# Patient Record
Sex: Female | Born: 1995 | Race: Black or African American | Hispanic: No | Marital: Single | Smoking: Current every day smoker
Health system: Southern US, Community
[De-identification: ages and names within clinical notes are randomized; demographics above are authoritative.]

## PROBLEM LIST (undated history)

## (undated) DIAGNOSIS — F1990 Other psychoactive substance use, unspecified, uncomplicated: Secondary | ICD-10-CM

---

## 2018-02-08 ENCOUNTER — Inpatient Hospital Stay (HOSPITAL_COMMUNITY)
Admission: EM | Admit: 2018-02-08 | Discharge: 2018-03-26 | DRG: 871 | Disposition: A | Discharge status: Home / Self Care | Payer: Self-pay | Attending: Family Medicine | Admitting: Family Medicine

## 2018-02-08 ENCOUNTER — Emergency Department (HOSPITAL_COMMUNITY): Payer: Self-pay

## 2018-02-08 ENCOUNTER — Encounter (HOSPITAL_COMMUNITY): Payer: Self-pay | Admitting: Emergency Medicine

## 2018-02-08 ENCOUNTER — Other Ambulatory Visit: Payer: Self-pay

## 2018-02-08 DIAGNOSIS — I2690 Septic pulmonary embolism without acute cor pulmonale: Secondary | ICD-10-CM | POA: Diagnosis present

## 2018-02-08 DIAGNOSIS — Y92230 Patient room in hospital as the place of occurrence of the external cause: Secondary | ICD-10-CM | POA: Diagnosis not present

## 2018-02-08 DIAGNOSIS — J984 Other disorders of lung: Secondary | ICD-10-CM

## 2018-02-08 DIAGNOSIS — R7689 Other specified abnormal immunological findings in serum: Secondary | ICD-10-CM | POA: Diagnosis present

## 2018-02-08 DIAGNOSIS — Z880 Allergy status to penicillin: Secondary | ICD-10-CM

## 2018-02-08 DIAGNOSIS — D638 Anemia in other chronic diseases classified elsewhere: Secondary | ICD-10-CM | POA: Diagnosis present

## 2018-02-08 DIAGNOSIS — E876 Hypokalemia: Secondary | ICD-10-CM | POA: Diagnosis not present

## 2018-02-08 DIAGNOSIS — I368 Other nonrheumatic tricuspid valve disorders: Secondary | ICD-10-CM

## 2018-02-08 DIAGNOSIS — R042 Hemoptysis: Secondary | ICD-10-CM | POA: Diagnosis not present

## 2018-02-08 DIAGNOSIS — B9561 Methicillin susceptible Staphylococcus aureus infection as the cause of diseases classified elsewhere: Secondary | ICD-10-CM | POA: Diagnosis present

## 2018-02-08 DIAGNOSIS — F1123 Opioid dependence with withdrawal: Secondary | ICD-10-CM | POA: Diagnosis not present

## 2018-02-08 DIAGNOSIS — E875 Hyperkalemia: Secondary | ICD-10-CM | POA: Diagnosis not present

## 2018-02-08 DIAGNOSIS — D696 Thrombocytopenia, unspecified: Secondary | ICD-10-CM | POA: Diagnosis present

## 2018-02-08 DIAGNOSIS — E43 Unspecified severe protein-calorie malnutrition: Secondary | ICD-10-CM

## 2018-02-08 DIAGNOSIS — I079 Rheumatic tricuspid valve disease, unspecified: Secondary | ICD-10-CM | POA: Diagnosis present

## 2018-02-08 DIAGNOSIS — R6520 Severe sepsis without septic shock: Secondary | ICD-10-CM

## 2018-02-08 DIAGNOSIS — Z5320 Procedure and treatment not carried out because of patient's decision for unspecified reasons: Secondary | ICD-10-CM | POA: Diagnosis not present

## 2018-02-08 DIAGNOSIS — T426X5A Adverse effect of other antiepileptic and sedative-hypnotic drugs, initial encounter: Secondary | ICD-10-CM | POA: Diagnosis not present

## 2018-02-08 DIAGNOSIS — Z9114 Patient's other noncompliance with medication regimen: Secondary | ICD-10-CM

## 2018-02-08 DIAGNOSIS — T428X5A Adverse effect of antiparkinsonism drugs and other central muscle-tone depressants, initial encounter: Secondary | ICD-10-CM | POA: Diagnosis not present

## 2018-02-08 DIAGNOSIS — D649 Anemia, unspecified: Secondary | ICD-10-CM | POA: Diagnosis present

## 2018-02-08 DIAGNOSIS — Z6821 Body mass index (BMI) 21.0-21.9, adult: Secondary | ICD-10-CM

## 2018-02-08 DIAGNOSIS — R0602 Shortness of breath: Secondary | ICD-10-CM

## 2018-02-08 DIAGNOSIS — F17210 Nicotine dependence, cigarettes, uncomplicated: Secondary | ICD-10-CM | POA: Diagnosis present

## 2018-02-08 DIAGNOSIS — R7881 Bacteremia: Secondary | ICD-10-CM

## 2018-02-08 DIAGNOSIS — F329 Major depressive disorder, single episode, unspecified: Secondary | ICD-10-CM | POA: Diagnosis not present

## 2018-02-08 DIAGNOSIS — A4101 Sepsis due to Methicillin susceptible Staphylococcus aureus: Principal | ICD-10-CM | POA: Diagnosis present

## 2018-02-08 DIAGNOSIS — R600 Localized edema: Secondary | ICD-10-CM

## 2018-02-08 DIAGNOSIS — R0600 Dyspnea, unspecified: Secondary | ICD-10-CM

## 2018-02-08 DIAGNOSIS — R000 Tachycardia, unspecified: Secondary | ICD-10-CM

## 2018-02-08 DIAGNOSIS — I76 Septic arterial embolism: Secondary | ICD-10-CM

## 2018-02-08 DIAGNOSIS — R768 Other specified abnormal immunological findings in serum: Secondary | ICD-10-CM | POA: Diagnosis present

## 2018-02-08 DIAGNOSIS — N179 Acute kidney failure, unspecified: Secondary | ICD-10-CM

## 2018-02-08 DIAGNOSIS — D72829 Elevated white blood cell count, unspecified: Secondary | ICD-10-CM

## 2018-02-08 DIAGNOSIS — E871 Hypo-osmolality and hyponatremia: Secondary | ICD-10-CM | POA: Diagnosis present

## 2018-02-08 DIAGNOSIS — M609 Myositis, unspecified: Secondary | ICD-10-CM | POA: Diagnosis present

## 2018-02-08 DIAGNOSIS — B1920 Unspecified viral hepatitis C without hepatic coma: Secondary | ICD-10-CM | POA: Diagnosis present

## 2018-02-08 DIAGNOSIS — Z9119 Patient's noncompliance with other medical treatment and regimen: Secondary | ICD-10-CM

## 2018-02-08 DIAGNOSIS — E860 Dehydration: Secondary | ICD-10-CM | POA: Diagnosis present

## 2018-02-08 DIAGNOSIS — J188 Other pneumonia, unspecified organism: Secondary | ICD-10-CM | POA: Diagnosis present

## 2018-02-08 DIAGNOSIS — F1990 Other psychoactive substance use, unspecified, uncomplicated: Secondary | ICD-10-CM | POA: Diagnosis present

## 2018-02-08 DIAGNOSIS — R609 Edema, unspecified: Secondary | ICD-10-CM

## 2018-02-08 DIAGNOSIS — R509 Fever, unspecified: Secondary | ICD-10-CM

## 2018-02-08 DIAGNOSIS — R6521 Severe sepsis with septic shock: Secondary | ICD-10-CM | POA: Diagnosis present

## 2018-02-08 DIAGNOSIS — J189 Pneumonia, unspecified organism: Secondary | ICD-10-CM

## 2018-02-08 DIAGNOSIS — F419 Anxiety disorder, unspecified: Secondary | ICD-10-CM | POA: Diagnosis not present

## 2018-02-08 DIAGNOSIS — G92 Toxic encephalopathy: Secondary | ICD-10-CM | POA: Diagnosis not present

## 2018-02-08 DIAGNOSIS — A419 Sepsis, unspecified organism: Secondary | ICD-10-CM

## 2018-02-08 DIAGNOSIS — I5031 Acute diastolic (congestive) heart failure: Secondary | ICD-10-CM

## 2018-02-08 DIAGNOSIS — Z23 Encounter for immunization: Secondary | ICD-10-CM

## 2018-02-08 DIAGNOSIS — J9601 Acute respiratory failure with hypoxia: Secondary | ICD-10-CM | POA: Diagnosis not present

## 2018-02-08 DIAGNOSIS — N200 Calculus of kidney: Secondary | ICD-10-CM | POA: Diagnosis present

## 2018-02-08 DIAGNOSIS — I959 Hypotension, unspecified: Secondary | ICD-10-CM

## 2018-02-08 HISTORY — DX: Other psychoactive substance use, unspecified, uncomplicated: F19.90

## 2018-02-08 LAB — COMPREHENSIVE METABOLIC PANEL
ALT: 25 U/L (ref 0–44)
AST: 33 U/L (ref 15–41)
Albumin: 2.3 g/dL — ABNORMAL LOW (ref 3.5–5.0)
Alkaline Phosphatase: 137 U/L — ABNORMAL HIGH (ref 38–126)
Anion gap: 15 (ref 5–15)
BUN: 41 mg/dL — ABNORMAL HIGH (ref 6–20)
CO2: 24 mmol/L (ref 22–32)
Calcium: 7.9 mg/dL — ABNORMAL LOW (ref 8.9–10.3)
Chloride: 88 mmol/L — ABNORMAL LOW (ref 98–111)
Creatinine, Ser: 2.41 mg/dL — ABNORMAL HIGH (ref 0.44–1.00)
GFR calc Af Amer: 32 mL/min — ABNORMAL LOW (ref >60)
GFR calc non Af Amer: 27 mL/min — ABNORMAL LOW (ref >60)
Glucose, Bld: 117 mg/dL — ABNORMAL HIGH (ref 70–99)
Potassium: 3.7 mmol/L (ref 3.5–5.1)
Sodium: 127 mmol/L — ABNORMAL LOW (ref 135–145)
Total Bilirubin: 0.9 mg/dL (ref 0.3–1.2)
Total Protein: 7.5 g/dL (ref 6.5–8.1)

## 2018-02-08 LAB — RAPID URINE DRUG SCREEN, HOSP PERFORMED
Amphetamines: NOT DETECTED
Barbiturates: NOT DETECTED
Benzodiazepines: POSITIVE — AB
Cocaine: POSITIVE — AB
Opiates: POSITIVE — AB
Tetrahydrocannabinol: POSITIVE — AB

## 2018-02-08 LAB — URINALYSIS, ROUTINE W REFLEX MICROSCOPIC
Bilirubin Urine: NEGATIVE
Glucose, UA: NEGATIVE mg/dL
Hgb urine dipstick: NEGATIVE
Ketones, ur: NEGATIVE mg/dL
Nitrite: NEGATIVE
Protein, ur: NEGATIVE mg/dL
Specific Gravity, Urine: 1.021 (ref 1.005–1.030)
WBC, UA: >50 WBC/hpf — ABNORMAL HIGH (ref 0–5)
pH: 5 (ref 5.0–8.0)

## 2018-02-08 LAB — LACTIC ACID, PLASMA
Lactic Acid, Venous: 2.1 mmol/L (ref 0.5–1.9)
Lactic Acid, Venous: 2.9 mmol/L (ref 0.5–1.9)
Lactic Acid, Venous: 2.8 mmol/L (ref 0.5–1.9)

## 2018-02-08 LAB — CBC WITH DIFFERENTIAL/PLATELET
Abs Immature Granulocytes: 0.41 10*3/uL — ABNORMAL HIGH (ref 0.00–0.07)
Basophils Absolute: 0.2 10*3/uL — ABNORMAL HIGH (ref 0.0–0.1)
Basophils Relative: 1 %
EOS PCT: 0 %
Eosinophils Absolute: 0.1 10*3/uL (ref 0.0–0.5)
HCT: 32.8 % — ABNORMAL LOW (ref 36.0–46.0)
Hemoglobin: 10.2 g/dL — ABNORMAL LOW (ref 12.0–15.0)
Immature Granulocytes: 2 %
LYMPHS PCT: 6 %
Lymphs Abs: 1.6 10*3/uL (ref 0.7–4.0)
MCH: 24.9 pg — AB (ref 26.0–34.0)
MCHC: 31.1 g/dL (ref 30.0–36.0)
MCV: 80.2 fL (ref 80.0–100.0)
Monocytes Absolute: 1.1 10*3/uL — ABNORMAL HIGH (ref 0.1–1.0)
Monocytes Relative: 4 %
Neutro Abs: 23.2 10*3/uL — ABNORMAL HIGH (ref 1.7–7.7)
Neutrophils Relative %: 87 %
Platelets: 145 10*3/uL — ABNORMAL LOW (ref 150–400)
RBC: 4.09 MIL/uL (ref 3.87–5.11)
RDW: 16.8 % — ABNORMAL HIGH (ref 11.5–15.5)
WBC: 26.6 10*3/uL — ABNORMAL HIGH (ref 4.0–10.5)
nRBC: 0 % (ref 0.0–0.2)

## 2018-02-08 LAB — POC URINE PREG, ED: Preg Test, Ur: NEGATIVE

## 2018-02-08 MED ORDER — SODIUM CHLORIDE 0.9 % IV SOLN
1.0000 g | Freq: Two times a day (BID) | INTRAVENOUS | Status: DC
  Administered 2018-02-09: 1 g via INTRAVENOUS
  Filled 2018-02-08 (×9): qty 1

## 2018-02-08 MED ORDER — CLONIDINE HCL 0.1 MG PO TABS
0.1000 mg | ORAL_TABLET | ORAL | Status: DC | PRN
  Administered 2018-02-09: 0.1 mg via ORAL
  Filled 2018-02-08: qty 1

## 2018-02-08 MED ORDER — FENTANYL CITRATE (PF) 100 MCG/2ML IJ SOLN
100.0000 ug | Freq: Once | INTRAMUSCULAR | Status: AC
  Administered 2018-02-08: 100 ug via INTRAVENOUS
  Filled 2018-02-08: qty 2

## 2018-02-08 MED ORDER — SODIUM CHLORIDE 0.9 % IV BOLUS (SEPSIS)
1000.0000 mL | Freq: Once | INTRAVENOUS | Status: AC
  Administered 2018-02-08: 1000 mL via INTRAVENOUS

## 2018-02-08 MED ORDER — SODIUM CHLORIDE 0.9 % IV BOLUS (SEPSIS)
500.0000 mL | Freq: Once | INTRAVENOUS | Status: AC
  Administered 2018-02-08: 14:00:00 via INTRAVENOUS

## 2018-02-08 MED ORDER — ONDANSETRON HCL 4 MG PO TABS
4.0000 mg | ORAL_TABLET | Freq: Four times a day (QID) | ORAL | Status: DC | PRN
  Administered 2018-03-03 – 2018-03-18 (×7): 4 mg via ORAL
  Filled 2018-02-08 (×8): qty 1

## 2018-02-08 MED ORDER — HEPARIN SODIUM (PORCINE) 5000 UNIT/ML IJ SOLN
5000.0000 [IU] | Freq: Three times a day (TID) | INTRAMUSCULAR | Status: DC
  Administered 2018-02-08 – 2018-02-13 (×12): 5000 [IU] via SUBCUTANEOUS
  Filled 2018-02-08 (×15): qty 1

## 2018-02-08 MED ORDER — ALBUTEROL SULFATE (2.5 MG/3ML) 0.083% IN NEBU: 2.5000 mg | INHALATION_SOLUTION | RESPIRATORY_TRACT | Status: DC | PRN

## 2018-02-08 MED ORDER — SODIUM CHLORIDE 0.9% FLUSH
3.0000 mL | Freq: Two times a day (BID) | INTRAVENOUS | Status: DC
  Administered 2018-02-08 – 2018-03-18 (×47): 3 mL via INTRAVENOUS

## 2018-02-08 MED ORDER — SODIUM CHLORIDE 0.9 % IV BOLUS (SEPSIS)
250.0000 mL | Freq: Once | INTRAVENOUS | Status: AC
  Administered 2018-02-08: 250 mL via INTRAVENOUS

## 2018-02-08 MED ORDER — SODIUM CHLORIDE 0.9 % IV BOLUS
1000.0000 mL | Freq: Once | INTRAVENOUS | Status: AC
  Administered 2018-02-08: 1000 mL via INTRAVENOUS

## 2018-02-08 MED ORDER — IPRATROPIUM-ALBUTEROL 0.5-2.5 (3) MG/3ML IN SOLN
3.0000 mL | Freq: Four times a day (QID) | RESPIRATORY_TRACT | Status: DC
  Administered 2018-02-08: 3 mL via RESPIRATORY_TRACT
  Filled 2018-02-08 (×2): qty 3

## 2018-02-08 MED ORDER — SODIUM CHLORIDE 0.9 % IV SOLN
1.0000 g | Freq: Once | INTRAVENOUS | Status: AC
  Administered 2018-02-08: 1 g via INTRAVENOUS
  Filled 2018-02-08: qty 1

## 2018-02-08 MED ORDER — SENNOSIDES-DOCUSATE SODIUM 8.6-50 MG PO TABS
1.0000 | ORAL_TABLET | Freq: Every evening | ORAL | Status: DC | PRN
  Filled 2018-02-08: qty 1

## 2018-02-08 MED ORDER — GADOBUTROL 1 MMOL/ML IV SOLN
6.0000 mL | Freq: Once | INTRAVENOUS | Status: AC | PRN
  Administered 2018-02-08: 6 mL via INTRAVENOUS

## 2018-02-08 MED ORDER — PNEUMOCOCCAL VAC POLYVALENT 25 MCG/0.5ML IJ INJ
0.5000 mL | INJECTION | INTRAMUSCULAR | Status: AC
  Administered 2018-02-09: 0.5 mL via INTRAMUSCULAR
  Filled 2018-02-08: qty 0.5

## 2018-02-08 MED ORDER — IPRATROPIUM-ALBUTEROL 0.5-2.5 (3) MG/3ML IN SOLN
3.0000 mL | Freq: Two times a day (BID) | RESPIRATORY_TRACT | Status: DC
  Administered 2018-02-09 (×2): 3 mL via RESPIRATORY_TRACT
  Filled 2018-02-08 (×3): qty 3

## 2018-02-08 MED ORDER — HYDROMORPHONE HCL 1 MG/ML IJ SOLN
0.5000 mg | INTRAMUSCULAR | Status: DC | PRN
  Administered 2018-02-08: 0.5 mg via INTRAVENOUS
  Filled 2018-02-08: qty 1

## 2018-02-08 MED ORDER — ONDANSETRON HCL 4 MG/2ML IJ SOLN
4.0000 mg | Freq: Four times a day (QID) | INTRAMUSCULAR | Status: DC | PRN
  Administered 2018-03-05 – 2018-03-21 (×18): 4 mg via INTRAVENOUS
  Filled 2018-02-08 (×22): qty 2

## 2018-02-08 MED ORDER — ACETAMINOPHEN 650 MG RE SUPP: 650.0000 mg | Freq: Four times a day (QID) | RECTAL | Status: DC | PRN

## 2018-02-08 MED ORDER — VANCOMYCIN HCL IN DEXTROSE 750-5 MG/150ML-% IV SOLN: 750.0000 mg | INTRAVENOUS | Status: DC

## 2018-02-08 MED ORDER — INFLUENZA VAC SPLIT QUAD 0.5 ML IM SUSY
0.5000 mL | PREFILLED_SYRINGE | INTRAMUSCULAR | Status: DC
  Filled 2018-02-08: qty 0.5

## 2018-02-08 MED ORDER — ACETAMINOPHEN 500 MG PO TABS
1000.0000 mg | ORAL_TABLET | Freq: Four times a day (QID) | ORAL | Status: DC | PRN
  Administered 2018-02-08 – 2018-03-26 (×42): 1000 mg via ORAL
  Filled 2018-02-08 (×46): qty 2

## 2018-02-08 MED ORDER — VANCOMYCIN HCL IN DEXTROSE 1-5 GM/200ML-% IV SOLN
1000.0000 mg | Freq: Once | INTRAVENOUS | Status: AC
  Administered 2018-02-08: 1000 mg via INTRAVENOUS
  Filled 2018-02-08: qty 200

## 2018-02-08 MED ORDER — HYDROMORPHONE HCL 1 MG/ML IJ SOLN
1.0000 mg | INTRAMUSCULAR | Status: DC | PRN
  Administered 2018-02-08 – 2018-02-09 (×5): 1 mg via INTRAVENOUS
  Filled 2018-02-08 (×5): qty 1

## 2018-02-08 MED ORDER — SODIUM CHLORIDE 0.9 % IV BOLUS
500.0000 mL | Freq: Once | INTRAVENOUS | Status: AC
  Administered 2018-02-08: 500 mL via INTRAVENOUS

## 2018-02-08 MED ORDER — SODIUM CHLORIDE 0.9 % IV SOLN
INTRAVENOUS | Status: AC
  Administered 2018-02-08: 18:00:00 via INTRAVENOUS

## 2018-02-08 NOTE — ED Notes (Signed)
Attempted to call report, nurse in report and was told that they would call back

## 2018-02-08 NOTE — ED Provider Notes (Signed)
[facility] EMERGENCY DEPARTMENT Provider Note   CSN: [id number] Arrival date & time: 02/08/18  1130     History   Chief Complaint No chief complaint on file.   HPI Amy Mcknight is a 23 y.o. female.  HPI  23 year old female with no past medical or surgical history who presents with a complaint of low back pain which started 2 days ago.  She first noticed the pain upon awakening in the morning but states that it has persisted, become worsened and does not depend on her position.  It is definitely worse when she is trying to sit because her tailbone is excruciatingly painful but her lower back and especially the right lower back into the sacrum is extremely tender and hurts no matter what her position.  She denies any numbness or weakness but states that when she tries to move either of her legs she has significant and severe pain as well.  She denies any numbness, there is no weakness, there is no problem using her arms and she denies any abdominal pain.  She does report that she urinates 3 or 4 times per evening and reports that this is fairly common for her.  She does endorse upon questioning that she has a history of IV drug use and last use approximately 1 month ago, she continues to use drugs in the form of snorting opiates.  He denies any complications of her drug use in the past.   History reviewed. No pertinent past medical history.  Patient Active Problem List   Diagnosis Date Noted  . Sepsis (HCC) 02/08/2018  . Cavitary pneumonia 02/08/2018  . AKI (acute kidney injury) (HCC) 02/08/2018  . Substance use disorder 02/08/2018  . Hyponatremia 02/08/2018    History reviewed. No pertinent surgical history.   OB History   No obstetric history on file.      Home Medications    Prior to Admission medications   Not on File    Family History No family history on file.  Social History Social History   Tobacco Use  . Smoking status: Current Every Day Smoker   Packs/day: 0.50    Types: Cigarettes  . Smokeless tobacco: Never Used  Substance Use Topics  . Alcohol use: Not Currently  . Drug use: Not Currently     Allergies   Penicillins   Review of Systems Review of Systems  All other systems reviewed and are negative.    Physical Exam Updated Vital Signs BP 96/64   Pulse (!) 116   Temp (!) 97 F (36.1 C) (Oral)   Resp (!) 33   Ht 1.6 m (5\' 3" )   Wt 54.4 kg   SpO2 100%   BMI 21.26 kg/m   Physical Exam Vitals signs and nursing note reviewed.  Constitutional:      Appearance: She is well-developed.     Comments: Uncomfortable appearing  HENT:     Head: Normocephalic and atraumatic.     Mouth/Throat:     Pharynx: No oropharyngeal exudate.  Eyes:     General: No scleral icterus.       Right eye: No discharge.        Left eye: No discharge.     Conjunctiva/sclera: Conjunctivae normal.     Pupils: Pupils are equal, round, and reactive to light.  Neck:     Musculoskeletal: Normal range of motion and neck supple.     Thyroid: No thyromegaly.     Vascular: No JVD.  Cardiovascular:  Rate and Rhythm: Regular rhythm. Tachycardia present.     Heart sounds: Normal heart sounds. No murmur. No friction rub. No gallop.      Comments: Tachycardic to 110, pulses are palpable at the radial arteries Pulmonary:     Effort: Pulmonary effort is normal. No respiratory distress.     Breath sounds: Normal breath sounds. No wheezing or rales.  Abdominal:     General: Bowel sounds are normal. There is no distension.     Palpations: Abdomen is soft. There is no mass.     Tenderness: There is abdominal tenderness ( Minimal tenderness in the upper abdomen, no guarding, no lower abdominal tenderness).  Musculoskeletal: Normal range of motion.        General: Tenderness present.     Comments: She has normal range of motion to all 4 extremities, she is barely able to straight leg raise secondary to back pain, she has tenderness which is quite  significant over the coccyx and sacrum and lower lumbar spine but also into the right SI joint as well.  Lymphadenopathy:     Cervical: No cervical adenopathy.  Skin:    General: Skin is warm and dry.     Findings: No erythema or rash.     Comments: There is no rash overlying the lower back  Neurological:     Mental Status: She is alert.     Coordination: Coordination normal.     Comments: The patient is able to straight leg raise several inches off the bed bilaterally but limited by pain, she has normal sensation bilaterally diffusely to the lower extremities.  Psychiatric:        Behavior: Behavior normal.      ED Treatments / Results  Labs (all labs ordered are listed, but only abnormal results are displayed) Labs Reviewed  LACTIC ACID, PLASMA - Abnormal; Notable for the following components:      Result Value   Lactic Acid, Venous 2.1 (*)    All other components within normal limits  COMPREHENSIVE METABOLIC PANEL - Abnormal; Notable for the following components:   Sodium 127 (*)    Chloride 88 (*)    Glucose, Bld 117 (*)    BUN 41 (*)    Creatinine, Ser 2.41 (*)    Calcium 7.9 (*)    Albumin 2.3 (*)    Alkaline Phosphatase 137 (*)    GFR calc non Af Amer 27 (*)    GFR calc Af Amer 32 (*)    All other components within normal limits  CBC WITH DIFFERENTIAL/PLATELET - Abnormal; Notable for the following components:   WBC 26.6 (*)    Hemoglobin 10.2 (*)    HCT 32.8 (*)    MCH 24.9 (*)    RDW 16.8 (*)    Platelets 145 (*)    Neutro Abs 23.2 (*)    Monocytes Absolute 1.1 (*)    Basophils Absolute 0.2 (*)    Abs Immature Granulocytes 0.41 (*)    All other components within normal limits  URINALYSIS, ROUTINE W REFLEX MICROSCOPIC - Abnormal; Notable for the following components:   Color, Urine AMBER (*)    APPearance CLOUDY (*)    Leukocytes, UA MODERATE (*)    WBC, UA >50 (*)    Bacteria, UA RARE (*)    All other components within normal limits  RAPID URINE DRUG  SCREEN, HOSP PERFORMED - Abnormal; Notable for the following components:   Opiates POSITIVE (*)    Cocaine POSITIVE (*)  Benzodiazepines POSITIVE (*)    Tetrahydrocannabinol POSITIVE (*)    All other components within normal limits  CULTURE, BLOOD (ROUTINE X 2)  CULTURE, BLOOD (ROUTINE X 2)  URINE CULTURE  LACTIC ACID, PLASMA  POC URINE PREG, ED    EKG EKG Interpretation  Date/Time:  Thursday February 08 2018 12:25:24 EST Ventricular Rate:  111 PR Interval:    QRS Duration: 87 QT Interval:  333 QTC Calculation: 453 R Axis:   66 Text Interpretation:  Sinus tachycardia No old tracing to compare Confirmed by Eber Hong ([id number]) on 02/08/2018 12:39:06 PM   Radiology Ct Abdomen Pelvis Wo Contrast  Result Date: 02/08/2018 CLINICAL DATA:  Abdominal pain and fever EXAM: CT ABDOMEN AND PELVIS WITHOUT CONTRAST TECHNIQUE: Multidetector CT imaging of the abdomen and pelvis was performed following the standard protocol without oral or IV contrast. COMPARISON:  None. FINDINGS: Lower chest: There is airspace consolidation throughout the lung bases with several areas of cavitation, likely developing lung abscesses. Several nodular opacities are noted associated with these areas of cavitary consolidation, likely septic emboli. Hepatobiliary: Liver measures 20.5 cm in length. No focal liver lesions are appreciable on this noncontrast enhanced study. The gallbladder is borderline dilated without wall thickening. There is no biliary duct dilatation. Pancreas: No pancreatic mass or inflammatory focus. Spleen: Spleen measures 14.1 x 10.9 x 5.1 cm with a measured splenic volume of 392 cubic cm. No focal splenic lesions are evident. Adrenals/Urinary Tract: Adrenals appear unremarkable bilaterally. 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 No renal mass evident. No hydronephrosis on either side. There is a suspected developing staghorn calculus on the left with somewhat amorphous calcification involving a  lower pole calyx on the left extending into the left renal pelvis, best appreciated on coronal imaging. This developing staghorn calculus measures 3.7 x 1.0 cm. No ureteral calculi are evident. Urinary bladder is decompressed. No urinary bladder wall thickening is appreciable with essentially empty bladder. Stomach/Bowel: There is fluid throughout most small bowel loops. There is no appreciable bowel wall or mesenteric thickening. No evident bowel obstruction. No free air or portal venous air is evident. Vascular/Lymphatic: There is no abdominal aortic aneurysm. No vascular lesions are evident on this noncontrast enhanced study. Note that the attenuation throughout the vascular structures appear slightly diminished which raises question of anemia. No adenopathy is appreciable in abdomen or pelvis. Reproductive: Uterus is anteverted. No pelvic masses evident. There is a slight degree of free fluid in the cul-de-sac region. Other: Appendix is not well seen. There is no periappendiceal region inflammation on this study. No abscess is seen in the abdomen or pelvis. There is no ascites beyond the slight fluid noted in the cul-de-sac region. Musculoskeletal: There are no blastic or lytic bone lesions. No intramuscular or abdominal wall lesions are appreciable. IMPRESSION: Comment: Note that paucity of fat makes assessment in the abdomen and pelvis somewhat less than optimal. 1. Areas of cavitary pneumonia, likely with developing lung abscesses, in the right lower lobe. Areas of consolidation elsewhere with probable septic emboli associated. 2. Prominent liver and spleen. No focal liver and splenic lesions evident. 3. There is evidence of a degree of nephrocalcinosis. Amorphous calcification in a portion of the left renal pelvis and an inferior left calyx suggest developing staghorn calculus. No hydronephrosis on either side. No well-defined ureteral calculi. 4. Fluid in most loops of small bowel. Suspect a degree of ileus  or enteritis. No bowel obstruction. No abscess evident in the abdomen or pelvis. 5. Small amount of free fluid  in the cul-de-sac may be upper physiologic. Electronically Signed   By: Bretta Bang III M.D.   On: 02/08/2018 14:47   Dg Chest 1 View  Result Date: 02/08/2018 CLINICAL DATA:  Hypotension and tachycardia. Smoking history. Asthma. EXAM: CHEST  1 VIEW COMPARISON:  None. FINDINGS: Heart size is within normal limits. Mild prominence of the LEFT hilum. Patchy opacities bilaterally, most prominent at the RIGHT lung base, majority of which have central lucencies suggest cavitary lesions. IMPRESSION: 1. Patchy opacities bilaterally, majority of which have central lucencies suggest cavitary masses/infections. Corresponding cavitary consolidations noted at the lung bases of a CT abdomen performed at the time of today's chest x-ray. Recommend CT chest with contrast for complete evaluation. 2. Questionable prominence of the LEFT hilum, additional cavitary mass/consolidation versus central bronchiectasis. Electronically Signed   By: Bary Richard M.D.   On: 02/08/2018 14:48   Mr Lumbar Spine W Wo Contrast  Result Date: 02/08/2018 CLINICAL DATA:  Acute onset of back pain beginning 2 days ago. Study was ordered with contrast but the patient refused. EXAM: MRI LUMBAR SPINE WITHOUT CONTRAST TECHNIQUE: Multiplanar, multisequence MR imaging of the lumbar spine was performed. No intravenous contrast was administered. COMPARISON:  None. FINDINGS: Segmentation:  5 lumbar type vertebral bodies. Alignment:  Normal Vertebrae:  Normal Conus medullaris and cauda equina: Conus extends to the L1 level. Conus and cauda equina appear normal. Paraspinal and other soft tissues: There is lower paraspinous muscle edema as might be seen with a muscular strain. This appears symmetric from right to left. Disc levels: No abnormality at L3-4 or above. L4-5: Very minimal disc bulge.  No stenosis or neural compression. L5-S1: Very  minimal disc bulge.  No stenosis or neural compression. IMPRESSION: Minimal disc bulges at L4-5 and L5-S1. No stenosis or neural compression. Paraspinous muscle edema on both sides in the lower lumbar region as might be seen with a muscular strain. This is nonspecific. The differential diagnosis does include infection, but the symmetric nature would be unusual. The patient refused contrast administration. Electronically Signed   By: Paulina Fusi M.D.   On: 02/08/2018 13:56    Procedures .Critical Care Performed by: Eber Hong, MD Authorized by: Eber Hong, MD   Critical care provider statement:    Critical care time (minutes):  35   Critical care time was exclusive of:  Separately billable procedures and treating other patients and teaching time   Critical care was necessary to treat or prevent imminent or life-threatening deterioration of the following conditions:  Shock and sepsis   Critical care was time spent personally by me on the following activities:  Blood draw for specimens, development of treatment plan with patient or surrogate, discussions with consultants, evaluation of patient's response to treatment, examination of patient, obtaining history from patient or surrogate, ordering and performing treatments and interventions, ordering and review of laboratory studies, ordering and review of radiographic studies, pulse oximetry, re-evaluation of patient's condition and review of old charts   (including critical care time)  Medications Ordered in ED Medications  vancomycin (VANCOCIN) IVPB 1000 mg/200 mL premix (1,000 mg Intravenous New Bag/Given 02/08/18 1455)  meropenem (MERREM) 1 g in sodium chloride 0.9 % 100 mL IVPB (has no administration in time range)  sodium chloride 0.9 % bolus 1,000 mL (1,000 mLs Intravenous New Bag/Given 02/08/18 1353)    And  sodium chloride 0.9 % bolus 500 mL ( Intravenous New Bag/Given 02/08/18 1411)    And  sodium chloride 0.9 % bolus 250 mL (  0 mLs  Intravenous Stopped 02/08/18 1506)  fentaNYL (SUBLIMAZE) injection 100 mcg (100 mcg Intravenous Given 02/08/18 1353)  gadobutrol (GADAVIST) 1 MMOL/ML injection 6 mL (6 mLs Intravenous Contrast Given 02/08/18 1336)  meropenem (MERREM) 1 g in sodium chloride 0.9 % 100 mL IVPB (0 g Intravenous Stopped 02/08/18 1455)     Initial Impression / Assessment and Plan / ED Course  I have reviewed the triage vital signs and the nursing notes.  Pertinent labs & imaging results that were available during my care of the patient were reviewed by me and considered in my medical decision making (see chart for details).  Clinical Course as of Feb 08 1510  Thu Feb 08, 2018  1442 I am very concerned about this patient, she has a leftward shift and her leukocytosis she has an acute kidney injury with a creatinine of 2.4 and a BUN of 41 signs of pyelonephritis on her urinalysis and has evidence of multiple infiltrates on her chest x-ray which looks like she likely has septic emboli.  Again this raises suspicion for endocarditis and the fact that she is hypotensive and tachycardic raises the suspicion for sepsis.  She has been given antibiotics including Rocephin and now I have added on vancomycin to cover for MRSA.  I will discuss with the hospitalist.  The patient is critically ill   [BM]    Clinical Course User Index [BM] Eber Hong, MD   Back pain - there is no signs of fever but she is very tender and having low BP and mild tachycardia and in the presence of the low BP and hx of IVDU will need MRI - labs and cultures pending incase there is an element of sepsis - though she is very small and may have low BP at baseline - err on side of cautions at this time - she has some track marks on her arm on the L - but no abscesses, no signs of a pilonidal and no weakness.  She has no murmur or other signs of endocarditis.  Unfortunately the patient has multiple L areas of what appears to be pulmonary abscesses likely septic  emboli, she has evidence of urinary tract infection, cultures have been sent, lactic acid was elevated over 2 and though severe leukocytosis of over 26,000 with tachycardia and hypotension further suggest a critical nature of the patient's illness.  I discussed the case with Dr. Allena Katz with the hospitalist service who will coordinate admission.  The patient is critically ill  Final Clinical Impressions(s) / ED Diagnoses   Final diagnoses:  Septic shock (HCC)  Septic embolism (HCC)  Community acquired pneumonia, unspecified laterality  AKI (acute kidney injury) (HCC)      Eber Hong, MD 02/08/18 [phone]

## 2018-02-08 NOTE — H&P (Signed)
History and Physical    Amy Mcknight DVV:[id number] DOB: 11-Oct-1995 DOA: 02/08/2018  PCP: Patient, No Pcp Per  Patient coming from: Home  I have personally briefly reviewed patient's [facility]  Chief Complaint: Low back pain  HPI: Amy BattlesShan Rohr is a 23 y.o. female with medical history significant for substance use disorder who presents to the ED with 2 days of right lower back pain.  Patient states symptoms began with acute onset while at rest.  Symptoms have been persistent.  She has noted associated diaphoresis, chills, shortness of breath, cough productive of brown sputum, and right chest wall pain.  She denies any rashes or obvious skin changes.  She reports good urine output without dysuria.  She denies any abdominal pain, diarrhea, or constipation.  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  She continues to use recreational drugs by snorting nasally.  She denies any alcohol use.  She reports a history of withdrawal from opiates in the past.  ED Course:  Initial vitals showed BP 84/61, pulse 114, RR 18, temp 97 Fahrenheit, SPO2 100% on room air.  Labs are notable for WBC 26.6, hemoglobin 10.2, platelets 145, sodium 127, potassium 3.7, BUN 41, creatinine 2.41, lactic acid 2.1, negative point-of-care urine pregnancy test.  UDS was positive for opiates, cocaine, benzodiazepines, THC.  Urinalysis was negative for nitrites, positive for moderate leukocytes, with rare bacteria on microscopy.  Portable chest x-ray showed patchy bilateral opacities with central lucencies suggestive of cavitary masses/infection.  CT abdomen/pelvis without contrast showed multiple areas of cavitary pneumonia with possible developing lobe abscess in the right lower lobe and areas of consolidation elsewhere possibly associated with septic emboli.  Calcification of the left renal and inferior left calyx was noted suggestive of developing staghorn calculus.  MRI lumbar spine  without contrast showed minimal disc bulges at L4-5 and L5-S1 without stenosis or neural compression.  Paraspinous muscle edema seen in the lower lumbar region.  Patient was given 1.75 L normal saline and started on IV vancomycin and meropenem.  The hospitalist service was consulted to admit for further evaluation and management.  Review of Systems: As per HPI otherwise 10 point review of systems negative.    Past Medical History:  Diagnosis Date  . Substance use disorder     History reviewed. No pertinent surgical history.   reports that she has been smoking cigarettes. She has been smoking about 0.50 packs per day. She has never used smokeless tobacco. She reports previous alcohol use. She reports current drug use. Drug: Heroin.  Allergies  Allergen Reactions  . Penicillins Hives    Family History  Problem Relation Age of Onset  . Diabetes Mother      Prior to Admission medications   Not on File    Physical Exam: Vitals:   02/08/18 1500 02/08/18 1520 02/08/18 1527 02/08/18 1530  BP: 96/64   98/67  Pulse:  (!) 110 (!) 112   Resp: (!) 33 (!) 26 (!) 26 (!) 27  Temp:      TempSrc:      SpO2:  100% 98%   Weight:      Height:        Constitutional: Thin woman resting in bed in the left lateral decubitus position, calm, appears somewhat comfortable Eyes: PERRL, lids and conjunctivae normal ENMT: Mucous membranes are dry. Posterior pharynx clear of any exudate or lesions. Normal dentition.  Neck: normal, supple, no masses. Respiratory: clear to  auscultation bilaterally, no wheezing, no crackles. Normal respiratory effort. No accessory muscle use.  Cardiovascular: Regular rate and rhythm, no murmurs / rubs / gallops. No extremity edema. Abdomen: no tenderness, no masses palpated. No hepatosplenomegaly. Bowel sounds positive.  Musculoskeletal: no clubbing / cyanosis. No joint deformity upper and lower extremities. No contractures. Normal muscle tone.  Skin: Questionable  splinter hemorrhage fingernails, no rashes, lesions, ulcers. No induration. Neurologic: CN 2-12 grossly intact. Sensation intact,  Strength 5/5 in all 4.  Psychiatric: Alert and oriented x 3. Normal mood.     Labs on Admission: I have personally reviewed following labs and imaging studies  CBC: Recent Labs  Lab 02/08/18 1306  WBC 26.6*  NEUTROABS 23.2*  HGB 10.2*  HCT 32.8*  MCV 80.2  PLT 145*   Basic Metabolic Panel: Recent Labs  Lab 02/08/18 1306  NA 127*  K 3.7  CL 88*  CO2 24  GLUCOSE 117*  BUN 41*  CREATININE 2.41*  CALCIUM 7.9*   GFR: Estimated Creatinine Clearance: 30 mL/min (A) (by C-G formula based on SCr of 2.41 mg/dL (H)). Liver Function Tests: Recent Labs  Lab 02/08/18 1306  AST 33  ALT 25  ALKPHOS 137*  BILITOT 0.9  PROT 7.5  ALBUMIN 2.3*   No results for input(s): LIPASE, AMYLASE in the last 168 hours. No results for input(s): AMMONIA in the last 168 hours. Coagulation Profile: No results for input(s): INR, PROTIME in the last 168 hours. Cardiac Enzymes: No results for input(s): CKTOTAL, CKMB, CKMBINDEX, TROPONINI in the last 168 hours. BNP (last 3 results) No results for input(s): PROBNP in the last 8760 hours. HbA1C: No results for input(s): HGBA1C in the last 72 hours. CBG: No results for input(s): GLUCAP in the last 168 hours. Lipid Profile: No results for input(s): CHOL, HDL, LDLCALC, TRIG, CHOLHDL, LDLDIRECT in the last 72 hours. Thyroid Function Tests: No results for input(s): TSH, T4TOTAL, FREET4, T3FREE, THYROIDAB in the last 72 hours. Anemia Panel: No results for input(s): VITAMINB12, FOLATE, FERRITIN, TIBC, IRON, RETICCTPCT in the last 72 hours. Urine analysis:    Component Value Date/Time   COLORURINE AMBER (A) 02/08/2018 1319   APPEARANCEUR CLOUDY (A) 02/08/2018 1319   LABSPEC 1.021 02/08/2018 1319   PHURINE 5.0 02/08/2018 1319   GLUCOSEU NEGATIVE 02/08/2018 1319   HGBUR NEGATIVE 02/08/2018 1319   BILIRUBINUR NEGATIVE  02/08/2018 1319   KETONESUR NEGATIVE 02/08/2018 1319   PROTEINUR NEGATIVE 02/08/2018 1319   NITRITE NEGATIVE 02/08/2018 1319   LEUKOCYTESUR MODERATE (A) 02/08/2018 1319    Radiological Exams on Admission: Ct Abdomen Pelvis Wo Contrast  Result Date: 02/08/2018 CLINICAL DATA:  Abdominal pain and fever EXAM: CT ABDOMEN AND PELVIS WITHOUT CONTRAST TECHNIQUE: Multidetector CT imaging of the abdomen and pelvis was performed following the standard protocol without oral or IV contrast. COMPARISON:  None. FINDINGS: Lower chest: There is airspace consolidation throughout the lung bases with several areas of cavitation, likely developing lung abscesses. Several nodular opacities are noted associated with these areas of cavitary consolidation, likely septic emboli. Hepatobiliary: Liver measures 20.5 cm in length. No focal liver lesions are appreciable on this noncontrast enhanced study. The gallbladder is borderline dilated without wall thickening. There is no biliary duct dilatation. Pancreas: No pancreatic mass or inflammatory focus. Spleen: Spleen measures 14.1 x 10.9 x 5.1 cm with a measured splenic volume of 392 cubic cm. No focal splenic lesions are evident. Adrenals/Urinary Tract: Adrenals appear unremarkable bilaterally. 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 No renal mass evident. No hydronephrosis on  either side. There is a suspected developing staghorn calculus on the left with somewhat amorphous calcification involving a lower pole calyx on the left extending into the left renal pelvis, best appreciated on coronal imaging. This developing staghorn calculus measures 3.7 x 1.0 cm. No ureteral calculi are evident. Urinary bladder is decompressed. No urinary bladder wall thickening is appreciable with essentially empty bladder. Stomach/Bowel: There is fluid throughout most small bowel loops. There is no appreciable bowel wall or mesenteric thickening. No evident bowel obstruction. No free air or portal  venous air is evident. Vascular/Lymphatic: There is no abdominal aortic aneurysm. No vascular lesions are evident on this noncontrast enhanced study. Note that the attenuation throughout the vascular structures appear slightly diminished which raises question of anemia. No adenopathy is appreciable in abdomen or pelvis. Reproductive: Uterus is anteverted. No pelvic masses evident. There is a slight degree of free fluid in the cul-de-sac region. Other: Appendix is not well seen. There is no periappendiceal region inflammation on this study. No abscess is seen in the abdomen or pelvis. There is no ascites beyond the slight fluid noted in the cul-de-sac region. Musculoskeletal: There are no blastic or lytic bone lesions. No intramuscular or abdominal wall lesions are appreciable. IMPRESSION: Comment: Note that paucity of fat makes assessment in the abdomen and pelvis somewhat less than optimal. 1. Areas of cavitary pneumonia, likely with developing lung abscesses, in the right lower lobe. Areas of consolidation elsewhere with probable septic emboli associated. 2. Prominent liver and spleen. No focal liver and splenic lesions evident. 3. There is evidence of a degree of nephrocalcinosis. Amorphous calcification in a portion of the left renal pelvis and an inferior left calyx suggest developing staghorn calculus. No hydronephrosis on either side. No well-defined ureteral calculi. 4. Fluid in most loops of small bowel. Suspect a degree of ileus or enteritis. No bowel obstruction. No abscess evident in the abdomen or pelvis. 5. Small amount of free fluid in the cul-de-sac may be upper physiologic. Electronically Signed   By: Bretta Bang III M.D.   On: 02/08/2018 14:47   Dg Chest 1 View  Result Date: 02/08/2018 CLINICAL DATA:  Hypotension and tachycardia. Smoking history. Asthma. EXAM: CHEST  1 VIEW COMPARISON:  None. FINDINGS: Heart size is within normal limits. Mild prominence of the LEFT hilum. Patchy opacities  bilaterally, most prominent at the RIGHT lung base, majority of which have central lucencies suggest cavitary lesions. IMPRESSION: 1. Patchy opacities bilaterally, majority of which have central lucencies suggest cavitary masses/infections. Corresponding cavitary consolidations noted at the lung bases of a CT abdomen performed at the time of today's chest x-ray. Recommend CT chest with contrast for complete evaluation. 2. Questionable prominence of the LEFT hilum, additional cavitary mass/consolidation versus central bronchiectasis. Electronically Signed   By: Bary Richard M.D.   On: 02/08/2018 14:48   Mr Lumbar Spine W Wo Contrast  Result Date: 02/08/2018 CLINICAL DATA:  Acute onset of back pain beginning 2 days ago. Study was ordered with contrast but the patient refused. EXAM: MRI LUMBAR SPINE WITHOUT CONTRAST TECHNIQUE: Multiplanar, multisequence MR imaging of the lumbar spine was performed. No intravenous contrast was administered. COMPARISON:  None. FINDINGS: Segmentation:  5 lumbar type vertebral bodies. Alignment:  Normal Vertebrae:  Normal Conus medullaris and cauda equina: Conus extends to the L1 level. Conus and cauda equina appear normal. Paraspinal and other soft tissues: There is lower paraspinous muscle edema as might be seen with a muscular strain. This appears symmetric from right to left.  Disc levels: No abnormality at L3-4 or above. L4-5: Very minimal disc bulge.  No stenosis or neural compression. L5-S1: Very minimal disc bulge.  No stenosis or neural compression. IMPRESSION: Minimal disc bulges at L4-5 and L5-S1. No stenosis or neural compression. Paraspinous muscle edema on both sides in the lower lumbar region as might be seen with a muscular strain. This is nonspecific. The differential diagnosis does include infection, but the symmetric nature would be unusual. The patient refused contrast administration. Electronically Signed   By: Paulina FusiMark  Shogry M.D.   On: 02/08/2018 13:56    EKG:  Independently reviewed.  Sinus tachycardia without acute ischemic changes.  No prior for comparison.  Assessment/Plan Principal Problem:   Sepsis (HCC) Active Problems:   Cavitary pneumonia   AKI (acute kidney injury) (HCC)   Substance use disorder   Hyponatremia  Amy BattlesShan Dedominicis is a 23 y.o. female with medical history significant for substance use disorder who is admitted with sepsis associated with multiple areas of cavitary pneumonia suspected secondary to septic emboli.  Sepsis with areas of cavitary pneumonia from likely septic emboli: High suspicion for infective endocarditis. -Continue IV vancomycin and meropenem (patient reports penicillin allergy) -Follow-up blood cultures -Obtain echocardiogram -if negative [facility] -Continue maintenance IV fluids  Acute kidney injury: No prior renal function for comparison.  Suspect secondary to sepsis.  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 -Continue IV fluids as above -Monitor labs  Hyponatremia: Likely secondary to dehydration from above.  Will continue IV fluids and recheck labs in a.m.  Substance use disorder: Patient admits to continue nasal opiate use, with last injection use about 1 month ago.  UDS is positive for opiates, cocaine, benzodiazepines, THC. -COWS -Social work consult for ongoing substance use   DVT prophylaxis: subq heparin Code Status: Full code Family Communication: No family at bedside on admission Disposition Plan: Pending continued management of sepsis Consults called: None Admission status: Inpatient   Darreld McleanVishal Tenleigh Byer MD [facility] Pager [phone]  If 7PM-7AM, please contact night-coverage [email or web address]  02/08/2018, 3:53 PM

## 2018-02-08 NOTE — ED Notes (Signed)
CRITICAL VALUE ALERT  Critical Value:  Lactic acid 2.8  Date & Time Notied:  02/08/18 2030  Provider Notified: Terrilee Croak   Orders Received/Actions taken: Dr. Allena Katz to place new orders

## 2018-02-08 NOTE — ED Notes (Signed)
CRITICAL VALUE ALERT  Critical Value:  Lactic acic 2.9  Date & Time Notied:  02/08/18 1625  Provider Notified: Terrilee Croak.   Orders Received/Actions taken: new orders received

## 2018-02-08 NOTE — Progress Notes (Signed)
Attempted to place pt on BIPAP. Pt states that she's scared and it's taking her breath away

## 2018-02-08 NOTE — Progress Notes (Addendum)
Pharmacy Antibiotic Note  Amy Mcknight is a 23 y.o. female admitted on 02/08/2018 with UTI.  Pharmacy has been consulted for meropenem dosing.  Plan: Vancomycin 750 mg IV every 24 hours. Meropenem 1000 mg IV every 12 hours.  Monitor labs, c/s, and patient improvement.  Height: 5\' 3"  (160 cm) Weight: 120 lb (54.4 kg) IBW/kg (Calculated) : 52.4  Temp (24hrs), Avg:97 F (36.1 C), Min:97 F (36.1 C), Max:97 F (36.1 C)  Recent Labs  Lab 02/08/18 1306  WBC 26.6*  CREATININE 2.41*  LATICACIDVEN 2.1*    Estimated Creatinine Clearance: 30 mL/min (A) (by C-G formula based on SCr of 2.41 mg/dL (H)).    Allergies  Allergen Reactions  . Penicillins Hives    Antimicrobials this admission: Merrem 2/6 >>  Vanco 2/6 >>  Dose adjustments this admission: Merrem  Microbiology results: 2/6 BCx: pending 2/6 UCx: pending   Thank you for allowing pharmacy to be a part of this patient's care.  Tad Moore 02/08/2018 3:03 PM

## 2018-02-08 NOTE — ED Triage Notes (Signed)
Onset 2 days ago, woke up with back pain, taking motrin without relief, states it hurts to walk.

## 2018-02-08 NOTE — ED Notes (Signed)
Patient transported to CT 

## 2018-02-08 NOTE — ED Notes (Signed)
Attempted x 2 for IV access without success.  

## 2018-02-08 NOTE — ED Notes (Signed)
Pt requesting BiPap due to her sob.

## 2018-02-08 NOTE — ED Notes (Signed)
CRITICAL VALUE ALERT  Critical Value:  Lactic acid 2.1  Date & Time Notied:  02/08/18 1350  Provider Notified: Hyacinth Meeker  Orders Received/Actions taken: na

## 2018-02-09 ENCOUNTER — Inpatient Hospital Stay (HOSPITAL_COMMUNITY): Payer: Self-pay

## 2018-02-09 ENCOUNTER — Other Ambulatory Visit: Payer: Self-pay

## 2018-02-09 ENCOUNTER — Encounter (HOSPITAL_COMMUNITY): Payer: Self-pay | Admitting: Internal Medicine

## 2018-02-09 DIAGNOSIS — J189 Pneumonia, unspecified organism: Secondary | ICD-10-CM

## 2018-02-09 DIAGNOSIS — D649 Anemia, unspecified: Secondary | ICD-10-CM | POA: Diagnosis present

## 2018-02-09 DIAGNOSIS — J984 Other disorders of lung: Secondary | ICD-10-CM

## 2018-02-09 DIAGNOSIS — F17210 Nicotine dependence, cigarettes, uncomplicated: Secondary | ICD-10-CM

## 2018-02-09 DIAGNOSIS — A4101 Sepsis due to Methicillin susceptible Staphylococcus aureus: Principal | ICD-10-CM

## 2018-02-09 DIAGNOSIS — F1990 Other psychoactive substance use, unspecified, uncomplicated: Secondary | ICD-10-CM

## 2018-02-09 DIAGNOSIS — E871 Hypo-osmolality and hyponatremia: Secondary | ICD-10-CM

## 2018-02-09 DIAGNOSIS — R7881 Bacteremia: Secondary | ICD-10-CM

## 2018-02-09 DIAGNOSIS — M609 Myositis, unspecified: Secondary | ICD-10-CM | POA: Diagnosis present

## 2018-02-09 DIAGNOSIS — D696 Thrombocytopenia, unspecified: Secondary | ICD-10-CM | POA: Diagnosis present

## 2018-02-09 LAB — BLOOD CULTURE ID PANEL (REFLEXED)
ACINETOBACTER BAUMANNII: NOT DETECTED
Candida albicans: NOT DETECTED
Candida glabrata: NOT DETECTED
Candida krusei: NOT DETECTED
Candida parapsilosis: NOT DETECTED
Candida tropicalis: NOT DETECTED
ENTEROBACTERIACEAE SPECIES: NOT DETECTED
Enterobacter cloacae complex: NOT DETECTED
Enterococcus species: NOT DETECTED
Escherichia coli: NOT DETECTED
Haemophilus influenzae: NOT DETECTED
Klebsiella oxytoca: NOT DETECTED
Klebsiella pneumoniae: NOT DETECTED
Listeria monocytogenes: NOT DETECTED
Methicillin resistance: NOT DETECTED
NEISSERIA MENINGITIDIS: NOT DETECTED
Proteus species: NOT DETECTED
Pseudomonas aeruginosa: NOT DETECTED
STAPHYLOCOCCUS AUREUS BCID: DETECTED — AB
Serratia marcescens: NOT DETECTED
Staphylococcus species: DETECTED — AB
Streptococcus agalactiae: NOT DETECTED
Streptococcus pneumoniae: NOT DETECTED
Streptococcus pyogenes: NOT DETECTED
Streptococcus species: NOT DETECTED

## 2018-02-09 LAB — BASIC METABOLIC PANEL
Anion gap: 12 (ref 5–15)
BUN: 30 mg/dL — ABNORMAL HIGH (ref 6–20)
CO2: 20 mmol/L — ABNORMAL LOW (ref 22–32)
Calcium: 7 mg/dL — ABNORMAL LOW (ref 8.9–10.3)
Chloride: 99 mmol/L (ref 98–111)
Creatinine, Ser: 1.12 mg/dL — ABNORMAL HIGH (ref 0.44–1.00)
GFR calc Af Amer: >60 mL/min (ref >60)
GFR calc non Af Amer: >60 mL/min (ref >60)
Glucose, Bld: 139 mg/dL — ABNORMAL HIGH (ref 70–99)
Potassium: 3.6 mmol/L (ref 3.5–5.1)
Sodium: 131 mmol/L — ABNORMAL LOW (ref 135–145)

## 2018-02-09 LAB — CBC
HCT: 28.9 % — ABNORMAL LOW (ref 36.0–46.0)
Hemoglobin: 9 g/dL — ABNORMAL LOW (ref 12.0–15.0)
MCH: 25.4 pg — ABNORMAL LOW (ref 26.0–34.0)
MCHC: 31.1 g/dL (ref 30.0–36.0)
MCV: 81.6 fL (ref 80.0–100.0)
Platelets: 136 10*3/uL — ABNORMAL LOW (ref 150–400)
RBC: 3.54 MIL/uL — AB (ref 3.87–5.11)
RDW: 16.6 % — ABNORMAL HIGH (ref 11.5–15.5)
WBC: 26.5 10*3/uL — ABNORMAL HIGH (ref 4.0–10.5)
nRBC: 0.1 % (ref 0.0–0.2)

## 2018-02-09 LAB — MRSA PCR SCREENING: MRSA by PCR: NEGATIVE

## 2018-02-09 LAB — HIV ANTIBODY (ROUTINE TESTING W REFLEX): HIV Screen 4th Generation wRfx: NONREACTIVE

## 2018-02-09 MED ORDER — DEXTROSE 5 % IV SOLN
2.0000 g | Freq: Three times a day (TID) | INTRAVENOUS | Status: DC
  Filled 2018-02-09 (×10): qty 2000

## 2018-02-09 MED ORDER — HYDROMORPHONE HCL 1 MG/ML IJ SOLN
2.0000 mg | INTRAMUSCULAR | Status: DC | PRN
  Administered 2018-02-09 – 2018-02-15 (×44): 2 mg via INTRAVENOUS
  Filled 2018-02-09 (×47): qty 2

## 2018-02-09 MED ORDER — SODIUM CHLORIDE 0.9 % IV BOLUS: 250.0000 mL | Freq: Once | INTRAVENOUS | Status: DC

## 2018-02-09 MED ORDER — SODIUM CHLORIDE 0.9 % IV SOLN
INTRAVENOUS | Status: AC
  Administered 2018-02-10 – 2018-02-11 (×2): via INTRAVENOUS

## 2018-02-09 MED ORDER — CEFAZOLIN SODIUM-DEXTROSE 2-4 GM/100ML-% IV SOLN
2.0000 g | Freq: Three times a day (TID) | INTRAVENOUS | Status: DC
  Administered 2018-02-09 – 2018-03-25 (×131): 2 g via INTRAVENOUS
  Filled 2018-02-09 (×133): qty 100

## 2018-02-09 MED ORDER — INFLUENZA VAC SPLIT QUAD 0.5 ML IM SUSY
0.5000 mL | PREFILLED_SYRINGE | INTRAMUSCULAR | Status: AC
  Administered 2018-02-10: 0.5 mL via INTRAMUSCULAR
  Filled 2018-02-09: qty 0.5

## 2018-02-09 MED ORDER — NICOTINE 14 MG/24HR TD PT24
14.0000 mg | MEDICATED_PATCH | Freq: Every day | TRANSDERMAL | Status: DC
  Filled 2018-02-09: qty 1

## 2018-02-09 MED ORDER — DICLOFENAC EPOLAMINE 1.3 % TD PTCH
1.0000 | MEDICATED_PATCH | Freq: Two times a day (BID) | TRANSDERMAL | Status: DC
  Administered 2018-02-10 – 2018-03-12 (×31): 1 via TRANSDERMAL
  Filled 2018-02-09 (×66): qty 1

## 2018-02-09 MED ORDER — NICOTINE 14 MG/24HR TD PT24
14.0000 mg | MEDICATED_PATCH | Freq: Every day | TRANSDERMAL | Status: DC
  Administered 2018-02-10: 14 mg via TRANSDERMAL
  Filled 2018-02-09 (×8): qty 1

## 2018-02-09 NOTE — Consult Note (Signed)
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  She has a history of penicillin allergy of unknown type.  I will continue vancomycin alone for now and ask pharmacy to review her history to determine if we can safely switch to cefazolin.  I will order repeat blood cultures.  TTE and HIV antibody are pending.  I will order hepatitis C antibody as well.  Plan: 1. Continue vancomycin 2. Ask pharmacy to review her penicillin allergy history 3. Discontinue meropenem 4. Repeat blood cultures 5. Update results of TTE and HIV antibody 6. Hepatitis C antibody  Principal Problem:   MSSA bacteremia Active Problems:   Sepsis (HCC)   Cavitary pneumonia   Myositis   Substance use disorder   IVDU (intravenous drug user)   AKI (acute kidney injury) (HCC)   Hyponatremia   Normocytic anemia   Thrombocytopenia (HCC)   Cigarette smoker   Scheduled Meds: . heparin  5,000 Units Subcutaneous Q8H  . [START ON 02/10/2018] Influenza vac split quadrivalent PF  0.5 mL Intramuscular Tomorrow-1000  . ipratropium-albuterol  3 mL Nebulization BID  . [COMPLETED] pneumococcal 23 valent vaccine  0.5 mL Intramuscular Tomorrow-1000  . sodium chloride flush  3 mL Intravenous Q12H   Continuous Infusions: . meropenem (MERREM) IV Stopped (02/09/18 0134)  . sodium chloride    . vancomycin     PRN Meds:.acetaminophen **OR** acetaminophen, albuterol, HYDROmorphone (DILAUDID) injection, ondansetron **OR** ondansetron (ZOFRAN) IV, senna-docusate  HPI: Amy Mcknight is a 23 y.o. female who was admitted yesterday with acute right lower back pain.  History of polysubstance abuse and addiction with active drug use.  She was  found to have cavitary pneumonia and MRI showed lumbar paraspinous myositis.  Both admission blood cultures are growing MSSA.   Review of Systems: Review of Systems  Unable to perform ROS: Other  Constitutional:       This is a remote consultation so no review of systems was obtained.    Past Medical History:  Diagnosis Date  . Substance use disorder     Social History   Tobacco Use  . Smoking status: Current Every Day Smoker    Packs/day: 0.50    Types: Cigarettes  . Smokeless tobacco: Never Used  Substance Use Topics  . Alcohol use: Not Currently  . Drug use: Yes    Types: Heroin    Family History  Problem Relation Age of Onset  . Diabetes Mother    Allergies  Allergen Reactions  . Penicillins Hives    OBJECTIVE: Blood pressure (!) 91/54, pulse (!) 126, temperature 98.8 F (37.1 C), resp. rate (!) 40, height 5\' 3"  (1.6 m), weight 55.7 kg, SpO2 95 %.  Physical Exam Constitutional:      Comments: This is a remote consultation so no physical exam was performed.     Lab Results Lab Results  Component Value Date   WBC 26.5 (H) 02/09/2018   HGB 9.0 (L) 02/09/2018   HCT 28.9 (L) 02/09/2018   MCV 81.6 02/09/2018   PLT 136 (L) 02/09/2018    Lab Results  Component Value Date  CREATININE 1.12 (H) 02/09/2018   BUN 30 (H) 02/09/2018   NA 131 (L) 02/09/2018   K 3.6 02/09/2018   CL 99 02/09/2018   CO2 20 (L) 02/09/2018    Lab Results  Component Value Date   ALT 25 02/08/2018   AST 33 02/08/2018   ALKPHOS 137 (H) 02/08/2018   BILITOT 0.9 02/08/2018     Microbiology: Recent Results (from the past 240 hour(s))  Blood Culture (routine x 2)     Status: None (Preliminary result)   Collection Time: 02/08/18  1:06 PM  Result Value Ref Range Status   Specimen Description   Final    BLOOD LEFT WRIST Performed at [facility], [address] [place], [place] [address]    Special Requests   Final    BOTTLES DRAWN AEROBIC ONLY Blood Culture results may not be  optimal due to an inadequate volume of blood received in culture bottles Performed at [facility], [address] [place], [place] [address]    Culture  Setup Time   Final    AEROBIC BOTTLE ONLY GRAM POSITIVE COCCI Gram Stain Report Called to,Read Back By and Verified With: J DANIELS,RN @0228  02/09/18 MKELLY CRITICAL RESULT CALLED TO, READ BACK BY AND VERIFIED WITH: PHARMD L POOLE [id number] MLM Performed at [facility], [address] [place], [place] [address]    Culture GRAM POSITIVE COCCI  Final   Report Status PENDING  Incomplete  Blood Culture ID Panel (Reflexed)     Status: Abnormal   Collection Time: 02/08/18  1:06 PM  Result Value Ref Range Status   Enterococcus species NOT DETECTED NOT DETECTED Final   Listeria monocytogenes NOT DETECTED NOT DETECTED Final   Staphylococcus species DETECTED (A) NOT DETECTED Final    Comment: CRITICAL RESULT CALLED TO, READ BACK BY AND VERIFIED WITH: PHARMD L POOLE [id number] MLM    Staphylococcus aureus (BCID) DETECTED (A) NOT DETECTED Final    Comment: Methicillin (oxacillin) susceptible Staphylococcus aureus (MSSA). Preferred therapy is anti staphylococcal beta lactam antibiotic (Cefazolin or Nafcillin), unless clinically contraindicated. CRITICAL RESULT CALLED TO, READ BACK BY AND VERIFIED WITH: PHARMD L POOLE [phone] MLM    Methicillin resistance NOT DETECTED NOT DETECTED Final   Streptococcus species NOT DETECTED NOT DETECTED Final   Streptococcus agalactiae NOT DETECTED NOT DETECTED Final   Streptococcus pneumoniae NOT DETECTED NOT DETECTED Final   Streptococcus pyogenes NOT DETECTED NOT DETECTED Final   Acinetobacter baumannii NOT DETECTED NOT DETECTED Final   Enterobacteriaceae species NOT DETECTED NOT DETECTED Final   Enterobacter cloacae complex NOT DETECTED NOT DETECTED Final   Escherichia coli NOT DETECTED NOT DETECTED Final   Klebsiella oxytoca NOT DETECTED NOT DETECTED Final   Klebsiella pneumoniae NOT DETECTED NOT  DETECTED Final   Proteus species NOT DETECTED NOT DETECTED Final   Serratia marcescens NOT DETECTED NOT DETECTED Final   Haemophilus influenzae NOT DETECTED NOT DETECTED Final   Neisseria meningitidis NOT DETECTED NOT DETECTED Final   Pseudomonas aeruginosa NOT DETECTED NOT DETECTED Final   Candida albicans NOT DETECTED NOT DETECTED Final   Candida glabrata NOT DETECTED NOT DETECTED Final   Candida krusei NOT DETECTED NOT DETECTED Final   Candida parapsilosis NOT DETECTED NOT DETECTED Final   Candida tropicalis NOT DETECTED NOT DETECTED Final    Comment: Performed at [facility], [address] [place], [place] [address]  Blood Culture (routine x 2)     Status: None (Preliminary result)   Collection Time:  02/08/18  1:13 PM  Result Value Ref Range Status   Specimen Description BLOOD LEFT ARM  Final   Special Requests   Final    BOTTLES DRAWN AEROBIC AND ANAEROBIC Blood Culture adequate volume   Culture  Setup Time   Final    IN BOTH AEROBIC AND ANAEROBIC BOTTLES GRAM POSITIVE COCCI Gram Stain Report Called to,Read Back By and Verified With: J DANIELS,RN @0230  02/09/18 MKELLY    Culture   Final    NO GROWTH < 24 HOURS Performed at [facility], [address] [place], [place] [address]    Report Status PENDING  Incomplete  MRSA PCR Screening     Status: None   Collection Time: 02/08/18  8:51 PM  Result Value Ref Range Status   MRSA by PCR NEGATIVE NEGATIVE Final    Comment:        The GeneXpert MRSA Assay (FDA approved for NASAL specimens only), is one component of a comprehensive MRSA colonization surveillance program. It is not intended to diagnose MRSA infection nor to guide or monitor treatment for MRSA infections. Performed at [facility], [address], [place], [place] [address]     Cliffton Asters, MD [facility] [phone] pager   [phone] cell 02/09/2018, 8:47 AM

## 2018-02-09 NOTE — Progress Notes (Signed)
PHARMACY - PHYSICIAN COMMUNICATION CRITICAL VALUE ALERT - BLOOD CULTURE IDENTIFICATION (BCID)  [name] is an 23 y.o. female who presented to [facility] on 02/08/2018 with a chief complaint of low back pain  Assessment:  23 y.o. female with medical history significant for substance use disorder who presents to the ED with 2 days of right lower back pain. She has noted associated diaphoresis, chills, shortness of breath, cough productive of brown sputum, and right chest wall pain. She has an elevated WBC, afebrile.   Name of physician (or Provider) Contacted: Dr. Gwenlyn PerkingMadera and Dr. Orvan Falconerampbell  Current antibiotics: vancomycin   Changes to prescribed antibiotics recommended: Investigated PCN allergy, pt said she didn't remember exactly what happened when she got PCN so contacted Mother and she said [place] had a diaper rash after PCN.  Recommendations accepted by provider will change to cefazolin 2gm IV q8h  Results for orders placed or performed during the hospital encounter of 02/08/18  Blood Culture ID Panel (Reflexed) (Collected: 02/08/2018  1:06 PM)  Result Value Ref Range   Enterococcus species NOT DETECTED NOT DETECTED   Listeria monocytogenes NOT DETECTED NOT DETECTED   Staphylococcus species DETECTED (A) NOT DETECTED   Staphylococcus aureus (BCID) DETECTED (A) NOT DETECTED   Methicillin resistance NOT DETECTED NOT DETECTED   Streptococcus species NOT DETECTED NOT DETECTED   Streptococcus agalactiae NOT DETECTED NOT DETECTED   Streptococcus pneumoniae NOT DETECTED NOT DETECTED   Streptococcus pyogenes NOT DETECTED NOT DETECTED   Acinetobacter baumannii NOT DETECTED NOT DETECTED   Enterobacteriaceae species NOT DETECTED NOT DETECTED   Enterobacter cloacae complex NOT DETECTED NOT DETECTED   Escherichia coli NOT DETECTED NOT DETECTED   Klebsiella oxytoca NOT DETECTED NOT DETECTED   Klebsiella pneumoniae NOT DETECTED NOT DETECTED   Proteus species NOT DETECTED NOT DETECTED   Serratia  marcescens NOT DETECTED NOT DETECTED   Haemophilus influenzae NOT DETECTED NOT DETECTED   Neisseria meningitidis NOT DETECTED NOT DETECTED   Pseudomonas aeruginosa NOT DETECTED NOT DETECTED   Candida albicans NOT DETECTED NOT DETECTED   Candida glabrata NOT DETECTED NOT DETECTED   Candida krusei NOT DETECTED NOT DETECTED   Candida parapsilosis NOT DETECTED NOT DETECTED   Candida tropicalis NOT DETECTED NOT DETECTED   Elder CyphersLorie Irisha Grandmaison, BS Loura Backharm D, BCPS Clinical Pharmacist Pager [phone] 02/09/2018  12:04 PM

## 2018-02-09 NOTE — Progress Notes (Signed)
Patient is currently on room air with sats of 100%. Patient is in no distress and is resting comfortably. BIPAP is not needed at this time. Will continue to monitor.

## 2018-02-09 NOTE — Clinical Social Work Note (Signed)
Clinical Social Work Assessment  Patient Details  Name: Amy Mcknight MRN: [id number] Date of Birth: [birth year]  Date of referral:  02/09/18               Reason for consult:  Substance Use/ETOH Abuse                Permission sought to share information with:    Permission granted to share information::     Name::        Agency::     Relationship::     Contact Information:     Housing/Transportation Living arrangements for the past 2 months:  Single Family Home Source of Information:  Patient Patient Interpreter Needed:  None Criminal Activity/Legal Involvement Pertinent to Current Situation/Hospitalization:  No - Comment as needed Significant Relationships:  Parents, Siblings Lives with:  Parents Do you feel safe going back to the place where you live?  Yes Need for family participation in patient care:  No (Coment)  Care giving concerns:  Pt states she came in from home for lower back pain, which has gotten steadily worse   Office manager / plan:  23 YO AA female diagnosed with MSSA bactermia.  UDS positive for opiates, benzos, cocaine and cannabis.  Pt admits to heroin use upon admission. Interview with patient was brief as patient was c/o pain the entire time we were together.  She states she lives with her mother in [place], and has a couple of siblings who live on their own.  As for the drugs, she states "I am never using drugs again."  When asked if she thinks using drugs are related to her admission here, she responds that she is not sure how they are related, but they are.  Unwilling to answer any further questions, both drug related and non-drug related.  She believes her mother will be here after 3PM.    Employment status:   Unclear Insurance information:  Self Pay (Medicaid Pending) PT Recommendations:  Not assessed at this time Information / Referral to community resources:     Patient/Family's Response to care:  Pain level made it impossible to  gauge.  Patient/Family's Understanding of and Emotional Response to Diagnosis, Current Treatment, and Prognosis:  Pain level made it impossible to gauge.    Emotional Assessment Appearance:  Appears stated age Attitude/Demeanor/Rapport:  Guarded, Unable to Assess Affect (typically observed):  Withdrawn Orientation:  Oriented to Self, Oriented to  Time, Oriented to Place, Oriented to Situation Alcohol / Substance use:  Illicit Drugs Psych involvement (Current and /or in the community):  No (Comment)  Discharge Needs  Concerns to be addressed:  Substance Abuse Concerns Readmission within the last 30 days:  No Current discharge risk:  Substance Abuse Barriers to Discharge:  Active Substance Use   Recommendation:  CSW follow up with patientfor further conversation when pain has subsided .   Ida Rogue, LCSW  02/09/2018, 12:04 PM

## 2018-02-09 NOTE — Progress Notes (Signed)
PROGRESS NOTE    Amy Mcknight  PYY:[id number] DOB: [birth year] DOA: 02/08/2018 PCP: Patient, No Pcp Per     Brief Narrative:   23 y.o. female with medical history significant for substance use disorder who presents to the ED with 2 days of right lower back pain.  Patient states symptoms began with acute onset while at rest.  Symptoms have been persistent.  She has noted associated diaphoresis, chills, shortness of breath, cough productive of brown sputum, and right chest wall pain.  She denies any rashes or obvious skin changes.  She reports good urine output without dysuria.  She denies any abdominal pain, diarrhea, or constipation.  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  She continues to use recreational drugs by snorting nasally.  She denies any alcohol use.  She reports a history of withdrawal from opiates in the past.  Assessment & Plan: 1-sepsis with early septic shock in the setting of MSSA bacteremia and cavitary pneumonia. -Pneumonia lesions could potentially also represent septic emboli -Very high concerns for endocarditis -Case discussed with infectious disease doctors who has recommended continue supportive care, IV fluids and transition antibiotics to the use of Ancef. -At this moment plan is for 6 weeks of supervised IV treatment. -Repeat blood cultures.  2-AKI (acute kidney injury) (HCC) -In the setting of sepsis/prerenal azotemia -Will minimize the use of nephrotoxic agents -Continue IV fluids -Follow creatinine trend.  3-Substance use disorder -Cessation counseling has been provided.  4-tobacco abuse -I have discussed tobacco cessation with the patient.  I have counseled the patient regarding the negative impacts of continued tobacco use including but not limited to lung cancer, COPD, and cardiovascular disease.  I have discussed alternatives to tobacco and modalities that may help facilitate tobacco cessation including but not limited to biofeedback,  hypnosis, and medications.  Total time spent with tobacco counseling was 4 minutes -nicotine patch offered.   5-Hyponatremia -in the setting of dehydration -continue IVF's -Na 131  DVT prophylaxis: Heparin Code Status: Full code Family Communication: No family at bedside. Disposition Plan: Remains inpatient, continue IV fluids, transition antibiotics to IV Ancef; reassess blood culture.  Continue as needed analgesics and supportive care.  Consultants:   Infectious disease over the phone (Dr. Orvan Falconer).  Procedures:   2D echo: Pending  Antimicrobials:  Anti-infectives (From admission, onward)   Start     Dose/Rate Route Frequency Ordered Stop   02/09/18 1500  vancomycin (VANCOCIN) IVPB 750 mg/150 ml premix  Status:  Discontinued     750 mg 150 mL/hr over 60 Minutes Intravenous Every 24 hours 02/08/18 1553 02/09/18 1201   02/09/18 1400  ceFAZolin (ANCEF) IVPB 2g/100 mL premix     2 g 200 mL/hr over 30 Minutes Intravenous Every 8 hours 02/09/18 1220     02/09/18 1300  ceFAZolin (ANCEF) 2 g in dextrose 5 % 100 mL IVPB  Status:  Discontinued     2 g 200 mL/hr over 30 Minutes Intravenous Every 8 hours 02/09/18 1201 02/09/18 1220   02/09/18 0000  meropenem (MERREM) 1 g in sodium chloride 0.9 % 100 mL IVPB  Status:  Discontinued     1 g 200 mL/hr over 30 Minutes Intravenous Every 12 hours 02/08/18 1459 02/09/18 0853   02/08/18 1445  vancomycin (VANCOCIN) IVPB 1000 mg/200 mL premix     1,000 mg 200 mL/hr over 60 Minutes Intravenous  Once 02/08/18 1442 02/08/18 1555   02/08/18 1415  meropenem (MERREM) 1 g in sodium chloride  0.9 % 100 mL IVPB     1 g 200 mL/hr over 30 Minutes Intravenous  Once 02/08/18 1402 02/08/18 1455       Subjective: Warm to touch, currently afebrile; reports no feeling good and having excruciating pain in her lower back.  Objective: Vitals:   02/09/18 1200 02/09/18 1300 02/09/18 1400 02/09/18 1600  BP: (!) 88/53 (!) 87/55 (!) 89/59 101/63  Pulse:        Resp: (!) 60 (!) 46 (!) 49 (!) 35  Temp:      TempSrc:      SpO2:      Weight:      Height:        Intake/Output Summary (Last 24 hours) at 02/09/2018 1625 Last data filed at 02/09/2018 0639 Gross per 24 hour  Intake 2600 ml  Output 500 ml  Net 2100 ml   Filed Weights   02/08/18 1145 02/08/18 2107 02/09/18 0600  Weight: 54.4 kg 55.2 kg 55.7 kg    Examination: General exam: Alert, awake, oriented x 3; reporting excruciating pain in her lower back.  No nausea, no vomiting.  Still warm to touch. Respiratory system: Positive rhonchi right, no wheezing, no using accessory muscles.   Cardiovascular system: Tachycardic, no rubs, no gallops, no JVD.   Gastrointestinal system: Abdomen is nondistended, soft and nontender. No organomegaly or masses felt. Normal bowel sounds heard. Central nervous system: Alert and oriented. No focal neurological deficits. Extremities: No C/C/E, +pedal pulses Skin: No rashes, lesions or ulcers Psychiatry: Judgement and insight appear normal. Mood & affect appropriate.    Data Reviewed: I have personally reviewed following labs and imaging studies  CBC: Recent Labs  Lab 02/08/18 1306 02/09/18 0640  WBC 26.6* 26.5*  NEUTROABS 23.2*  --   HGB 10.2* 9.0*  HCT 32.8* 28.9*  MCV 80.2 81.6  PLT 145* 136*   Basic Metabolic Panel: Recent Labs  Lab 02/08/18 1306 02/09/18 0640  NA 127* 131*  K 3.7 3.6  CL 88* 99  CO2 24 20*  GLUCOSE 117* 139*  BUN 41* 30*  CREATININE 2.41* 1.12*  CALCIUM 7.9* 7.0*   GFR: Estimated Creatinine Clearance: 64.6 mL/min (A) (by C-G formula based on SCr of 1.12 mg/dL (H)).   Liver Function Tests: Recent Labs  Lab 02/08/18 1306  AST 33  ALT 25  ALKPHOS 137*  BILITOT 0.9  PROT 7.5  ALBUMIN 2.3*   Urine analysis:    Component Value Date/Time   COLORURINE AMBER (A) 02/08/2018 1319   APPEARANCEUR CLOUDY (A) 02/08/2018 1319   LABSPEC 1.021 02/08/2018 1319   PHURINE 5.0 02/08/2018 1319   GLUCOSEU NEGATIVE  02/08/2018 1319   HGBUR NEGATIVE 02/08/2018 1319   BILIRUBINUR NEGATIVE 02/08/2018 1319   KETONESUR NEGATIVE 02/08/2018 1319   PROTEINUR NEGATIVE 02/08/2018 1319   NITRITE NEGATIVE 02/08/2018 1319   LEUKOCYTESUR MODERATE (A) 02/08/2018 1319    Recent Results (from the past 240 hour(s))  Blood Culture (routine x 2)     Status: None (Preliminary result)   Collection Time: 02/08/18  1:06 PM  Result Value Ref Range Status   Specimen Description   Final    BLOOD LEFT WRIST Performed at [facility], [address] [place], [place] [address]    Special Requests   Final    BOTTLES DRAWN AEROBIC ONLY Blood Culture results may not be optimal due to an inadequate volume of blood received in culture bottles Performed at [facility], [address] [place], [place] [address]  Culture  Setup Time   Final    AEROBIC BOTTLE ONLY GRAM POSITIVE COCCI Gram Stain Report Called to,Read Back By and Verified With: J DANIELS,RN @0228  02/09/18 MKELLY CRITICAL RESULT CALLED TO, READ BACK BY AND VERIFIED WITH: PHARMD L POOLE [id number] MLM Performed at [facility], [address], [place], [place] [address]    Culture GRAM POSITIVE COCCI  Final   Report Status PENDING  Incomplete  Blood Culture ID Panel (Reflexed)     Status: Abnormal   Collection Time: 02/08/18  1:06 PM  Result Value Ref Range Status   Enterococcus species NOT DETECTED NOT DETECTED Final   Listeria monocytogenes NOT DETECTED NOT DETECTED Final   Staphylococcus species DETECTED (A) NOT DETECTED Final    Comment: CRITICAL RESULT CALLED TO, READ BACK BY AND VERIFIED WITH: PHARMD L POOLE [id number] MLM    Staphylococcus aureus (BCID) DETECTED (A) NOT DETECTED Final    Comment: Methicillin (oxacillin) susceptible Staphylococcus aureus (MSSA). Preferred therapy is anti staphylococcal beta lactam antibiotic (Cefazolin or Nafcillin), unless clinically contraindicated. CRITICAL RESULT CALLED TO, READ BACK BY AND VERIFIED  WITH: PHARMD L POOLE [id number] MLM    Methicillin resistance NOT DETECTED NOT DETECTED Final   Streptococcus species NOT DETECTED NOT DETECTED Final   Streptococcus agalactiae NOT DETECTED NOT DETECTED Final   Streptococcus pneumoniae NOT DETECTED NOT DETECTED Final   Streptococcus pyogenes NOT DETECTED NOT DETECTED Final   Acinetobacter baumannii NOT DETECTED NOT DETECTED Final   Enterobacteriaceae species NOT DETECTED NOT DETECTED Final   Enterobacter cloacae complex NOT DETECTED NOT DETECTED Final   Escherichia coli NOT DETECTED NOT DETECTED Final   Klebsiella oxytoca NOT DETECTED NOT DETECTED Final   Klebsiella pneumoniae NOT DETECTED NOT DETECTED Final   Proteus species NOT DETECTED NOT DETECTED Final   Serratia marcescens NOT DETECTED NOT DETECTED Final   Haemophilus influenzae NOT DETECTED NOT DETECTED Final   Neisseria meningitidis NOT DETECTED NOT DETECTED Final   Pseudomonas aeruginosa NOT DETECTED NOT DETECTED Final   Candida albicans NOT DETECTED NOT DETECTED Final   Candida glabrata NOT DETECTED NOT DETECTED Final   Candida krusei NOT DETECTED NOT DETECTED Final   Candida parapsilosis NOT DETECTED NOT DETECTED Final   Candida tropicalis NOT DETECTED NOT DETECTED Final    Comment: Performed at [facility], [address], [place], [place] [address]  Blood Culture (routine x 2)     Status: None (Preliminary result)   Collection Time: 02/08/18  1:13 PM  Result Value Ref Range Status   Specimen Description BLOOD LEFT ARM  Final   Special Requests   Final    BOTTLES DRAWN AEROBIC AND ANAEROBIC Blood Culture adequate volume   Culture  Setup Time   Final    IN BOTH AEROBIC AND ANAEROBIC BOTTLES GRAM POSITIVE COCCI Gram Stain Report Called to,Read Back By and Verified With: J DANIELS,RN @0230  02/09/18 MKELLY    Culture   Final    NO GROWTH < 24 HOURS Performed at [facility], [address], [place], [place] [address]    Report Status PENDING  Incomplete  Urine  culture     Status: Abnormal (Preliminary result)   Collection Time: 02/08/18  1:19 PM  Result Value Ref Range Status   Specimen Description   Final    URINE, CLEAN CATCH Performed at [facility], [address] [place], [place] [address]    Special Requests   Final    NONE Performed at [facility], [address], [place], [place] [address]    Culture (A)  Final    >=100,000 COLONIES/mL STAPHYLOCOCCUS AUREUS SUSCEPTIBILITIES TO FOLLOW Performed at [facility], [address] [place], [place] [address]    Report Status PENDING  Incomplete  MRSA PCR Screening     Status: None   Collection Time: 02/08/18  8:51 PM  Result Value Ref Range Status   MRSA by PCR NEGATIVE NEGATIVE Final    Comment:        The GeneXpert MRSA Assay (FDA approved for NASAL specimens only), is one component of a comprehensive MRSA colonization surveillance program. It is not intended to diagnose MRSA infection nor to guide or monitor treatment for MRSA infections. Performed at [facility], [address], [place], [place] [address]      Radiology Studies: Ct Abdomen Pelvis Wo Contrast  Result Date: 02/08/2018 CLINICAL DATA:  Abdominal pain and fever EXAM: CT ABDOMEN AND PELVIS WITHOUT CONTRAST TECHNIQUE: Multidetector CT imaging of the abdomen and pelvis was performed following the standard protocol without oral or IV contrast. COMPARISON:  None. FINDINGS: Lower chest: There is airspace consolidation throughout the lung bases with several areas of cavitation, likely developing lung abscesses. Several nodular opacities are noted associated with these areas of cavitary consolidation, likely septic emboli. Hepatobiliary: Liver measures 20.5 cm in length. No focal liver lesions are appreciable on this noncontrast enhanced study. The gallbladder is borderline dilated without wall thickening. There is no biliary duct dilatation. Pancreas: No pancreatic mass or inflammatory focus. Spleen: Spleen measures  14.1 x 10.9 x 5.1 cm with a measured splenic volume of 392 cubic cm. No focal splenic lesions are evident. Adrenals/Urinary Tract: Adrenals appear unremarkable bilaterally. 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 No renal mass evident. No hydronephrosis on either side. There is a suspected developing staghorn calculus on the left with somewhat amorphous calcification involving a lower pole calyx on the left extending into the left renal pelvis, best appreciated on coronal imaging. This developing staghorn calculus measures 3.7 x 1.0 cm. No ureteral calculi are evident. Urinary bladder is decompressed. No urinary bladder wall thickening is appreciable with essentially empty bladder. Stomach/Bowel: There is fluid throughout most small bowel loops. There is no appreciable bowel wall or mesenteric thickening. No evident bowel obstruction. No free air or portal venous air is evident. Vascular/Lymphatic: There is no abdominal aortic aneurysm. No vascular lesions are evident on this noncontrast enhanced study. Note that the attenuation throughout the vascular structures appear slightly diminished which raises question of anemia. No adenopathy is appreciable in abdomen or pelvis. Reproductive: Uterus is anteverted. No pelvic masses evident. There is a slight degree of free fluid in the cul-de-sac region. Other: Appendix is not well seen. There is no periappendiceal region inflammation on this study. No abscess is seen in the abdomen or pelvis. There is no ascites beyond the slight fluid noted in the cul-de-sac region. Musculoskeletal: There are no blastic or lytic bone lesions. No intramuscular or abdominal wall lesions are appreciable. IMPRESSION: Comment: Note that paucity of fat makes assessment in the abdomen and pelvis somewhat less than optimal. 1. Areas of cavitary pneumonia, likely with developing lung abscesses, in the right lower lobe. Areas of consolidation elsewhere with probable septic emboli associated. 2.  Prominent liver and spleen. No focal liver and splenic lesions evident. 3. There is evidence of a degree of nephrocalcinosis. Amorphous calcification in a portion of the left renal pelvis and an inferior left calyx suggest developing staghorn calculus. No  hydronephrosis on either side. No well-defined ureteral calculi. 4. Fluid in most loops of small bowel. Suspect a degree of ileus or enteritis. No bowel obstruction. No abscess evident in the abdomen or pelvis. 5. Small amount of free fluid in the cul-de-sac may be upper physiologic. Electronically Signed   By: Bretta BangWilliam  Woodruff III M.D.   On: 02/08/2018 14:47   Dg Chest 1 View  Result Date: 02/08/2018 CLINICAL DATA:  Hypotension and tachycardia. Smoking history. Asthma. EXAM: CHEST  1 VIEW COMPARISON:  None. FINDINGS: Heart size is within normal limits. Mild prominence of the LEFT hilum. Patchy opacities bilaterally, most prominent at the RIGHT lung base, majority of which have central lucencies suggest cavitary lesions. IMPRESSION: 1. Patchy opacities bilaterally, majority of which have central lucencies suggest cavitary masses/infections. Corresponding cavitary consolidations noted at the lung bases of a CT abdomen performed at the time of today's chest x-ray. Recommend CT chest with contrast for complete evaluation. 2. Questionable prominence of the LEFT hilum, additional cavitary mass/consolidation versus central bronchiectasis. Electronically Signed   By: Bary RichardStan  Maynard M.D.   On: 02/08/2018 14:48   Mr Lumbar Spine W Wo Contrast  Result Date: 02/08/2018 CLINICAL DATA:  Acute onset of back pain beginning 2 days ago. Study was ordered with contrast but the patient refused. EXAM: MRI LUMBAR SPINE WITHOUT CONTRAST TECHNIQUE: Multiplanar, multisequence MR imaging of the lumbar spine was performed. No intravenous contrast was administered. COMPARISON:  None. FINDINGS: Segmentation:  5 lumbar type vertebral bodies. Alignment:  Normal Vertebrae:  Normal Conus  medullaris and cauda equina: Conus extends to the L1 level. Conus and cauda equina appear normal. Paraspinal and other soft tissues: There is lower paraspinous muscle edema as might be seen with a muscular strain. This appears symmetric from right to left. Disc levels: No abnormality at L3-4 or above. L4-5: Very minimal disc bulge.  No stenosis or neural compression. L5-S1: Very minimal disc bulge.  No stenosis or neural compression. IMPRESSION: Minimal disc bulges at L4-5 and L5-S1. No stenosis or neural compression. Paraspinous muscle edema on both sides in the lower lumbar region as might be seen with a muscular strain. This is nonspecific. The differential diagnosis does include infection, but the symmetric nature would be unusual. The patient refused contrast administration. Electronically Signed   By: Paulina FusiMark  Shogry M.D.   On: 02/08/2018 13:56    Scheduled Meds: . diclofenac  1 patch Transdermal BID  . heparin  5,000 Units Subcutaneous Q8H  . [START ON 02/10/2018] Influenza vac split quadrivalent PF  0.5 mL Intramuscular Tomorrow-1000  . ipratropium-albuterol  3 mL Nebulization BID  . sodium chloride flush  3 mL Intravenous Q12H   Continuous Infusions: . sodium chloride    .  ceFAZolin (ANCEF) IV    . sodium chloride       LOS: 1 day    Time spent: 35 minutes. Greater than 50% of this time was spent in direct contact with the patient, coordinating care and discussing relevant ongoing clinical issues, including discussion about ongoing bacteremia, anticipated treatment and length of therapy; most likely causes that has triggered this infection and cessation counseling for polysubstance abuse.  Case has been discussed over the phone with Dr. Orvan Falconerampbell (infectious disease); continue the use of IV antibiotics with anticipated management for 6 weeks of supervised treatment.  Will repeat blood cultures, check 2D echo and follow results for HIV and Hep C.   Vassie Lollarlos Mollie Rossano, MD [facility] Pager [phone]  02/09/2018, 4:25 PM

## 2018-02-10 ENCOUNTER — Inpatient Hospital Stay (HOSPITAL_COMMUNITY): Payer: Self-pay

## 2018-02-10 DIAGNOSIS — I361 Nonrheumatic tricuspid (valve) insufficiency: Secondary | ICD-10-CM

## 2018-02-10 DIAGNOSIS — R768 Other specified abnormal immunological findings in serum: Secondary | ICD-10-CM | POA: Diagnosis present

## 2018-02-10 LAB — HEPATITIS C ANTIBODY (REFLEX): HCV Ab: >11 s/co ratio — ABNORMAL HIGH (ref 0.0–0.9)

## 2018-02-10 LAB — URINE CULTURE: Culture: >=100000 — AB

## 2018-02-10 LAB — COMMENT2 - HEP PANEL

## 2018-02-10 MED ORDER — METOPROLOL TARTRATE 5 MG/5ML IV SOLN
2.5000 mg | Freq: Three times a day (TID) | INTRAVENOUS | Status: DC
  Administered 2018-02-10 – 2018-02-16 (×18): 2.5 mg via INTRAVENOUS
  Filled 2018-02-10 (×18): qty 5

## 2018-02-10 MED ORDER — ALBUTEROL SULFATE (2.5 MG/3ML) 0.083% IN NEBU
2.5000 mg | INHALATION_SOLUTION | Freq: Four times a day (QID) | RESPIRATORY_TRACT | Status: DC | PRN
  Administered 2018-02-11 – 2018-02-23 (×16): 2.5 mg via RESPIRATORY_TRACT
  Filled 2018-02-10 (×17): qty 3

## 2018-02-10 MED ORDER — OXYCODONE HCL 5 MG PO TABS
5.0000 mg | ORAL_TABLET | Freq: Four times a day (QID) | ORAL | Status: DC | PRN
  Administered 2018-02-10 – 2018-02-11 (×4): 5 mg via ORAL
  Filled 2018-02-10 (×4): qty 1

## 2018-02-10 NOTE — Progress Notes (Signed)
Patient refused blood work this AM. Dr. Gwenlyn Perking made aware.

## 2018-02-10 NOTE — Progress Notes (Signed)
PROGRESS NOTE    Amy Mcknight  ZOX:[id number] DOB: 1995/08/13 DOA: 02/08/2018 PCP: Patient, No Pcp Per     Brief Narrative:   23 y.o. female with medical history significant for substance use disorder who presents to the ED with 2 days of right lower back pain.  Patient states symptoms began with acute onset while at rest.  Symptoms have been persistent.  She has noted associated diaphoresis, chills, shortness of breath, cough productive of brown sputum, and right chest wall pain.  She denies any rashes or obvious skin changes.  She reports good urine output without dysuria.  She denies any abdominal pain, diarrhea, or constipation.  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  She continues to use recreational drugs by snorting nasally.  She denies any alcohol use.  She reports a history of withdrawal from opiates in the past.  Assessment & Plan: 1-sepsis with early septic shock in the setting of MSSA bacteremia, tricuspid vegetation and cavitary pneumonia. -Pneumonia lesions could potentially also represent septic emboli -Case discussed with infectious disease doctors who has recommended continue supportive care, IV fluids and transition antibiotics to the use of Ancef. -At this moment plan is for 6 weeks of supervised IV treatment. -follow Repeat blood cultures. -2D echo positive for tricuspid vegetation and moderate regurgitation.  2-AKI (acute kidney injury) (HCC) -In the setting of sepsis/prerenal azotemia -Will minimize the use of nephrotoxic agents -Continue IV fluids -Follow creatinine trend. -Patient refused blood work this morning. -Education provided about importance to let staff have blood work and cooperate with care.  3-Substance use disorder -Extensive cessation counseling has been provided.    4-tobacco abuse -Extensive cessation counseling provided. -nicotine patch offered.   5-Hyponatremia -in the setting of dehydration -continue IVF's, but adjust  rate as patient is eating and drinking more.  6-positive hepatitis C antibody -Quantitative reflex test pending at this time to determine need of treatment -We will follow recommendations by ID service.  DVT prophylaxis: Heparin Code Status: Full code Family Communication: No family at bedside. Disposition Plan: Remains inpatient, continue IV fluids, continue IV Ancef.  Follow repeated blood cultures.  Continue supportive care and as needed analgesics.    Consultants:   Infectious disease over the phone (Dr. Orvan Falconer).  Procedures:   2D echo:  Fraction (55-60%), and no microscopic valve with moderate regurgitation and large vegetation.  No wall motion normalities appreciated.  Antimicrobials:  Anti-infectives (From admission, onward)   Start     Dose/Rate Route Frequency Ordered Stop   02/09/18 1500  vancomycin (VANCOCIN) IVPB 750 mg/150 ml premix  Status:  Discontinued     750 mg 150 mL/hr over 60 Minutes Intravenous Every 24 hours 02/08/18 1553 02/09/18 1201   02/09/18 1400  ceFAZolin (ANCEF) IVPB 2g/100 mL premix     2 g 200 mL/hr over 30 Minutes Intravenous Every 8 hours 02/09/18 1220     02/09/18 1300  ceFAZolin (ANCEF) 2 g in dextrose 5 % 100 mL IVPB  Status:  Discontinued     2 g 200 mL/hr over 30 Minutes Intravenous Every 8 hours 02/09/18 1201 02/09/18 1220   02/09/18 0000  meropenem (MERREM) 1 g in sodium chloride 0.9 % 100 mL IVPB  Status:  Discontinued     1 g 200 mL/hr over 30 Minutes Intravenous Every 12 hours 02/08/18 1459 02/09/18 0853   02/08/18 1445  vancomycin (VANCOCIN) IVPB 1000 mg/200 mL premix     1,000 mg 200 mL/hr over 60 Minutes  Intravenous  Once 02/08/18 1442 02/08/18 1555   02/08/18 1415  meropenem (MERREM) 1 g in sodium chloride 0.9 % 100 mL IVPB     1 g 200 mL/hr over 30 Minutes Intravenous  Once 02/08/18 1402 02/08/18 1455       Subjective: Tachypneic, complaining of diffuse/generalized pain and currently is refusing blood work or other  clinical assessments by nursing staff.  Objective: Vitals:   02/10/18 1500 02/10/18 1600 02/10/18 1628 02/10/18 1800  BP: (!) 104/59 101/60  102/67  Pulse: (!) 145 (!) 143  (!) 140  Resp: (!) 47 (!) 46  (!) 49  Temp:   100 F (37.8 C)   TempSrc:   Oral   SpO2: 97% 94%  97%  Weight:      Height:        Intake/Output Summary (Last 24 hours) at 02/10/2018 1855 Last data filed at 02/10/2018 1506 Gross per 24 hour  Intake 2370.41 ml  Output 1500 ml  Net 870.41 ml   Filed Weights   02/08/18 2107 02/09/18 0600 02/10/18 0500  Weight: 55.2 kg 55.7 kg 59.6 kg    Examination: General exam: Alert, awake, oriented x 3, continue to reports generalized pain and not feeling good.  Patient denies nausea vomiting. Respiratory system: Positive rhonchi, no wheezing, no crackles.  Positive tachypnea.   Cardiovascular system: Tachycardic, no rubs, no gallops, no JVD on exam.  Gastrointestinal system: Abdomen is nondistended, soft and nontender. No organomegaly or masses felt. Normal bowel sounds heard. Central nervous system: Alert and oriented. No focal neurological deficits. Extremities: No C/C/E, +pedal pulses Skin: No rashes, lesions or ulcers Psychiatry: Judgement and insight appear normal. Mood & affect appropriate.    Data Reviewed: I have personally reviewed following labs and imaging studies  CBC: Recent Labs  Lab 02/08/18 1306 02/09/18 0640  WBC 26.6* 26.5*  NEUTROABS 23.2*  --   HGB 10.2* 9.0*  HCT 32.8* 28.9*  MCV 80.2 81.6  PLT 145* 136*   Basic Metabolic Panel: Recent Labs  Lab 02/08/18 1306 02/09/18 0640  NA 127* 131*  K 3.7 3.6  CL 88* 99  CO2 24 20*  GLUCOSE 117* 139*  BUN 41* 30*  CREATININE 2.41* 1.12*  CALCIUM 7.9* 7.0*   GFR: Estimated Creatinine Clearance: 64.6 mL/min (A) (by C-G formula based on SCr of 1.12 mg/dL (H)).   Liver Function Tests: Recent Labs  Lab 02/08/18 1306  AST 33  ALT 25  ALKPHOS 137*  BILITOT 0.9  PROT 7.5  ALBUMIN 2.3*    Urine analysis:    Component Value Date/Time   COLORURINE AMBER (A) 02/08/2018 1319   APPEARANCEUR CLOUDY (A) 02/08/2018 1319   LABSPEC 1.021 02/08/2018 1319   PHURINE 5.0 02/08/2018 1319   GLUCOSEU NEGATIVE 02/08/2018 1319   HGBUR NEGATIVE 02/08/2018 1319   BILIRUBINUR NEGATIVE 02/08/2018 1319   KETONESUR NEGATIVE 02/08/2018 1319   PROTEINUR NEGATIVE 02/08/2018 1319   NITRITE NEGATIVE 02/08/2018 1319   LEUKOCYTESUR MODERATE (A) 02/08/2018 1319    Recent Results (from the past 240 hour(s))  Blood Culture (routine x 2)     Status: Abnormal (Preliminary result)   Collection Time: 02/08/18  1:06 PM  Result Value Ref Range Status   Specimen Description   Final    BLOOD LEFT WRIST Performed at [facility], [address] [place], [place] [address]    Special Requests   Final    BOTTLES DRAWN AEROBIC ONLY Blood Culture results may not be optimal due to an  inadequate volume of blood received in culture bottles Performed at [facility], [address] [place], [place] [address]    Culture  Setup Time   Final    AEROBIC BOTTLE ONLY GRAM POSITIVE COCCI Gram Stain Report Called to,Read Back By and Verified With: J DANIELS,RN @0228  02/09/18 MKELLY CRITICAL RESULT CALLED TO, READ BACK BY AND VERIFIED WITH: PHARMD L POOLE [phone] MLM Performed at [facility], [address] [place], [place] [address]    Culture STAPHYLOCOCCUS AUREUS (A)  Final   Report Status PENDING  Incomplete  Blood Culture ID Panel (Reflexed)     Status: Abnormal   Collection Time: 02/08/18  1:06 PM  Result Value Ref Range Status   Enterococcus species NOT DETECTED NOT DETECTED Final   Listeria monocytogenes NOT DETECTED NOT DETECTED Final   Staphylococcus species DETECTED (A) NOT DETECTED Final    Comment: CRITICAL RESULT CALLED TO, READ BACK BY AND VERIFIED WITH: PHARMD L POOLE [phone] MLM    Staphylococcus aureus (BCID) DETECTED (A) NOT DETECTED Final    Comment: Methicillin (oxacillin)  susceptible Staphylococcus aureus (MSSA). Preferred therapy is anti staphylococcal beta lactam antibiotic (Cefazolin or Nafcillin), unless clinically contraindicated. CRITICAL RESULT CALLED TO, READ BACK BY AND VERIFIED WITH: PHARMD L POOLE [phone] MLM    Methicillin resistance NOT DETECTED NOT DETECTED Final   Streptococcus species NOT DETECTED NOT DETECTED Final   Streptococcus agalactiae NOT DETECTED NOT DETECTED Final   Streptococcus pneumoniae NOT DETECTED NOT DETECTED Final   Streptococcus pyogenes NOT DETECTED NOT DETECTED Final   Acinetobacter baumannii NOT DETECTED NOT DETECTED Final   Enterobacteriaceae species NOT DETECTED NOT DETECTED Final   Enterobacter cloacae complex NOT DETECTED NOT DETECTED Final   Escherichia coli NOT DETECTED NOT DETECTED Final   Klebsiella oxytoca NOT DETECTED NOT DETECTED Final   Klebsiella pneumoniae NOT DETECTED NOT DETECTED Final   Proteus species NOT DETECTED NOT DETECTED Final   Serratia marcescens NOT DETECTED NOT DETECTED Final   Haemophilus influenzae NOT DETECTED NOT DETECTED Final   Neisseria meningitidis NOT DETECTED NOT DETECTED Final   Pseudomonas aeruginosa NOT DETECTED NOT DETECTED Final   Candida albicans NOT DETECTED NOT DETECTED Final   Candida glabrata NOT DETECTED NOT DETECTED Final   Candida krusei NOT DETECTED NOT DETECTED Final   Candida parapsilosis NOT DETECTED NOT DETECTED Final   Candida tropicalis NOT DETECTED NOT DETECTED Final    Comment: Performed at [facility], [address] [place], [place] [address]  Blood Culture (routine x 2)     Status: Abnormal (Preliminary result)   Collection Time: 02/08/18  1:13 PM  Result Value Ref Range Status   Specimen Description   Final    BLOOD LEFT ARM Performed at [facility], [address] [place], [place] [address]    Special Requests   Final    BOTTLES DRAWN AEROBIC AND ANAEROBIC Blood Culture adequate volume Performed at [facility], [address]  [place], [place] [address]    Culture  Setup Time   Final    IN BOTH AEROBIC AND ANAEROBIC BOTTLES GRAM POSITIVE COCCI Gram Stain Report Called to,Read Back By and Verified With: J DANIELS,RN @0230  02/09/18 [facility] Performed at [facility], [address] [place], [place] [address]    Culture STAPHYLOCOCCUS AUREUS (A)  Final   Report Status PENDING  Incomplete  Urine culture     Status: Abnormal   Collection Time: 02/08/18  1:19 PM  Result Value Ref Range  Status   Specimen Description URINE, CLEAN CATCH  Final   Special Requests   Final    NONE Performed at [facility], [address], [place], [place] [address]    Culture >=100,000 COLONIES/mL STAPHYLOCOCCUS AUREUS (A)  Final   Report Status 02/10/2018 FINAL  Final   Organism ID, Bacteria STAPHYLOCOCCUS AUREUS (A)  Final      Susceptibility   Staphylococcus aureus - MIC*    CIPROFLOXACIN <=0.5 SENSITIVE Sensitive     GENTAMICIN <=0.5 SENSITIVE Sensitive     NITROFURANTOIN <=16 SENSITIVE Sensitive     OXACILLIN <=0.25 SENSITIVE Sensitive     TETRACYCLINE <=1 SENSITIVE Sensitive     VANCOMYCIN 1 SENSITIVE Sensitive     TRIMETH/SULFA <=10 SENSITIVE Sensitive     CLINDAMYCIN RESISTANT Resistant     RIFAMPIN <=0.5 SENSITIVE Sensitive     Inducible Clindamycin POSITIVE Resistant     * >=100,000 COLONIES/mL STAPHYLOCOCCUS AUREUS  MRSA PCR Screening     Status: None   Collection Time: 02/08/18  8:51 PM  Result Value Ref Range Status   MRSA by PCR NEGATIVE NEGATIVE Final    Comment:        The GeneXpert MRSA Assay (FDA approved for NASAL specimens only), is one component of a comprehensive MRSA colonization surveillance program. It is not intended to diagnose MRSA infection nor to guide or monitor treatment for MRSA infections. Performed at [facility], [address] [place], [place] [address]      Radiology Studies: No results found.  Scheduled Meds: . diclofenac  1 patch Transdermal BID  . heparin  5,000 Units  Subcutaneous Q8H  . metoprolol tartrate  2.5 mg Intravenous Q8H  . nicotine  14 mg Transdermal Daily  . sodium chloride flush  3 mL Intravenous Q12H   Continuous Infusions: . sodium chloride 75 mL/hr at 02/10/18 1409  .  ceFAZolin (ANCEF) IV 2 g (02/10/18 1300)  . sodium chloride       LOS: 2 days    Time spent: 35 minutes.  Greater than 50% of this time was spent in direct contact with the patient, coordinating care and discussing relevant ongoing clinical issues; including discussion about: Bacteremia, positive antibodies for hepatitis C and pending test to determine if she will need treatment for that or not.  Extensive discussion regarding polysubstance abuse and providing cessation counseling.  We discussed plan of care and recommendations by infectious disease doctor.  Adjustment to her medications to help with opiate withdrawal discussed in details to have better expectation and acceptance.  All questions were answered and patient updated on echo results.     Vassie Loll, MD [facility] Pager [phone]  02/10/2018, 6:55 PM

## 2018-02-10 NOTE — Progress Notes (Signed)
  Echocardiogram 2D Echocardiogram has been performed.  Amy Mcknight 02/10/2018, 11:37 AM

## 2018-02-10 NOTE — Progress Notes (Signed)
Patient ID: Amy Mcknight, female   DOB: 1995-10-16, 23 y.o.   MRN: [id number]          [facility]    Date of Admission:  02/08/2018   Day 2 cefazolin         Ms. Dace has MSSA bacteremia complicated by cavitary pneumonia and lumbar paravertebral myositis. Results of repeat blood cultures and TTE are pending.  I recommend 6 weeks of IV cefazolin in a supervised setting, because of her active injecting drug use.  I appreciate the help of pharmacy in clarifying that she does not have a beta-lactam allergy.  She can have a PICC placed once we know blood cultures are negative at 48 hours.  Her hepatitis C antibody was positive.  I have ordered a hepatitis C quantitative viral load.  I will follow-up on Monday, 02/12/2026.  Please call me for any infectious disease questions this weekend.         Cliffton Asters, MD [facility] [phone] pager   [phone] cell 02/10/2018, 10:07 AM

## 2018-02-11 DIAGNOSIS — I079 Rheumatic tricuspid valve disease, unspecified: Secondary | ICD-10-CM | POA: Diagnosis present

## 2018-02-11 DIAGNOSIS — I368 Other nonrheumatic tricuspid valve disorders: Secondary | ICD-10-CM

## 2018-02-11 LAB — CULTURE, BLOOD (ROUTINE X 2): Special Requests: ADEQUATE

## 2018-02-11 LAB — CBC
HCT: 27.4 % — ABNORMAL LOW (ref 36.0–46.0)
HEMOGLOBIN: 8.1 g/dL — AB (ref 12.0–15.0)
MCH: 24.7 pg — ABNORMAL LOW (ref 26.0–34.0)
MCHC: 29.6 g/dL — ABNORMAL LOW (ref 30.0–36.0)
MCV: 83.5 fL (ref 80.0–100.0)
Platelets: 115 10*3/uL — ABNORMAL LOW (ref 150–400)
RBC: 3.28 MIL/uL — ABNORMAL LOW (ref 3.87–5.11)
RDW: 17.4 % — ABNORMAL HIGH (ref 11.5–15.5)
WBC: 23.8 10*3/uL — ABNORMAL HIGH (ref 4.0–10.5)
nRBC: 0.2 % (ref 0.0–0.2)

## 2018-02-11 LAB — BASIC METABOLIC PANEL
Anion gap: 9 (ref 5–15)
BUN: 11 mg/dL (ref 6–20)
CO2: 20 mmol/L — ABNORMAL LOW (ref 22–32)
Calcium: 7.3 mg/dL — ABNORMAL LOW (ref 8.9–10.3)
Chloride: 102 mmol/L (ref 98–111)
Creatinine, Ser: 0.62 mg/dL (ref 0.44–1.00)
GFR calc Af Amer: >60 mL/min (ref >60)
Glucose, Bld: 100 mg/dL — ABNORMAL HIGH (ref 70–99)
Potassium: 4.8 mmol/L (ref 3.5–5.1)
SODIUM: 131 mmol/L — AB (ref 135–145)

## 2018-02-11 MED ORDER — OXYCODONE HCL 5 MG PO TABS
10.0000 mg | ORAL_TABLET | Freq: Four times a day (QID) | ORAL | Status: DC | PRN
  Administered 2018-02-11 – 2018-02-13 (×6): 10 mg via ORAL
  Filled 2018-02-11 (×6): qty 2

## 2018-02-11 NOTE — Progress Notes (Signed)
Patient refusing to have bloodwork drawn this AM.

## 2018-02-11 NOTE — Progress Notes (Signed)
PROGRESS NOTE    Amy Mcknight  HBZ:[id number] DOB: 1995-07-20 DOA: 02/08/2018 PCP: Patient, No Pcp Per     Brief Narrative:   23 y.o. female with medical history significant for substance use disorder who presents to the ED with 2 days of right lower back pain.  Patient states symptoms began with acute onset while at rest.  Symptoms have been persistent.  She has noted associated diaphoresis, chills, shortness of breath, cough productive of brown sputum, and right chest wall pain.  She denies any rashes or obvious skin changes.  She reports good urine output without dysuria.  She denies any abdominal pain, diarrhea, or constipation.  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  She continues to use recreational drugs by snorting nasally.  She denies any alcohol use.  She reports a history of withdrawal from opiates in the past.  Assessment & Plan: 1-sepsis with early septic shock in the setting of MSSA bacteremia, tricuspid vegetation and cavitary pneumonia. -Pneumonia lesions could potentially also represent septic emboli -Case discussed with infectious disease doctor who has recommended continue supportive care, IV Ancef -Once repeat blood cultures demonstrated no growth for 48 hours PICC line can be placed. -At this moment plan is for 6 weeks of supervised IV treatment. -2D echo positive for tricuspid vegetation and moderate regurgitation.  2-AKI (acute kidney injury) (HCC) -In the setting of sepsis/prerenal azotemia -Will minimize the use of nephrotoxic agents -Continue IV fluids -Follow creatinine trend. -Creatinine down to normal range at this moment.  3-Substance use disorder -Extensive cessation counseling has been provided.    4-tobacco abuse -Extensive cessation counseling provided. -nicotine patch offered.   5-Hyponatremia -in the setting of dehydration -continue IVF's, but adjust rate as patient is eating and drinking more.  6-positive hepatitis C  antibody -Quantitative reflex test pending at this time to determine need of treatment -Will follow recommendations by ID service.  DVT prophylaxis: Heparin Code Status: Full code Family Communication: No family at bedside. Disposition Plan: Remains inpatient, continue IV fluids, continue IV Ancef.  Follow repeated blood cultures.  Continue supportive care and as needed analgesics.    Consultants:   Infectious disease over the phone (Dr. Orvan Falconer).  Procedures:   2D echo:  Fraction (55-60%), and no microscopic valve with moderate regurgitation and large vegetation.  No wall motion normalities appreciated.  Antimicrobials:  Anti-infectives (From admission, onward)   Start     Dose/Rate Route Frequency Ordered Stop   02/09/18 1500  vancomycin (VANCOCIN) IVPB 750 mg/150 ml premix  Status:  Discontinued     750 mg 150 mL/hr over 60 Minutes Intravenous Every 24 hours 02/08/18 1553 02/09/18 1201   02/09/18 1400  ceFAZolin (ANCEF) IVPB 2g/100 mL premix     2 g 200 mL/hr over 30 Minutes Intravenous Every 8 hours 02/09/18 1220     02/09/18 1300  ceFAZolin (ANCEF) 2 g in dextrose 5 % 100 mL IVPB  Status:  Discontinued     2 g 200 mL/hr over 30 Minutes Intravenous Every 8 hours 02/09/18 1201 02/09/18 1220   02/09/18 0000  meropenem (MERREM) 1 g in sodium chloride 0.9 % 100 mL IVPB  Status:  Discontinued     1 g 200 mL/hr over 30 Minutes Intravenous Every 12 hours 02/08/18 1459 02/09/18 0853   02/08/18 1445  vancomycin (VANCOCIN) IVPB 1000 mg/200 mL premix     1,000 mg 200 mL/hr over 60 Minutes Intravenous  Once 02/08/18 1442 02/08/18 1555   02/08/18  1415  meropenem (MERREM) 1 g in sodium chloride 0.9 % 100 mL IVPB     1 g 200 mL/hr over 30 Minutes Intravenous  Once 02/08/18 1402 02/08/18 1455       Subjective: Continue to demonstrate symptoms of opiate withdrawal, she is afebrile and denies chest pain, nausea and vomiting.  Still tachypneic and complaining of generalized pain.   Elevated heart rate on exam.  Objective: Vitals:   02/11/18 1500 02/11/18 1600 02/11/18 1636 02/11/18 1700  BP: 123/78 122/80  132/79  Pulse:    (!) 146  Resp: (!) 44 (!) 49  (!) 43  Temp:   99 F (37.2 C)   TempSrc:   Oral   SpO2:    90%  Weight:      Height:        Intake/Output Summary (Last 24 hours) at 02/11/2018 1705 Last data filed at 02/11/2018 0225 Gross per 24 hour  Intake 960 ml  Output -  Net 960 ml   Filed Weights   02/10/18 0500 02/11/18 0500 02/11/18 0638  Weight: 59.6 kg 63.8 kg 63.4 kg    Examination: General exam: Alert, awake, oriented x 3; continue reporting generalized pain and not feeling good.  Intermittently refusing blood work and the staff care.  Patient denies nausea, vomiting or chest pain currently.  She is afebrile. Respiratory system: Positive rhonchi, no wheezing, no crackles.  Patient with positive tachypnea in between proper doses of pain medications. Cardiovascular system: Tachycardic, no rubs, no gallops, no JVD.  Soft murmur appreciated on exam.  Gastrointestinal system: Abdomen is nondistended, soft and nontender. No organomegaly or masses felt. Normal bowel sounds heard. Central nervous system: Alert and oriented. No focal neurological deficits. Extremities: No C/C/E, +pedal pulses Skin: No rashes, lesions or ulcers Psychiatry: Judgement and insight appear normal. Mood & affect appropriate.    Data Reviewed: I have personally reviewed following labs and imaging studies  CBC: Recent Labs  Lab 02/08/18 1306 02/09/18 0640 02/11/18 1248  WBC 26.6* 26.5* 23.8*  NEUTROABS 23.2*  --   --   HGB 10.2* 9.0* 8.1*  HCT 32.8* 28.9* 27.4*  MCV 80.2 81.6 83.5  PLT 145* 136* 115*   Basic Metabolic Panel: Recent Labs  Lab 02/08/18 1306 02/09/18 0640 02/11/18 1248  NA 127* 131* 131*  K 3.7 3.6 4.8  CL 88* 99 102  CO2 24 20* 20*  GLUCOSE 117* 139* 100*  BUN 41* 30* 11  CREATININE 2.41* 1.12* 0.62  CALCIUM 7.9* 7.0* 7.3*    GFR: Estimated Creatinine Clearance: 98.1 mL/min (by C-G formula based on SCr of 0.62 mg/dL).   Liver Function Tests: Recent Labs  Lab 02/08/18 1306  AST 33  ALT 25  ALKPHOS 137*  BILITOT 0.9  PROT 7.5  ALBUMIN 2.3*   Urine analysis:    Component Value Date/Time   COLORURINE AMBER (A) 02/08/2018 1319   APPEARANCEUR CLOUDY (A) 02/08/2018 1319   LABSPEC 1.021 02/08/2018 1319   PHURINE 5.0 02/08/2018 1319   GLUCOSEU NEGATIVE 02/08/2018 1319   HGBUR NEGATIVE 02/08/2018 1319   BILIRUBINUR NEGATIVE 02/08/2018 1319   KETONESUR NEGATIVE 02/08/2018 1319   PROTEINUR NEGATIVE 02/08/2018 1319   NITRITE NEGATIVE 02/08/2018 1319   LEUKOCYTESUR MODERATE (A) 02/08/2018 1319    Recent Results (from the past 240 hour(s))  Blood Culture (routine x 2)     Status: Abnormal   Collection Time: 02/08/18  1:06 PM  Result Value Ref Range Status   Specimen Description   Final  BLOOD LEFT WRIST Performed at [facility], [address], [place], [place] [address]    Special Requests   Final    BOTTLES DRAWN AEROBIC ONLY Blood Culture results may not be optimal due to an inadequate volume of blood received in culture bottles Performed at [facility], [address] [place], [place] [address]    Culture  Setup Time   Final    AEROBIC BOTTLE ONLY GRAM POSITIVE COCCI Gram Stain Report Called to,Read Back By and Verified With: J DANIELS,RN @0228  02/09/18 MKELLY CRITICAL RESULT CALLED TO, READ BACK BY AND VERIFIED WITH: PHARMD L POOLE [id number] MLM Performed at [facility], [address], [place], [place] [address]    Culture STAPHYLOCOCCUS AUREUS (A)  Final   Report Status 02/11/2018 FINAL  Final   Organism ID, Bacteria STAPHYLOCOCCUS AUREUS  Final      Susceptibility   Staphylococcus aureus - MIC*    CIPROFLOXACIN <=0.5 SENSITIVE Sensitive     ERYTHROMYCIN >=8 RESISTANT Resistant     GENTAMICIN <=0.5 SENSITIVE Sensitive     OXACILLIN <=0.25 SENSITIVE Sensitive      TETRACYCLINE <=1 SENSITIVE Sensitive     VANCOMYCIN <=0.5 SENSITIVE Sensitive     TRIMETH/SULFA <=10 SENSITIVE Sensitive     CLINDAMYCIN RESISTANT Resistant     RIFAMPIN <=0.5 SENSITIVE Sensitive     Inducible Clindamycin POSITIVE Resistant     * STAPHYLOCOCCUS AUREUS  Blood Culture ID Panel (Reflexed)     Status: Abnormal   Collection Time: 02/08/18  1:06 PM  Result Value Ref Range Status   Enterococcus species NOT DETECTED NOT DETECTED Final   Listeria monocytogenes NOT DETECTED NOT DETECTED Final   Staphylococcus species DETECTED (A) NOT DETECTED Final    Comment: CRITICAL RESULT CALLED TO, READ BACK BY AND VERIFIED WITH: PHARMD L POOLE [id number] MLM    Staphylococcus aureus (BCID) DETECTED (A) NOT DETECTED Final    Comment: Methicillin (oxacillin) susceptible Staphylococcus aureus (MSSA). Preferred therapy is anti staphylococcal beta lactam antibiotic (Cefazolin or Nafcillin), unless clinically contraindicated. CRITICAL RESULT CALLED TO, READ BACK BY AND VERIFIED WITH: PHARMD L POOLE [id number] MLM    Methicillin resistance NOT DETECTED NOT DETECTED Final   Streptococcus species NOT DETECTED NOT DETECTED Final   Streptococcus agalactiae NOT DETECTED NOT DETECTED Final   Streptococcus pneumoniae NOT DETECTED NOT DETECTED Final   Streptococcus pyogenes NOT DETECTED NOT DETECTED Final   Acinetobacter baumannii NOT DETECTED NOT DETECTED Final   Enterobacteriaceae species NOT DETECTED NOT DETECTED Final   Enterobacter cloacae complex NOT DETECTED NOT DETECTED Final   Escherichia coli NOT DETECTED NOT DETECTED Final   Klebsiella oxytoca NOT DETECTED NOT DETECTED Final   Klebsiella pneumoniae NOT DETECTED NOT DETECTED Final   Proteus species NOT DETECTED NOT DETECTED Final   Serratia marcescens NOT DETECTED NOT DETECTED Final   Haemophilus influenzae NOT DETECTED NOT DETECTED Final   Neisseria meningitidis NOT DETECTED NOT DETECTED Final   Pseudomonas aeruginosa NOT DETECTED NOT  DETECTED Final   Candida albicans NOT DETECTED NOT DETECTED Final   Candida glabrata NOT DETECTED NOT DETECTED Final   Candida krusei NOT DETECTED NOT DETECTED Final   Candida parapsilosis NOT DETECTED NOT DETECTED Final   Candida tropicalis NOT DETECTED NOT DETECTED Final    Comment: Performed at [facility], [address], [place], [place] [address]  Blood Culture (routine x 2)     Status: Abnormal   Collection Time: 02/08/18  1:13 PM  Result Value  Ref Range Status   Specimen Description   Final    BLOOD LEFT ARM Performed at [facility], [address] [place], [place] [address]    Special Requests   Final    BOTTLES DRAWN AEROBIC AND ANAEROBIC Blood Culture adequate volume Performed at [facility], [address] [place], [place] [address]    Culture  Setup Time   Final    IN BOTH AEROBIC AND ANAEROBIC BOTTLES GRAM POSITIVE COCCI Gram Stain Report Called to,Read Back By and Verified With: J DANIELS,RN @0230  02/09/18 [facility] Performed at [facility], [address] [place], [place] [address]    Culture (A)  Final    STAPHYLOCOCCUS AUREUS SUSCEPTIBILITIES PERFORMED ON PREVIOUS CULTURE WITHIN THE LAST 5 DAYS. Performed at [facility], [address] [place], [place] [address]    Report Status 02/11/2018 FINAL  Final  Urine culture     Status: Abnormal   Collection Time: 02/08/18  1:19 PM  Result Value Ref Range Status   Specimen Description URINE, CLEAN CATCH  Final   Special Requests   Final    NONE Performed at [facility], [address] [place], [place] [address]    Culture >=100,000 COLONIES/mL STAPHYLOCOCCUS AUREUS (A)  Final   Report Status 02/10/2018 FINAL  Final   Organism ID, Bacteria STAPHYLOCOCCUS AUREUS (A)  Final      Susceptibility   Staphylococcus aureus - MIC*    CIPROFLOXACIN <=0.5 SENSITIVE Sensitive     GENTAMICIN <=0.5 SENSITIVE Sensitive     NITROFURANTOIN <=16 SENSITIVE Sensitive     OXACILLIN <=0.25 SENSITIVE Sensitive      TETRACYCLINE <=1 SENSITIVE Sensitive     VANCOMYCIN 1 SENSITIVE Sensitive     TRIMETH/SULFA <=10 SENSITIVE Sensitive     CLINDAMYCIN RESISTANT Resistant     RIFAMPIN <=0.5 SENSITIVE Sensitive     Inducible Clindamycin POSITIVE Resistant     * >=100,000 COLONIES/mL STAPHYLOCOCCUS AUREUS  MRSA PCR Screening     Status: None   Collection Time: 02/08/18  8:51 PM  Result Value Ref Range Status   MRSA by PCR NEGATIVE NEGATIVE Final    Comment:        The GeneXpert MRSA Assay (FDA approved for NASAL specimens only), is one component of a comprehensive MRSA colonization surveillance program. It is not intended to diagnose MRSA infection nor to guide or monitor treatment for MRSA infections. Performed at [facility], [address] [place], [place] [address]   Culture, blood (routine x 2)     Status: None (Preliminary result)   Collection Time: 02/11/18 12:59 PM  Result Value Ref Range Status   Specimen Description   Final    BLOOD LEFT HAND BOTTLES DRAWN AEROBIC AND ANAEROBIC   Special Requests   Final    Blood Culture adequate volume Performed at [facility], [address] [place], [place] [address]    Culture PENDING  Incomplete   Report Status PENDING  Incomplete     Radiology Studies: No results found.  Scheduled Meds: . diclofenac  1 patch Transdermal BID  . heparin  5,000 Units Subcutaneous Q8H  . metoprolol tartrate  2.5 mg Intravenous Q8H  . nicotine  14 mg Transdermal Daily  . sodium chloride flush  3 mL Intravenous Q12H   Continuous Infusions: .  ceFAZolin (ANCEF) IV 2 g (02/11/18 1431)  . sodium chloride       LOS: 3 days    Time spent: 35 minutes.  Greater than 50%  of this time was spent in direct contact with the patient, coordinating care and discussing relevant ongoing clinical issues; including further discussion about MSSA bacteremia, abnormal findings for hepatitis C antibody and importance of blood work to further facilitate her care and adjustment  of medications.  Once again discussion regarding polysubstance abuse and cessation counseling provided.   Vassie Loll, MD [facility] Pager [phone]  02/11/2018, 5:05 PM

## 2018-02-11 NOTE — Progress Notes (Signed)
Patient refuses to keep O2 sat monitor on at this time. Pt adamant that her pain medication is scheduled and should be brought in as her other medications are. RN attempted to explain that the pain medication is ordered prn and is as needed for pain. RN explained that PRN medications are given if there is a need for them and not just on a schedule between doses. Patient continued to argue that her medication is supposed to be scheduled. Rn informed patient that MD orders will be followed.

## 2018-02-12 LAB — HCV RNA QUANT: HCV Quantitative: NOT DETECTED IU/mL (ref >50)

## 2018-02-12 MED ORDER — GUAIFENESIN-DM 100-10 MG/5ML PO SYRP
5.0000 mL | ORAL_SOLUTION | ORAL | Status: DC | PRN
  Administered 2018-02-13 – 2018-03-24 (×33): 5 mL via ORAL
  Filled 2018-02-12 (×37): qty 5

## 2018-02-12 NOTE — Progress Notes (Signed)
Pharmacy Antibiotic Note  Amy Mcknight is a 23 y.o. female admitted on 02/08/2018 with bacteremia.  Pharmacy has been consulted for Cefazolin dosing. MSSA bacteremia complicated by tricuspid vegetation and cavitary pneumonia.and lumbar paravertebral myositis. She continues with low grade temps. Repeat Blood cultures done 2/9. ID recommends 6 weeks IV antibiotics in a supervised setting, because of her active injecting drug use.  Plan: Continue Cefazolin 2gm IV q8h F/U cxs and clinical progress Monitor V/S and labs  Height: 5\' 3"  (160 cm) Weight: 140 lb 10.5 oz (63.8 kg) IBW/kg (Calculated) : 52.4  Temp (24hrs), Avg:99.9 F (37.7 C), Min:99 F (37.2 C), Max:100.3 F (37.9 C)  Recent Labs  Lab 02/08/18 1306 02/08/18 1541 02/08/18 1953 02/09/18 0640 02/11/18 1248  WBC 26.6*  --   --  26.5* 23.8*  CREATININE 2.41*  --   --  1.12* 0.62  LATICACIDVEN 2.1* 2.9* 2.8*  --   --     Estimated Creatinine Clearance: 98.4 mL/min (by C-G formula based on SCr of 0.62 mg/dL).    Allergies  Allergen Reactions  . Penicillins Other (See Comments)    Diaper rash as an infant per mother    Antimicrobials this admission: Cefazolin 2/7>> Merrem 2/6 >> 2/7 Vanco 2/6 >>2/7  Dose adjustments this admission: N/A  Microbiology results: 2/6 BCx: MSSA s- oxacillin, TCN, vanco  R-clindamycin 2/9 BCx. NGTD 2/6 UCx: >100K CFU/ml MSSA  s- oxacillin  r-clindamycin 2/6 MRSA PCR is negative hepatitis C antibody was positive   Thank you for allowing pharmacy to be a part of this patient's care.  Elder Cyphers, BS Pharm D, [place] Clinical Pharmacist Pager [phone] 02/12/2018 8:35 AM

## 2018-02-12 NOTE — Progress Notes (Signed)
PROGRESS NOTE    Amy Mcknight  ZOX:[id number] DOB: 1995-05-09 DOA: 02/08/2018 PCP: Patient, No Pcp Per     Brief Narrative:   23 y.o. female with medical history significant for substance use disorder who presents to the ED with 2 days of right lower back pain.  Patient states symptoms began with acute onset while at rest.  Symptoms have been persistent.  She has noted associated diaphoresis, chills, shortness of breath, cough productive of brown sputum, and right chest wall pain.  She denies any rashes or obvious skin changes.  She reports good urine output without dysuria.  She denies any abdominal pain, diarrhea, or constipation.  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  She continues to use recreational drugs by snorting nasally.  She denies any alcohol use.  She reports a history of withdrawal from opiates in the past.  Assessment & Plan: 1-sepsis with early septic shock in the setting of MSSA bacteremia, tricuspid vegetation, paravertebral myositis and cavitary pneumonia. -Pneumonia lesions could also represent septic emboli -Case discussed with infectious disease doctor who has recommended continue supportive care and continue IV Ancef -Once repeat blood cultures demonstrated no growth for 48 hours PICC line can be place. So far cx's continue to be positive  -At this moment plan is for 6 weeks of supervised IV treatment. -2D echo positive for tricuspid vegetation and moderate regurgitation.  2-AKI (acute kidney injury) (HCC) -In the setting of sepsis/prerenal azotemia -Will minimize the use of nephrotoxic agents -Continue IV fluids -Follow creatinine trend intermittently. -Creatinine down to normal range at this moment.  3-Substance use disorder -Extensive cessation counseling has been provided.   -Patient was receptive, but has not really expressed future decisions about quitting recreational drugs.  4-tobacco abuse -Extensive cessation counseling  provided. -nicotine patch offered.   5-Hyponatremia -in the setting of dehydration -will continue IVF's at adjusted rate -Sodium 131 currently.  6-positive hepatitis C antibody -Quantitative viral load test undetectable; suggesting that the patient was able to clear infection on her own -No treatment needed  -Patient educated about how these diseases transmitted and counseling provided regarding safe sex practices and to stop the use of IV drugs.  DVT prophylaxis: Heparin Code Status: Full code Family Communication: No family at bedside. Disposition Plan: Remains inpatient, continue IV fluids, continue IV Ancef.  Follow repeated blood cultures.  Continue supportive care and as needed analgesics.    Consultants:   Infectious disease over the phone (Dr. Orvan Falconer).  Procedures:   2D echo:  Fraction (55-60%), and no microscopic valve with moderate regurgitation and large vegetation.  No wall motion normalities appreciated.  Antimicrobials:  Anti-infectives (From admission, onward)   Start     Dose/Rate Route Frequency Ordered Stop   02/09/18 1500  vancomycin (VANCOCIN) IVPB 750 mg/150 ml premix  Status:  Discontinued     750 mg 150 mL/hr over 60 Minutes Intravenous Every 24 hours 02/08/18 1553 02/09/18 1201   02/09/18 1400  ceFAZolin (ANCEF) IVPB 2g/100 mL premix     2 g 200 mL/hr over 30 Minutes Intravenous Every 8 hours 02/09/18 1220     02/09/18 1300  ceFAZolin (ANCEF) 2 g in dextrose 5 % 100 mL IVPB  Status:  Discontinued     2 g 200 mL/hr over 30 Minutes Intravenous Every 8 hours 02/09/18 1201 02/09/18 1220   02/09/18 0000  meropenem (MERREM) 1 g in sodium chloride 0.9 % 100 mL IVPB  Status:  Discontinued  1 g 200 mL/hr over 30 Minutes Intravenous Every 12 hours 02/08/18 1459 02/09/18 0853   02/08/18 1445  vancomycin (VANCOCIN) IVPB 1000 mg/200 mL premix     1,000 mg 200 mL/hr over 60 Minutes Intravenous  Once 02/08/18 1442 02/08/18 1555   02/08/18 1415  meropenem  (MERREM) 1 g in sodium chloride 0.9 % 100 mL IVPB     1 g 200 mL/hr over 30 Minutes Intravenous  Once 02/08/18 1402 02/08/18 1455       Subjective: Continued to be tachycardic and tachypneic in between pain medications.  Patient denies nausea, vomiting, abdominal pain or fever.  Objective: Vitals:   02/12/18 1000 02/12/18 1100 02/12/18 1118 02/12/18 1200  BP: 126/83 128/87  126/81  Pulse: (!) 133 (!) 133 (!) 134 (!) 137  Resp: (!) 36 (!) 35 (!) 25 (!) 39  Temp:   99 F (37.2 C)   TempSrc:   Oral   SpO2: 92% 100% 100% 100%  Weight:      Height:        Intake/Output Summary (Last 24 hours) at 02/12/2018 1515 Last data filed at 02/12/2018 0935 Gross per 24 hour  Intake 3 ml  Output 2250 ml  Net -2247 ml   Filed Weights   02/11/18 0500 02/11/18 0638 02/12/18 0500  Weight: 63.8 kg 63.4 kg 63.8 kg    Examination: General exam: Alert, awake, oriented x 3; afebrile, continue complaining of diffuse/generalized pain.  Positive tachypnea, positive tachycardia. Respiratory system: Positive rhonchi, no wheezing, no crackles.  Cardiovascular system: Sinus tachycardia, positive soft murmur, no rubs, no gallops.   Gastrointestinal system: Abdomen is nondistended, soft and nontender. No organomegaly or masses felt. Normal bowel sounds heard. Central nervous system: Alert and oriented. No focal neurological deficits. Extremities: No C/C/E, +pedal pulses Skin: No rashes, lesions or ulcers Psychiatry: Judgement and insight appear normal. Mood & affect appropriate.    Data Reviewed: I have personally reviewed following labs and imaging studies  CBC: Recent Labs  Lab 02/08/18 1306 02/09/18 0640 02/11/18 1248  WBC 26.6* 26.5* 23.8*  NEUTROABS 23.2*  --   --   HGB 10.2* 9.0* 8.1*  HCT 32.8* 28.9* 27.4*  MCV 80.2 81.6 83.5  PLT 145* 136* 115*   Basic Metabolic Panel: Recent Labs  Lab 02/08/18 1306 02/09/18 0640 02/11/18 1248  NA 127* 131* 131*  K 3.7 3.6 4.8  CL 88* 99 102   CO2 24 20* 20*  GLUCOSE 117* 139* 100*  BUN 41* 30* 11  CREATININE 2.41* 1.12* 0.62  CALCIUM 7.9* 7.0* 7.3*   GFR: Estimated Creatinine Clearance: 98.4 mL/min (by C-G formula based on SCr of 0.62 mg/dL).   Liver Function Tests: Recent Labs  Lab 02/08/18 1306  AST 33  ALT 25  ALKPHOS 137*  BILITOT 0.9  PROT 7.5  ALBUMIN 2.3*   Urine analysis:    Component Value Date/Time   COLORURINE AMBER (A) 02/08/2018 1319   APPEARANCEUR CLOUDY (A) 02/08/2018 1319   LABSPEC 1.021 02/08/2018 1319   PHURINE 5.0 02/08/2018 1319   GLUCOSEU NEGATIVE 02/08/2018 1319   HGBUR NEGATIVE 02/08/2018 1319   BILIRUBINUR NEGATIVE 02/08/2018 1319   KETONESUR NEGATIVE 02/08/2018 1319   PROTEINUR NEGATIVE 02/08/2018 1319   NITRITE NEGATIVE 02/08/2018 1319   LEUKOCYTESUR MODERATE (A) 02/08/2018 1319    Recent Results (from the past 240 hour(s))  Blood Culture (routine x 2)     Status: Abnormal   Collection Time: 02/08/18  1:06 PM  Result Value Ref Range  Status   Specimen Description   Final    BLOOD LEFT WRIST Performed at [facility], [address] [place], [place] [address]    Special Requests   Final    BOTTLES DRAWN AEROBIC ONLY Blood Culture results may not be optimal due to an inadequate volume of blood received in culture bottles Performed at [facility], [address], [place], [place] [address]    Culture  Setup Time   Final    AEROBIC BOTTLE ONLY GRAM POSITIVE COCCI Gram Stain Report Called to,Read Back By and Verified With: J DANIELS,RN @0228  02/09/18 MKELLY CRITICAL RESULT CALLED TO, READ BACK BY AND VERIFIED WITH: PHARMD L POOLE [id number] MLM Performed at [facility], [address] [place], [place] [address]    Culture STAPHYLOCOCCUS AUREUS (A)  Final   Report Status 02/11/2018 FINAL  Final   Organism ID, Bacteria STAPHYLOCOCCUS AUREUS  Final      Susceptibility   Staphylococcus aureus - MIC*    CIPROFLOXACIN <=0.5 SENSITIVE Sensitive     ERYTHROMYCIN >=8  RESISTANT Resistant     GENTAMICIN <=0.5 SENSITIVE Sensitive     OXACILLIN <=0.25 SENSITIVE Sensitive     TETRACYCLINE <=1 SENSITIVE Sensitive     VANCOMYCIN <=0.5 SENSITIVE Sensitive     TRIMETH/SULFA <=10 SENSITIVE Sensitive     CLINDAMYCIN RESISTANT Resistant     RIFAMPIN <=0.5 SENSITIVE Sensitive     Inducible Clindamycin POSITIVE Resistant     * STAPHYLOCOCCUS AUREUS  Blood Culture ID Panel (Reflexed)     Status: Abnormal   Collection Time: 02/08/18  1:06 PM  Result Value Ref Range Status   Enterococcus species NOT DETECTED NOT DETECTED Final   Listeria monocytogenes NOT DETECTED NOT DETECTED Final   Staphylococcus species DETECTED (A) NOT DETECTED Final    Comment: CRITICAL RESULT CALLED TO, READ BACK BY AND VERIFIED WITH: PHARMD L POOLE [id number] MLM    Staphylococcus aureus (BCID) DETECTED (A) NOT DETECTED Final    Comment: Methicillin (oxacillin) susceptible Staphylococcus aureus (MSSA). Preferred therapy is anti staphylococcal beta lactam antibiotic (Cefazolin or Nafcillin), unless clinically contraindicated. CRITICAL RESULT CALLED TO, READ BACK BY AND VERIFIED WITH: PHARMD L POOLE [id number] MLM    Methicillin resistance NOT DETECTED NOT DETECTED Final   Streptococcus species NOT DETECTED NOT DETECTED Final   Streptococcus agalactiae NOT DETECTED NOT DETECTED Final   Streptococcus pneumoniae NOT DETECTED NOT DETECTED Final   Streptococcus pyogenes NOT DETECTED NOT DETECTED Final   Acinetobacter baumannii NOT DETECTED NOT DETECTED Final   Enterobacteriaceae species NOT DETECTED NOT DETECTED Final   Enterobacter cloacae complex NOT DETECTED NOT DETECTED Final   Escherichia coli NOT DETECTED NOT DETECTED Final   Klebsiella oxytoca NOT DETECTED NOT DETECTED Final   Klebsiella pneumoniae NOT DETECTED NOT DETECTED Final   Proteus species NOT DETECTED NOT DETECTED Final   Serratia marcescens NOT DETECTED NOT DETECTED Final   Haemophilus influenzae NOT DETECTED NOT DETECTED  Final   Neisseria meningitidis NOT DETECTED NOT DETECTED Final   Pseudomonas aeruginosa NOT DETECTED NOT DETECTED Final   Candida albicans NOT DETECTED NOT DETECTED Final   Candida glabrata NOT DETECTED NOT DETECTED Final   Candida krusei NOT DETECTED NOT DETECTED Final   Candida parapsilosis NOT DETECTED NOT DETECTED Final   Candida tropicalis NOT DETECTED NOT DETECTED Final    Comment: Performed at [facility], [address], [place], [place] [address]  Blood Culture (routine x 2)     Status: Abnormal  Collection Time: 02/08/18  1:13 PM  Result Value Ref Range Status   Specimen Description   Final    BLOOD LEFT ARM Performed at [facility], [address] [place], [place] [address]    Special Requests   Final    BOTTLES DRAWN AEROBIC AND ANAEROBIC Blood Culture adequate volume Performed at [facility], [address] [place], [place] [address]    Culture  Setup Time   Final    IN BOTH AEROBIC AND ANAEROBIC BOTTLES GRAM POSITIVE COCCI Gram Stain Report Called to,Read Back By and Verified With: J DANIELS,RN @0230  02/09/18 [facility] Performed at [facility], [address] [place], [place] [address]    Culture (A)  Final    STAPHYLOCOCCUS AUREUS SUSCEPTIBILITIES PERFORMED ON PREVIOUS CULTURE WITHIN THE LAST 5 DAYS. Performed at [facility], [address] [place], [place] [address]    Report Status 02/11/2018 FINAL  Final  Urine culture     Status: Abnormal   Collection Time: 02/08/18  1:19 PM  Result Value Ref Range Status   Specimen Description URINE, CLEAN CATCH  Final   Special Requests   Final    NONE Performed at [facility], [address] [place], [place] [address]    Culture >=100,000 COLONIES/mL STAPHYLOCOCCUS AUREUS (A)  Final   Report Status 02/10/2018 FINAL  Final   Organism ID, Bacteria STAPHYLOCOCCUS AUREUS (A)  Final      Susceptibility   Staphylococcus aureus - MIC*    CIPROFLOXACIN <=0.5 SENSITIVE Sensitive     GENTAMICIN <=0.5  SENSITIVE Sensitive     NITROFURANTOIN <=16 SENSITIVE Sensitive     OXACILLIN <=0.25 SENSITIVE Sensitive     TETRACYCLINE <=1 SENSITIVE Sensitive     VANCOMYCIN 1 SENSITIVE Sensitive     TRIMETH/SULFA <=10 SENSITIVE Sensitive     CLINDAMYCIN RESISTANT Resistant     RIFAMPIN <=0.5 SENSITIVE Sensitive     Inducible Clindamycin POSITIVE Resistant     * >=100,000 COLONIES/mL STAPHYLOCOCCUS AUREUS  MRSA PCR Screening     Status: None   Collection Time: 02/08/18  8:51 PM  Result Value Ref Range Status   MRSA by PCR NEGATIVE NEGATIVE Final    Comment:        The GeneXpert MRSA Assay (FDA approved for NASAL specimens only), is one component of a comprehensive MRSA colonization surveillance program. It is not intended to diagnose MRSA infection nor to guide or monitor treatment for MRSA infections. Performed at [facility], [address], [place], [place] [address]   Culture, blood (routine x 2)     Status: None (Preliminary result)   Collection Time: 02/11/18 12:59 PM  Result Value Ref Range Status   Specimen Description   Final    BLOOD LEFT HAND BOTTLES DRAWN AEROBIC AND ANAEROBIC Performed at [facility], [address], [place], [place] [address]    Special Requests   Final    Blood Culture adequate volume Performed at [facility] [address] [place], [place] [address]    Culture  Setup Time   Final    GRAM POSITIVE COCCI IN CLUSTERS RECOVERED FROM THE ANAEROBIC BOTTLE Gram Stain Report Called to,Read Back By and Verified With: MOTLEY,J. AT 1048 ON 02/12/2018 BY BAUGHAM,M. Performed at [facility] CRITICAL RESULT CALLED TO, READ BACK BY AND VERIFIED WITH: HURTH PHARMD AT 1402 ON [id number] BY SJW Performed at [facility], [address], [place], [place] [address]    Culture GRAM POSITIVE COCCI  Final   Report Status PENDING  Incomplete     Radiology Studies: No results found.  Scheduled Meds: . diclofenac  1 patch Transdermal BID  . heparin  5,000  Units Subcutaneous Q8H  . metoprolol tartrate  2.5 mg Intravenous Q8H  . nicotine  14 mg Transdermal Daily  . sodium chloride flush  3 mL Intravenous Q12H   Continuous Infusions: .  ceFAZolin (ANCEF) IV 2 g (02/12/18 1425)  . sodium chloride       LOS: 4 days    Time spent: 35 minutes.  Greater than 50% of this time was spent in direct contact with the patient, coordinating care and discussing relevant ongoing clinical issues; we had discussion about ongoing MSSA bacteremia, need for 6 weeks of IV antibiotics and the fact that her blood culture has remained positive; which means that we cannot put PICC line yet.  We have a long conversation regarding the need for [employer] from care what is time for blood work or any other staff care.  Hepatitis C viral load was undetectable indicated that she was able to spontaneously cleared the infection, no treatment is needed for that at this time.  TTE demonstrated tricuspid valve endocarditis, CXR/CT with cavitary pneumonia and lumbar paravertebral myositis.  Continue IV Ancef with anticipated 6 weeks of supervised treatment.   Vassie Loll, MD [facility] Pager [phone]  02/12/2018, 3:15 PM

## 2018-02-12 NOTE — Progress Notes (Signed)
Pt states she has been using heroin for about 6 years and currently uses around 100mg  each day.

## 2018-02-12 NOTE — Progress Notes (Addendum)
Patient ID: Amy Mcknight, female   DOB: 23-May-1995, 23 y.o.   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  Repeat blood cultures yesterday are still positive.  I have ordered repeat blood cultures again for today.  I will hold off on PICC placement for now.  She will need at least 6 weeks of IV cefazolin therapy in a supervised setting.  On a good note her hepatitis C viral load is undetectable indicating that she was able to spontaneously clear her infection.  She does not need any treatment.  She does need to be informed that she can be reinfected.        Cliffton Asters, MD [facility] [phone] pager   [phone] cell 02/12/2018, 2:04 PM

## 2018-02-13 MED ORDER — SODIUM CHLORIDE 0.9 % IV SOLN
INTRAVENOUS | Status: DC
  Administered 2018-02-13: 11:00:00 via INTRAVENOUS

## 2018-02-13 MED ORDER — OXYCODONE HCL 5 MG PO TABS
15.0000 mg | ORAL_TABLET | Freq: Four times a day (QID) | ORAL | Status: DC | PRN
  Administered 2018-02-13 – 2018-02-19 (×18): 15 mg via ORAL
  Filled 2018-02-13 (×19): qty 3

## 2018-02-13 NOTE — Care Management Note (Signed)
Case Management Note  Patient Details  Name: Amy Mcknight MRN: [id number] Date of Birth: August 04, 1995   If discussed at Long Length of Stay Meetings, dates discussed:  02/13/18  Additional Comments:  Izzie Geers, Chrystine Oiler, RN 02/13/2018, 1:00 PM

## 2018-02-13 NOTE — Progress Notes (Signed)
Text paged Dr. Gwenlyn Perking making him aware that patient heart rate has been in the 130-140's even after scheduled lopressor IV push. Also made aware that respirations have been running in the 30's-upper 40's at times. O2 sat remains stable and in the high 90's on room air.

## 2018-02-13 NOTE — Progress Notes (Signed)
PROGRESS NOTE    Amy Mcknight  UJW:[id number] DOB: Oct 21, 1995 DOA: 02/08/2018 PCP: Patient, No Pcp Per     Brief Narrative:   23 y.o. female with medical history significant for substance use disorder who presents to the ED with 2 days of right lower back pain.  Patient states symptoms began with acute onset while at rest.  Symptoms have been persistent.  She has noted associated diaphoresis, chills, shortness of breath, cough productive of brown sputum, and right chest wall pain.  She denies any rashes or obvious skin changes.  She reports good urine output without dysuria.  She denies any abdominal pain, diarrhea, or constipation.  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  She continues to use recreational drugs by snorting nasally.  She denies any alcohol use.  She reports a history of withdrawal from opiates in the past.  Assessment & Plan: 1-sepsis with early septic shock in the setting of MSSA bacteremia, tricuspid vegetation, paravertebral myositis and cavitary pneumonia. -Pneumonia lesions could also represent septic emboli -Case discussed with infectious disease doctor who has recommended continue supportive care and continue IV Ancef -Once repeat blood cultures demonstrated no growth for 48 hours PICC line can be place. So far cx's continue to be positive  -At this moment plan is for 6 weeks of supervised IV treatment. -2D echo positive for tricuspid vegetation and moderate regurgitation.  2-AKI (acute kidney injury) (HCC) -In the setting of sepsis/prerenal azotemia -Will minimize the use of nephrotoxic agents -Continue IV fluids -Follow creatinine trend intermittently. -Creatinine down to normal range at this moment.  3-Substance use disorder/opiate withdrawal syndrome -Extensive cessation counseling has been provided.   -Patient was receptive, but has not really expressed future decisions about quitting recreational drugs.. -Continue adjusted dose of  narcotics. -Patient expressing pain all over especially in her back.  4-tobacco abuse -Extensive cessation counseling provided. -nicotine patch offered.   5-Hyponatremia -in the setting of dehydration -will continue IVF's at adjusted rate -Sodium 131 currently.  6-positive hepatitis C antibody -Quantitative viral load test undetectable; suggesting that the patient was able to clear infection on her own -No treatment needed  -Patient educated about how these diseases transmitted and counseling provided regarding safe sex practices and to stop the use of IV drugs.  7-bilateral pedal edema -Apply TED hoses -Adjust IV fluid rates.  DVT prophylaxis: Heparin Code Status: Full code Family Communication: No family at bedside. Disposition Plan: Remains inpatient, transfer to telemetry, continue IV fluids (at adjusted rate), continue IV Ancef.  Follow repeated blood cultures.  Continue supportive care and as needed analgesics.    Consultants:   Infectious disease over the phone (Dr. Orvan Falconerampbell).  Procedures:   2D echo:  Fraction (55-60%), and no microscopic valve with moderate regurgitation and large vegetation.  No wall motion normalities appreciated.  Antimicrobials:  Anti-infectives (From admission, onward)   Start     Dose/Rate Route Frequency Ordered Stop   02/09/18 1500  vancomycin (VANCOCIN) IVPB 750 mg/150 ml premix  Status:  Discontinued     750 mg 150 mL/hr over 60 Minutes Intravenous Every 24 hours 02/08/18 1553 02/09/18 1201   02/09/18 1400  ceFAZolin (ANCEF) IVPB 2g/100 mL premix     2 g 200 mL/hr over 30 Minutes Intravenous Every 8 hours 02/09/18 1220     02/09/18 1300  ceFAZolin (ANCEF) 2 g in dextrose 5 % 100 mL IVPB  Status:  Discontinued     2 g 200 mL/hr over 30 Minutes  Intravenous Every 8 hours 02/09/18 1201 02/09/18 1220   02/09/18 0000  meropenem (MERREM) 1 g in sodium chloride 0.9 % 100 mL IVPB  Status:  Discontinued     1 g 200 mL/hr over 30 Minutes  Intravenous Every 12 hours 02/08/18 1459 02/09/18 0853   02/08/18 1445  vancomycin (VANCOCIN) IVPB 1000 mg/200 mL premix     1,000 mg 200 mL/hr over 60 Minutes Intravenous  Once 02/08/18 1442 02/08/18 1555   02/08/18 1415  meropenem (MERREM) 1 g in sodium chloride 0.9 % 100 mL IVPB     1 g 200 mL/hr over 30 Minutes Intravenous  Once 02/08/18 1402 02/08/18 1455       Subjective: Continues to experience intermittent episode of tachypnea and tachycardia; she is no longer febrile.  Blood culture has remained to be positive and she is denying nausea, vomiting, abdominal pain, shortness of breath.  Objective: Vitals:   02/13/18 0700 02/13/18 0800 02/13/18 0900 02/13/18 1000  BP: 108/79 114/80 121/79 124/76  Pulse: (!) 108 79 (!) 51   Resp: (!) 39 (!) 28 (!) 43 (!) 41  Temp: 98.1 F (36.7 C)     TempSrc: Oral     SpO2: 98% 94% 95%   Weight:      Height:        Intake/Output Summary (Last 24 hours) at 02/13/2018 1101 Last data filed at 02/13/2018 0900 Gross per 24 hour  Intake 1040 ml  Output -  Net 1040 ml   Filed Weights   02/11/18 0638 02/12/18 0500 02/13/18 0400  Weight: 63.4 kg 63.8 kg 61.1 kg    Examination: General exam: Alert, awake, oriented x 3; demonstrating good insight about current clinical situation and need for treatment.  Patient denies nausea, abdominal pain, dysuria and shortness of breath.  She is afebrile.  Continue to express generalized/diffuse pain and is also demonstrating positive tachypnea and tachycardia (intermittently in between pain medications dosages). Respiratory system: Positive for scattered rhonchi, no wheezing, no crackles.  No using accessory muscles.  Positive tachypnea appreciated on exam.  Cardiovascular system: Sinus tachycardia, soft systolic murmur appreciated on exam, no rubs, no gallops, no JVD.  Gastrointestinal system: Abdomen is nondistended, soft and nontender. No organomegaly or masses felt. Normal bowel sounds heard. Central  nervous system: Alert and oriented. No focal neurological deficits. Extremities: No cyanosis or clubbing; 1+ pedal edema bilaterally appreciated. Skin: No rashes, no petechiae. Psychiatry: Judgement and insight appear normal. Mood & affect appropriate.   Data Reviewed: I have personally reviewed following labs and imaging studies  CBC: Recent Labs  Lab 02/08/18 1306 02/09/18 0640 02/11/18 1248  WBC 26.6* 26.5* 23.8*  NEUTROABS 23.2*  --   --   HGB 10.2* 9.0* 8.1*  HCT 32.8* 28.9* 27.4*  MCV 80.2 81.6 83.5  PLT 145* 136* 115*   Basic Metabolic Panel: Recent Labs  Lab 02/08/18 1306 02/09/18 0640 02/11/18 1248  NA 127* 131* 131*  K 3.7 3.6 4.8  CL 88* 99 102  CO2 24 20* 20*  GLUCOSE 117* 139* 100*  BUN 41* 30* 11  CREATININE 2.41* 1.12* 0.62  CALCIUM 7.9* 7.0* 7.3*   GFR: Estimated Creatinine Clearance: 90.5 mL/min (by C-G formula based on SCr of 0.62 mg/dL).   Liver Function Tests: Recent Labs  Lab 02/08/18 1306  AST 33  ALT 25  ALKPHOS 137*  BILITOT 0.9  PROT 7.5  ALBUMIN 2.3*   Urine analysis:    Component Value Date/Time   COLORURINE AMBER (A)  02/08/2018 1319   APPEARANCEUR CLOUDY (A) 02/08/2018 1319   LABSPEC 1.021 02/08/2018 1319   PHURINE 5.0 02/08/2018 1319   GLUCOSEU NEGATIVE 02/08/2018 1319   HGBUR NEGATIVE 02/08/2018 1319   BILIRUBINUR NEGATIVE 02/08/2018 1319   KETONESUR NEGATIVE 02/08/2018 1319   PROTEINUR NEGATIVE 02/08/2018 1319   NITRITE NEGATIVE 02/08/2018 1319   LEUKOCYTESUR MODERATE (A) 02/08/2018 1319    Recent Results (from the past 240 hour(s))  Blood Culture (routine x 2)     Status: Abnormal   Collection Time: 02/08/18  1:06 PM  Result Value Ref Range Status   Specimen Description   Final    BLOOD LEFT WRIST Performed at [facility], [address], [place], [place] [address]    Special Requests   Final    BOTTLES DRAWN AEROBIC ONLY Blood Culture results may not be optimal due to an inadequate volume of blood received in  culture bottles Performed at [facility], [address] [place], [place] [address]    Culture  Setup Time   Final    AEROBIC BOTTLE ONLY GRAM POSITIVE COCCI Gram Stain Report Called to,Read Back By and Verified With: J DANIELS,RN @0228  02/09/18 [facility]LLY CRITICAL RESULT CALLED TO, READ BACK BY AND VERIFIED WITH: PHARMD L POOLE [id number] MLM Performed at [facility], [address], [place], [place] [address]    Culture STAPHYLOCOCCUS AUREUS (A)  Final   Report Status 02/11/2018 FINAL  Final   Organism ID, Bacteria STAPHYLOCOCCUS AUREUS  Final      Susceptibility   Staphylococcus aureus - MIC*    CIPROFLOXACIN <=0.5 SENSITIVE Sensitive     ERYTHROMYCIN >=8 RESISTANT Resistant     GENTAMICIN <=0.5 SENSITIVE Sensitive     OXACILLIN <=0.25 SENSITIVE Sensitive     TETRACYCLINE <=1 SENSITIVE Sensitive     VANCOMYCIN <=0.5 SENSITIVE Sensitive     TRIMETH/SULFA <=10 SENSITIVE Sensitive     CLINDAMYCIN RESISTANT Resistant     RIFAMPIN <=0.5 SENSITIVE Sensitive     Inducible Clindamycin POSITIVE Resistant     * STAPHYLOCOCCUS AUREUS  Blood Culture ID Panel (Reflexed)     Status: Abnormal   Collection Time: 02/08/18  1:06 PM  Result Value Ref Range Status   Enterococcus species NOT DETECTED NOT DETECTED Final   Listeria monocytogenes NOT DETECTED NOT DETECTED Final   Staphylococcus species DETECTED (A) NOT DETECTED Final    Comment: CRITICAL RESULT CALLED TO, READ BACK BY AND VERIFIED WITH: PHARMD L POOLE [id number] MLM    Staphylococcus aureus (BCID) DETECTED (A) NOT DETECTED Final    Comment: Methicillin (oxacillin) susceptible Staphylococcus aureus (MSSA). Preferred therapy is anti staphylococcal beta lactam antibiotic (Cefazolin or Nafcillin), unless clinically contraindicated. CRITICAL RESULT CALLED TO, READ BACK BY AND VERIFIED WITH: PHARMD L POOLE [id number] MLM    Methicillin resistance NOT DETECTED NOT DETECTED Final   Streptococcus species NOT DETECTED NOT DETECTED Final     Streptococcus agalactiae NOT DETECTED NOT DETECTED Final   Streptococcus pneumoniae NOT DETECTED NOT DETECTED Final   Streptococcus pyogenes NOT DETECTED NOT DETECTED Final   Acinetobacter baumannii NOT DETECTED NOT DETECTED Final   Enterobacteriaceae species NOT DETECTED NOT DETECTED Final   Enterobacter cloacae complex NOT DETECTED NOT DETECTED Final   Escherichia coli NOT DETECTED NOT DETECTED Final   Klebsiella oxytoca NOT DETECTED NOT DETECTED Final   Klebsiella pneumoniae NOT DETECTED NOT DETECTED Final   Proteus species NOT DETECTED NOT DETECTED Final   Serratia marcescens NOT DETECTED NOT DETECTED Final  Haemophilus influenzae NOT DETECTED NOT DETECTED Final   Neisseria meningitidis NOT DETECTED NOT DETECTED Final   Pseudomonas aeruginosa NOT DETECTED NOT DETECTED Final   Candida albicans NOT DETECTED NOT DETECTED Final   Candida glabrata NOT DETECTED NOT DETECTED Final   Candida krusei NOT DETECTED NOT DETECTED Final   Candida parapsilosis NOT DETECTED NOT DETECTED Final   Candida tropicalis NOT DETECTED NOT DETECTED Final    Comment: Performed at [facility], [address], [place], [place] [address]  Blood Culture (routine x 2)     Status: Abnormal   Collection Time: 02/08/18  1:13 PM  Result Value Ref Range Status   Specimen Description   Final    BLOOD LEFT ARM Performed at [facility], [address], [place], [place] [address]    Special Requests   Final    BOTTLES DRAWN AEROBIC AND ANAEROBIC Blood Culture adequate volume Performed at [facility], [address], [place], [place] [address]    Culture  Setup Time   Final    IN BOTH AEROBIC AND ANAEROBIC BOTTLES GRAM POSITIVE COCCI Gram Stain Report Called to,Read Back By and Verified With: J DANIELS,RN @0230  02/09/18 [facility] Performed at [facility], [address], [place], [place] [address]    Culture (A)  Final    STAPHYLOCOCCUS AUREUS SUSCEPTIBILITIES PERFORMED ON PREVIOUS CULTURE WITHIN THE LAST 5  DAYS. Performed at [facility], [address] [place], [place] [address]    Report Status 02/11/2018 FINAL  Final  Urine culture     Status: Abnormal   Collection Time: 02/08/18  1:19 PM  Result Value Ref Range Status   Specimen Description URINE, CLEAN CATCH  Final   Special Requests   Final    NONE Performed at [facility], [address], [place], [place] [address]    Culture >=100,000 COLONIES/mL STAPHYLOCOCCUS AUREUS (A)  Final   Report Status 02/10/2018 FINAL  Final   Organism ID, Bacteria STAPHYLOCOCCUS AUREUS (A)  Final      Susceptibility   Staphylococcus aureus - MIC*    CIPROFLOXACIN <=0.5 SENSITIVE Sensitive     GENTAMICIN <=0.5 SENSITIVE Sensitive     NITROFURANTOIN <=16 SENSITIVE Sensitive     OXACILLIN <=0.25 SENSITIVE Sensitive     TETRACYCLINE <=1 SENSITIVE Sensitive     VANCOMYCIN 1 SENSITIVE Sensitive     TRIMETH/SULFA <=10 SENSITIVE Sensitive     CLINDAMYCIN RESISTANT Resistant     RIFAMPIN <=0.5 SENSITIVE Sensitive     Inducible Clindamycin POSITIVE Resistant     * >=100,000 COLONIES/mL STAPHYLOCOCCUS AUREUS  MRSA PCR Screening     Status: None   Collection Time: 02/08/18  8:51 PM  Result Value Ref Range Status   MRSA by PCR NEGATIVE NEGATIVE Final    Comment:        The GeneXpert MRSA Assay (FDA approved for NASAL specimens only), is one component of a comprehensive MRSA colonization surveillance program. It is not intended to diagnose MRSA infection nor to guide or monitor treatment for MRSA infections. Performed at [facility], [address] [place], [place] [address]   Culture, blood (routine x 2)     Status: Abnormal (Preliminary result)   Collection Time: 02/11/18 12:59 PM  Result Value Ref Range Status   Specimen Description   Final    BLOOD LEFT HAND BOTTLES DRAWN AEROBIC AND ANAEROBIC Performed at [facility], [address], [place], [place] [address]    Special Requests   Final  Blood Culture adequate volume Performed  at [facility], [address], [place], [place] [address]    Culture  Setup Time   Final    GRAM POSITIVE COCCI IN CLUSTERS RECOVERED FROM THE ANAEROBIC BOTTLE AND THE AEROBIC BOTTLE Gram Stain Report Called to,Read Back By and Verified With: MOTLEY,J. AT 1048 ON 02/12/2018 BY BAUGHAM,M. Performed at [facility] CRITICAL RESULT CALLED TO, READ BACK BY AND VERIFIED WITH: HURTH PHARMD AT 1402 ON [id number] BY SJW CRITICAL VALUE NOTED.  VALUE IS CONSISTENT WITH PREVIOUSLY REPORTED AND CALLED VALUE.    Culture (A)  Final    STAPHYLOCOCCUS AUREUS SUSCEPTIBILITIES PERFORMED ON PREVIOUS CULTURE WITHIN THE LAST 5 DAYS. Performed at [facility], [address] [place], [place] [address]    Report Status PENDING  Incomplete  Culture, blood (routine x 2)     Status: None (Preliminary result)   Collection Time: 02/12/18 10:28 PM  Result Value Ref Range Status   Specimen Description BLOOD LEFT ARM  Final   Special Requests   Final    BOTTLES DRAWN AEROBIC AND ANAEROBIC Blood Culture adequate volume   Culture   Final    NO GROWTH < 12 HOURS Performed at [facility], [address] [place], [place] [address]    Report Status PENDING  Incomplete  Culture, blood (routine x 2)     Status: None (Preliminary result)   Collection Time: 02/12/18 10:30 PM  Result Value Ref Range Status   Specimen Description BLOOD LEFT HAND  Final   Special Requests   Final    BOTTLES DRAWN AEROBIC ONLY Blood Culture adequate volume   Culture   Final    NO GROWTH < 12 HOURS Performed at [facility], [address] [place], [place] [address]    Report Status PENDING  Incomplete     Radiology Studies: No results found.  Scheduled Meds: . diclofenac  1 patch Transdermal BID  . heparin  5,000 Units Subcutaneous Q8H  . metoprolol tartrate  2.5 mg Intravenous Q8H  . nicotine  14 mg Transdermal Daily  . sodium chloride flush  3 mL Intravenous Q12H   Continuous Infusions: . sodium chloride    .   ceFAZolin (ANCEF) IV Stopped (02/13/18 0659)     LOS: 5 days    Time spent: 35 minutes.  Over 50% of the time was spent in direct contact with the patient, coordinating care and discussing relevant ongoing clinical issues; patient has been informed about still positive blood cultures making impossible to place PICC line at this time; we had discussion about further adjustment on her pain medications to minimize/SCDs with opiate withdrawal syndrome and to make her feel more comfortable.  Patient demonstrated having capacity to understand clinical situation and needs of care.  There was discussion about development of lower extremity pedal edema after fluid resuscitation; TED hoses will be applied (12 hours on 12 hours off) and IV fluid rate will be adjusted.  Patient has expressed understanding for the need of intermittent blood work to assess electrolytes, renal function, WBCs and hemoglobin level; blood work will be order for tomorrow morning.   Vassie Loll, MD [facility] Pager [phone]  02/13/2018, 11:01 AM

## 2018-02-14 DIAGNOSIS — E46 Unspecified protein-calorie malnutrition: Secondary | ICD-10-CM

## 2018-02-14 DIAGNOSIS — F1990 Other psychoactive substance use, unspecified, uncomplicated: Secondary | ICD-10-CM

## 2018-02-14 DIAGNOSIS — D649 Anemia, unspecified: Secondary | ICD-10-CM

## 2018-02-14 DIAGNOSIS — D696 Thrombocytopenia, unspecified: Secondary | ICD-10-CM

## 2018-02-14 LAB — CBC
HCT: 22.2 % — ABNORMAL LOW (ref 36.0–46.0)
Hemoglobin: 6.5 g/dL — CL (ref 12.0–15.0)
MCH: 25.2 pg — ABNORMAL LOW (ref 26.0–34.0)
MCHC: 29.3 g/dL — ABNORMAL LOW (ref 30.0–36.0)
MCV: 86 fL (ref 80.0–100.0)
Platelets: 229 10*3/uL (ref 150–400)
RBC: 2.58 MIL/uL — ABNORMAL LOW (ref 3.87–5.11)
RDW: 18.3 % — ABNORMAL HIGH (ref 11.5–15.5)
WBC: 27.3 10*3/uL — ABNORMAL HIGH (ref 4.0–10.5)
nRBC: 0.2 % (ref 0.0–0.2)

## 2018-02-14 LAB — RETICULOCYTES
Immature Retic Fract: 29 % — ABNORMAL HIGH (ref 2.3–15.9)
RBC.: 2.47 MIL/uL — ABNORMAL LOW (ref 3.87–5.11)
Retic Count, Absolute: 168.5 10*3/uL (ref 19.0–186.0)
Retic Ct Pct: 6.8 % — ABNORMAL HIGH (ref 0.4–3.1)

## 2018-02-14 LAB — IRON AND TIBC
Iron: 21 ug/dL — ABNORMAL LOW (ref 28–170)
Saturation Ratios: 13 % (ref 10.4–31.8)
TIBC: 158 ug/dL — ABNORMAL LOW (ref 250–450)
UIBC: 137 ug/dL

## 2018-02-14 LAB — PREPARE RBC (CROSSMATCH)

## 2018-02-14 LAB — BASIC METABOLIC PANEL
Anion gap: 8 (ref 5–15)
BUN: 13 mg/dL (ref 6–20)
CO2: 22 mmol/L (ref 22–32)
Calcium: 7.6 mg/dL — ABNORMAL LOW (ref 8.9–10.3)
Chloride: 102 mmol/L (ref 98–111)
Creatinine, Ser: 0.74 mg/dL (ref 0.44–1.00)
GFR calc Af Amer: >60 mL/min (ref >60)
GLUCOSE: 109 mg/dL — AB (ref 70–99)
Potassium: 5.7 mmol/L — ABNORMAL HIGH (ref 3.5–5.1)
Sodium: 132 mmol/L — ABNORMAL LOW (ref 135–145)

## 2018-02-14 LAB — FOLATE: Folate: 8 ng/mL (ref >5.9)

## 2018-02-14 LAB — RAPID URINE DRUG SCREEN, HOSP PERFORMED
AMPHETAMINES: NOT DETECTED
Barbiturates: NOT DETECTED
Benzodiazepines: NOT DETECTED
Cocaine: NOT DETECTED
Opiates: POSITIVE — AB
Tetrahydrocannabinol: POSITIVE — AB

## 2018-02-14 LAB — ABO/RH: ABO/RH(D): O POS

## 2018-02-14 LAB — HEPATIC FUNCTION PANEL
ALBUMIN: 1.4 g/dL — AB (ref 3.5–5.0)
ALT: 27 U/L (ref 0–44)
AST: 63 U/L — ABNORMAL HIGH (ref 15–41)
Alkaline Phosphatase: 95 U/L (ref 38–126)
Bilirubin, Direct: 0.2 mg/dL (ref 0.0–0.2)
Indirect Bilirubin: 0.2 mg/dL — ABNORMAL LOW (ref 0.3–0.9)
Total Bilirubin: 0.4 mg/dL (ref 0.3–1.2)
Total Protein: 5.8 g/dL — ABNORMAL LOW (ref 6.5–8.1)

## 2018-02-14 LAB — FERRITIN: Ferritin: 619 ng/mL — ABNORMAL HIGH (ref 11–307)

## 2018-02-14 LAB — CULTURE, BLOOD (ROUTINE X 2): Special Requests: ADEQUATE

## 2018-02-14 LAB — VITAMIN B12: Vitamin B-12: 1127 pg/mL — ABNORMAL HIGH (ref 180–914)

## 2018-02-14 LAB — LACTATE DEHYDROGENASE: LDH: 326 U/L — ABNORMAL HIGH (ref 98–192)

## 2018-02-14 MED ORDER — PENTAFLUOROPROP-TETRAFLUOROETH EX AERO: INHALATION_SPRAY | CUTANEOUS | Status: DC | PRN

## 2018-02-14 MED ORDER — SODIUM CHLORIDE 0.9 % IV BOLUS
1000.0000 mL | Freq: Once | INTRAVENOUS | Status: AC
  Administered 2018-02-14: 1000 mL via INTRAVENOUS

## 2018-02-14 MED ORDER — PANTOPRAZOLE SODIUM 40 MG IV SOLR: 40.0000 mg | INTRAVENOUS | Status: DC

## 2018-02-14 MED ORDER — PANTOPRAZOLE SODIUM 40 MG IV SOLR
40.0000 mg | Freq: Two times a day (BID) | INTRAVENOUS | Status: DC
  Administered 2018-02-14 (×2): 40 mg via INTRAVENOUS
  Filled 2018-02-14 (×2): qty 40

## 2018-02-14 MED ORDER — SODIUM CHLORIDE 0.9% IV SOLUTION: Freq: Once | INTRAVENOUS | Status: DC

## 2018-02-14 NOTE — Progress Notes (Signed)
Off going nurse expressed concern that patient had a blue purse in her bed that had not been seen previously. Off going nurse asked if I would remove the purse due to the patients admitted history of illegal drug use. Removed purse from the patient's room and Chinita GreenlandJames Daniels RN searched the bag. Upon searching the bag, a crack pipe, syringe and other paraphernalia were discovered. AC and security were called. Dr. Sharl MaLama was made aware who ordered at rapid urine drug screen. All items were placed in a biohazard bag and [place] [employer] was called. AC, myself, security and [employer] went in to talk with the patient. Before we could begin speaking with her, she immediately asked if she was going to jail. AC advised her that she was not, but for her own safety, we would be searching her room. She then admitted to the paraphernalia but followed up with it not being hers. No other items were discovered in her room. A clean pure wick and canister was put in place in an attempt to collect a urine sample.   Pt wants all times written on board in her room for next available pain medications. She refuses scheduled medications if her pain pills can't be given when she wants them. Advised patient that I would not be giving her the pain medicine at scheduled intervals that she would have to ask for the medications.

## 2018-02-14 NOTE — Progress Notes (Signed)
Received new orders to transfuse one unit PRBC. Will discuss with patient and obtain attempt consent.

## 2018-02-14 NOTE — Progress Notes (Signed)
Discussed with patient her current hemoglobin and the need for a blood transfusion. Explained to the patient that the lab would need to draw more blood to type and screen her before she could be transfused. She refused to sign the consent stating that "she was not in her right mind to sign anything right now." Asked to have it signed in the morning.

## 2018-02-14 NOTE — Progress Notes (Signed)
Patient ID: Amy BattlesShan Stavros, female   DOB: 07/18/1995, 23 y.o.   MRN: [id number]         [facility]    Date of Admission:  02/08/2018   Day 6 cefazolin         Ms. 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  Repeat blood cultures remain negative at 36 hours  I will hold off on PICC placement for now.  Drug paraphernalia was found in her room last night.  She remains febrile, tachycardic and tachypneic but not hypoxic. She will need at least 6 weeks of IV cefazolin therapy in a supervised setting.      Cliffton AstersJohn Piers Baade, MD [facility] [phone] pager   [phone] cell 02/14/2018, 9:48 AM

## 2018-02-14 NOTE — Progress Notes (Signed)
Pt refused to have labs drawn for type and screen. Told lab to come back in the morning.

## 2018-02-14 NOTE — Consult Note (Signed)
Reason for Consult: anemia Referring Physician: Hospitalist  Amy Mcknight is an 23 y.o. female.  HPI: Patient refuses to provide hx. Very angry in room. (Hx from records)Hx significant for substance abuse. Present to the ED with rt lower back pain.  Symptoms started acutely.  She also had chills, SOB, cough with brown sputum, and rt sided chest wall pain. She uses heroin. Admitted with Cavitary pneumonia. Admission hemoglobin 10.9. today her hemoglobin 6.5. I attempted a rectal and she began to cuss. I did however get some stool on my glove and it was guaiac negative.  She is refusing blood transfusion this am.  Iron studies are pending.  Past Medical History:  Diagnosis Date  . Substance use disorder     History reviewed. No pertinent surgical history.  Family History  Problem Relation Age of Onset  . Diabetes Mother     Social History:  reports that she has been smoking cigarettes. She has been smoking about 0.50 packs per day. She has never used smokeless tobacco. She reports previous alcohol use. She reports current drug use. Drug: Heroin.  Allergies:  Allergies  Allergen Reactions  . Penicillins Other (See Comments)    Diaper rash as an infant per mother    Medications: I have reviewed the patient's current medications.  Results for orders placed or performed during the hospital encounter of 02/08/18 (from the past 48 hour(s))  Culture, blood (routine x 2)     Status: None (Preliminary result)   Collection Time: 02/12/18 10:28 PM  Result Value Ref Range   Specimen Description BLOOD LEFT ARM    Special Requests      BOTTLES DRAWN AEROBIC AND ANAEROBIC Blood Culture adequate volume   Culture      NO GROWTH < 12 HOURS Performed at [facility], [address] [place], [place] [address]    Report Status PENDING   Culture, blood (routine x 2)     Status: None (Preliminary result)   Collection Time: 02/12/18 10:30 PM  Result Value Ref Range   Specimen Description BLOOD LEFT  HAND    Special Requests      BOTTLES DRAWN AEROBIC ONLY Blood Culture adequate volume   Culture      NO GROWTH < 12 HOURS Performed at [facility], [address], [place], [place] [address]    Report Status PENDING   Urine rapid drug screen (hosp performed)     Status: Abnormal   Collection Time: 02/13/18  8:58 PM  Result Value Ref Range   Opiates POSITIVE (A) NONE DETECTED   Cocaine NONE DETECTED NONE DETECTED   Benzodiazepines NONE DETECTED NONE DETECTED   Amphetamines NONE DETECTED NONE DETECTED   Tetrahydrocannabinol POSITIVE (A) NONE DETECTED   Barbiturates NONE DETECTED NONE DETECTED    Comment: (NOTE) DRUG SCREEN FOR MEDICAL PURPOSES ONLY.  IF CONFIRMATION IS NEEDED FOR ANY PURPOSE, NOTIFY LAB WITHIN 5 DAYS. LOWEST DETECTABLE LIMITS FOR URINE DRUG SCREEN Drug Class                     Cutoff (ng/mL) Amphetamine and metabolites    1000 Barbiturate and metabolites    200 Benzodiazepine                 200 Tricyclics and metabolites     300 Opiates and metabolites        300 Cocaine and metabolites        300 THC  50 Performed at [facility], [address] [place], [place] [address]   CBC     Status: Abnormal   Collection Time: 02/14/18  4:20 AM  Result Value Ref Range   WBC 27.3 (H) 4.0 - 10.5 K/uL   RBC 2.58 (L) 3.87 - 5.11 MIL/uL   Hemoglobin 6.5 (LL) 12.0 - 15.0 g/dL    Comment: REPEATED TO VERIFY THIS CRITICAL RESULT HAS VERIFIED AND BEEN CALLED TO A WADE,RN BY MARIE KELLY ON 02 12 2020 AT 0500, AND HAS BEEN READ BACK.  THIS CRITICAL RESULT HAS VERIFIED AND BEEN CALLED TO A WADE,RN BY MARIE KELLY ON 02 12 2020 AT 0501, AND HAS BEEN READ BACK.     HCT 22.2 (L) 36.0 - 46.0 %   MCV 86.0 80.0 - 100.0 fL   MCH 25.2 (L) 26.0 - 34.0 pg   MCHC 29.3 (L) 30.0 - 36.0 g/dL   RDW 51.7 (H) 61.6 - 07.3 %   Platelets 229 150 - 400 K/uL   nRBC 0.2 0.0 - 0.2 %    Comment: Performed at [facility], [address], [place], [place] [address]   Basic metabolic panel     Status: Abnormal   Collection Time: 02/14/18  4:20 AM  Result Value Ref Range   Sodium 132 (L) 135 - 145 mmol/L   Potassium 5.7 (H) 3.5 - 5.1 mmol/L   Chloride 102 98 - 111 mmol/L   CO2 22 22 - 32 mmol/L   Glucose, Bld 109 (H) 70 - 99 mg/dL   BUN 13 6 - 20 mg/dL   Creatinine, Ser 6.94 0.44 - 1.00 mg/dL   Calcium 7.6 (L) 8.9 - 10.3 mg/dL   GFR calc non Af Amer >60 >60 mL/min   GFR calc Af Amer >60 >60 mL/min   Anion gap 8 5 - 15    Comment: Performed at [facility], [address] [place], [place] [address]    No results found.  ROS Blood pressure 124/77, pulse (!) 55, temperature 99.2 F (37.3 C), temperature source Axillary, resp. rate (!) 50, height 5\' 3"  (1.6 m), weight 61.1 kg, SpO2 94 %. Physical Exam Stool brown and guaiac negative. Patient refuses physical exam. Assessment/Plan: Anemia. Drop in hemoglobin from 10.9 to 6.5. She was guaiac negative with me today. Dr. Karilyn Cota is aware.   Terri L Setzer 02/14/2018, 8:41 AM

## 2018-02-14 NOTE — Progress Notes (Addendum)
PROGRESS NOTE    Amy Mcknight  EAV:[id number] DOB: 1995-01-28 DOA: 02/08/2018 PCP: Patient, No Pcp Per   Brief Narrative:  23 y.o.femalewith medical history significant forsubstance use disorderwho presents to the ED with 2 days of right lower back pain. Patient states symptoms began with acute onset while at rest. Symptoms have been persistent. She has noted associated diaphoresis, chills, shortness of breath, cough productive of brown sputum, and right chest wall pain. She denies any rashes or obvious skin changes. She reports good urine output without dysuria. She denies any abdominal pain, diarrhea, or constipation.  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 She continues to use recreational drugs by snorting nasally. She denies any alcohol use. She reports a history of withdrawal from opiates in the past.  And is noted to be positive for marijuana and opiates on her UDS.  She is noted to be anemic this morning for which 2 units of PRBCs have been ordered for transfusion.  No overt bleeding currently identified, but patient is complaining of some epigastric abdominal pain.  As result of this pain, she has not been eating very much.  She denies any nausea or vomiting.  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 -Pneumonia lesions could also represent septic emboli -Case discussed with infectious disease doctor who has recommended continue supportive care and continue IV Ancef -Once repeat blood cultures demonstrated  no growth for 48 hours PICC line can be place. So far cx's remain without growth for less than 12 hours. -At this moment plan is for 6 weeks of supervised IV treatment. -2D echo positive for tricuspid vegetation and moderate regurgitation.  2-AKI (acute kidney injury) (HCC) -In the setting of sepsis/prerenal azotemia -Will minimize the use of nephrotoxic agents -Continue IV fluids -Follow creatinine trend intermittently. -Creatinine down to normal range at this moment.  3-Substance use disorder/opiate withdrawal syndrome -Extensive cessation counseling has been provided.   -Patient was receptive, but has not really expressed future decisions about quitting recreational drugs.. -Continue adjusted dose of narcotics. -Patient expressing pain all over especially in her back.  4-tobacco abuse -Extensive cessation counseling provided. -nicotine patch offered.   5-Hyponatremia-improving -in the setting of dehydration -will continue IVF's at adjusted rate  6-positive hepatitis C antibody -Quantitative viral load test undetectable; suggesting that the patient was able to clear infection on her own -No treatment needed  -Patient educated about how these diseases transmitted and counseling provided regarding safe sex practices and to stop the use of IV drugs.  7-bilateral pedal edema -Apply TED hoses -Adjust IV fluid rates.  8-severe anemia-suspect blood loss -Anemia panel as well as stool occult has been ordered -2 unit PRBC ordered for transfusion -Given rapid blood loss on lab work as well as epigastric symptoms, will consult GI for possible endoscopy -PPI IV twice daily -Clear liquid diet -We will give 1 L normal saline bolus now for noted tachycardia.  Continue on metoprolol IV as scheduled.  Blood pressures are stable.  DVT prophylaxis: Heparin changed to SCDs Code Status:  Full code Family Communication: No family at bedside. Disposition Plan: Remains inpatient,  continue IV Ancef.  Follow repeated blood cultures and order PICC line once cultures negative for 48 hours.    2 units PRBCs ordered for transfusion this morning as well as GI consultation for possible need for endoscopy given symptoms and significant blood loss.  Continue supportive care and as needed analgesics.    Consultants:   Infectious disease over the phone (Dr. Orvan Falconer).  Procedures:   2D echo:  Fraction (55-60%), and no microscopic valve with moderate regurgitation and large vegetation.  No wall motion normalities appreciated.  Antimicrobials:   Cefazolin 2/7->  Vancomycin 2/7-2/7   Subjective: Patient seen and evaluated today with complaints of epigastric abdominal pain with decreased appetite, but no nausea or vomiting.  She states that she had a bowel movement this morning which was noted to be nonbloody and not dark.  No acute concerns or events noted overnight.  She continues to have ongoing pain issues and some tachycardia.  She was noted to be profoundly anemic this morning for which 2 units PRBCs have been ordered.  Objective: Vitals:   02/14/18 0200 02/14/18 0300 02/14/18 0400 02/14/18 0500  BP: 128/79 120/77 116/71   Pulse:  (!) 55    Resp: (!) 58 (!) 48 (!) 46   Temp:   100.3 F (37.9 C)   TempSrc:      SpO2:  94%    Weight:    61.1 kg  Height:        Intake/Output Summary (Last 24 hours) at 02/14/2018 0737 Last data filed at 02/13/2018 1516 Gross per 24 hour  Intake 504.61 ml  Output -  Net 504.61 ml   Filed Weights   02/12/18 0500 02/13/18 0400 02/14/18 0500  Weight: 63.8 kg 61.1 kg 61.1 kg    Examination:  General exam: Appears calm and comfortable  Respiratory system: Clear to auscultation. Respiratory effort normal. Cardiovascular system: S1 & S2 heard, RRR. No JVD, murmurs, rubs, gallops or clicks. No pedal edema.  She is currently tachycardic. Gastrointestinal system: Abdomen is nondistended, soft and nontender. No organomegaly or  masses felt. Normal bowel sounds heard. Central nervous system: Alert and oriented. No focal neurological deficits. Extremities: Symmetric 5 x 5 power. Skin: No rashes, lesions or ulcers Psychiatry: Judgement and insight appear normal. Mood & affect appropriate.     Data Reviewed: I have personally reviewed following labs and imaging studies  CBC: Recent Labs  Lab 02/08/18 1306 02/09/18 0640 02/11/18 1248 02/14/18 0420  WBC 26.6* 26.5* 23.8* 27.3*  NEUTROABS 23.2*  --   --   --   HGB 10.2* 9.0* 8.1* 6.5*  HCT 32.8* 28.9* 27.4* 22.2*  MCV 80.2 81.6 83.5 86.0  PLT 145* 136* 115* 229   Basic Metabolic Panel: Recent Labs  Lab 02/08/18 1306 02/09/18 0640 02/11/18 1248 02/14/18 0420  NA 127* 131* 131* 132*  K 3.7 3.6 4.8 5.7*  CL 88* 99 102 102  CO2 24 20* 20* 22  GLUCOSE 117* 139* 100* 109*  BUN 41* 30* 11 13  CREATININE 2.41* 1.12* 0.62 0.74  CALCIUM 7.9* 7.0* 7.3* 7.6*   GFR: Estimated Creatinine Clearance: 90.5 mL/min (by C-G formula based on SCr of 0.74 mg/dL). Liver Function Tests: Recent Labs  Lab 02/08/18 1306  AST 33  ALT 25  ALKPHOS 137*  BILITOT 0.9  PROT 7.5  ALBUMIN 2.3*   No results for input(s): LIPASE, AMYLASE in the last 168 hours. No results  for input(s): AMMONIA in the last 168 hours. Coagulation Profile: No results for input(s): INR, PROTIME in the last 168 hours. Cardiac Enzymes: No results for input(s): CKTOTAL, CKMB, CKMBINDEX, TROPONINI in the last 168 hours. BNP (last 3 results) No results for input(s): PROBNP in the last 8760 hours. HbA1C: No results for input(s): HGBA1C in the last 72 hours. CBG: No results for input(s): GLUCAP in the last 168 hours. Lipid Profile: No results for input(s): CHOL, HDL, LDLCALC, TRIG, CHOLHDL, LDLDIRECT in the last 72 hours. Thyroid Function Tests: No results for input(s): TSH, T4TOTAL, FREET4, T3FREE, THYROIDAB in the last 72 hours. Anemia Panel: No results for input(s): VITAMINB12, FOLATE,  FERRITIN, TIBC, IRON, RETICCTPCT in the last 72 hours. Sepsis Labs: Recent Labs  Lab 02/08/18 1306 02/08/18 1541 02/08/18 1953  LATICACIDVEN 2.1* 2.9* 2.8*    Recent Results (from the past 240 hour(s))  Blood Culture (routine x 2)     Status: Abnormal   Collection Time: 02/08/18  1:06 PM  Result Value Ref Range Status   Specimen Description   Final    BLOOD LEFT WRIST Performed at [facility], [address], [place], [place] [address]    Special Requests   Final    BOTTLES DRAWN AEROBIC ONLY Blood Culture results may not be optimal due to an inadequate volume of blood received in culture bottles Performed at [facility], [address], [place], [place] [address]    Culture  Setup Time   Final    AEROBIC BOTTLE ONLY GRAM POSITIVE COCCI Gram Stain Report Called to,Read Back By and Verified With: J DANIELS,RN @0228  02/09/18 MKELLY CRITICAL RESULT CALLED TO, READ BACK BY AND VERIFIED WITH: PHARMD L POOLE [id number] MLM Performed at [facility], [address], [place], [place] [address]    Culture STAPHYLOCOCCUS AUREUS (A)  Final   Report Status 02/11/2018 FINAL  Final   Organism ID, Bacteria STAPHYLOCOCCUS AUREUS  Final      Susceptibility   Staphylococcus aureus - MIC*    CIPROFLOXACIN <=0.5 SENSITIVE Sensitive     ERYTHROMYCIN >=8 RESISTANT Resistant     GENTAMICIN <=0.5 SENSITIVE Sensitive     OXACILLIN <=0.25 SENSITIVE Sensitive     TETRACYCLINE <=1 SENSITIVE Sensitive     VANCOMYCIN <=0.5 SENSITIVE Sensitive     TRIMETH/SULFA <=10 SENSITIVE Sensitive     CLINDAMYCIN RESISTANT Resistant     RIFAMPIN <=0.5 SENSITIVE Sensitive     Inducible Clindamycin POSITIVE Resistant     * STAPHYLOCOCCUS AUREUS  Blood Culture ID Panel (Reflexed)     Status: Abnormal   Collection Time: 02/08/18  1:06 PM  Result Value Ref Range Status   Enterococcus species NOT DETECTED NOT DETECTED Final   Listeria monocytogenes NOT DETECTED NOT DETECTED Final   Staphylococcus species  DETECTED (A) NOT DETECTED Final    Comment: CRITICAL RESULT CALLED TO, READ BACK BY AND VERIFIED WITH: PHARMD L POOLE [phone] MLM    Staphylococcus aureus (BCID) DETECTED (A) NOT DETECTED Final    Comment: Methicillin (oxacillin) susceptible Staphylococcus aureus (MSSA). Preferred therapy is anti staphylococcal beta lactam antibiotic (Cefazolin or Nafcillin), unless clinically contraindicated. CRITICAL RESULT CALLED TO, READ BACK BY AND VERIFIED WITH: PHARMD L POOLE [id number] MLM    Methicillin resistance NOT DETECTED NOT DETECTED Final   Streptococcus species NOT DETECTED NOT DETECTED Final   Streptococcus agalactiae NOT DETECTED NOT DETECTED Final   Streptococcus pneumoniae NOT DETECTED NOT DETECTED Final   Streptococcus pyogenes NOT DETECTED NOT DETECTED  Final   Acinetobacter baumannii NOT DETECTED NOT DETECTED Final   Enterobacteriaceae species NOT DETECTED NOT DETECTED Final   Enterobacter cloacae complex NOT DETECTED NOT DETECTED Final   Escherichia coli NOT DETECTED NOT DETECTED Final   Klebsiella oxytoca NOT DETECTED NOT DETECTED Final   Klebsiella pneumoniae NOT DETECTED NOT DETECTED Final   Proteus species NOT DETECTED NOT DETECTED Final   Serratia marcescens NOT DETECTED NOT DETECTED Final   Haemophilus influenzae NOT DETECTED NOT DETECTED Final   Neisseria meningitidis NOT DETECTED NOT DETECTED Final   Pseudomonas aeruginosa NOT DETECTED NOT DETECTED Final   Candida albicans NOT DETECTED NOT DETECTED Final   Candida glabrata NOT DETECTED NOT DETECTED Final   Candida krusei NOT DETECTED NOT DETECTED Final   Candida parapsilosis NOT DETECTED NOT DETECTED Final   Candida tropicalis NOT DETECTED NOT DETECTED Final    Comment: Performed at [facility], [address] [place], [place] [address]  Blood Culture (routine x 2)     Status: Abnormal   Collection Time: 02/08/18  1:13 PM  Result Value Ref Range Status   Specimen Description   Final    BLOOD LEFT  ARM Performed at [facility], [address] [place], [place] [address]    Special Requests   Final    BOTTLES DRAWN AEROBIC AND ANAEROBIC Blood Culture adequate volume Performed at [facility], [address] [place], [place] [address]    Culture  Setup Time   Final    IN BOTH AEROBIC AND ANAEROBIC BOTTLES GRAM POSITIVE COCCI Gram Stain Report Called to,Read Back By and Verified With: J DANIELS,RN @0230  02/09/18 [facility] Performed at [facility], [address] [place], [place] [address]    Culture (A)  Final    STAPHYLOCOCCUS AUREUS SUSCEPTIBILITIES PERFORMED ON PREVIOUS CULTURE WITHIN THE LAST 5 DAYS. Performed at [facility], [address] [place], [place] [address]    Report Status 02/11/2018 FINAL  Final  Urine culture     Status: Abnormal   Collection Time: 02/08/18  1:19 PM  Result Value Ref Range Status   Specimen Description URINE, CLEAN CATCH  Final   Special Requests   Final    NONE Performed at [facility], [address] [place], [place] [address]    Culture >=100,000 COLONIES/mL STAPHYLOCOCCUS AUREUS (A)  Final   Report Status 02/10/2018 FINAL  Final   Organism ID, Bacteria STAPHYLOCOCCUS AUREUS (A)  Final      Susceptibility   Staphylococcus aureus - MIC*    CIPROFLOXACIN <=0.5 SENSITIVE Sensitive     GENTAMICIN <=0.5 SENSITIVE Sensitive     NITROFURANTOIN <=16 SENSITIVE Sensitive     OXACILLIN <=0.25 SENSITIVE Sensitive     TETRACYCLINE <=1 SENSITIVE Sensitive     VANCOMYCIN 1 SENSITIVE Sensitive     TRIMETH/SULFA <=10 SENSITIVE Sensitive     CLINDAMYCIN RESISTANT Resistant     RIFAMPIN <=0.5 SENSITIVE Sensitive     Inducible Clindamycin POSITIVE Resistant     * >=100,000 COLONIES/mL STAPHYLOCOCCUS AUREUS  MRSA PCR Screening     Status: None   Collection Time: 02/08/18  8:51 PM  Result Value Ref Range Status   MRSA by PCR NEGATIVE NEGATIVE Final    Comment:        The GeneXpert MRSA Assay (FDA approved for NASAL specimens only), is one  component of a comprehensive MRSA colonization surveillance program. It is not intended to diagnose MRSA infection nor to guide or monitor treatment for MRSA infections.  Performed at [facility], [address] [place], [place] [address]   Culture, blood (routine x 2)     Status: Abnormal   Collection Time: 02/11/18 12:59 PM  Result Value Ref Range Status   Specimen Description   Final    BLOOD LEFT HAND BOTTLES DRAWN AEROBIC AND ANAEROBIC Performed at [facility], [address] [place], [place] [address]    Special Requests   Final    Blood Culture adequate volume Performed at [facility], [address] [place], [place] [address]    Culture  Setup Time   Final    GRAM POSITIVE COCCI IN CLUSTERS RECOVERED FROM THE ANAEROBIC BOTTLE AND THE AEROBIC BOTTLE Gram Stain Report Called to,Read Back By and Verified With: MOTLEY,J. AT 1048 ON 02/12/2018 BY BAUGHAM,M. Performed at [facility] CRITICAL RESULT CALLED TO, READ BACK BY AND VERIFIED WITH: HURTH PHARMD AT 1402 ON [id number] BY SJW CRITICAL VALUE NOTED.  VALUE IS CONSISTENT WITH PREVIOUSLY REPORTED AND CALLED VALUE.    Culture (A)  Final    STAPHYLOCOCCUS AUREUS SUSCEPTIBILITIES PERFORMED ON PREVIOUS CULTURE WITHIN THE LAST 5 DAYS. Performed at [facility], [address] [place], [place] [address]    Report Status 02/14/2018 FINAL  Final  Culture, blood (routine x 2)     Status: None (Preliminary result)   Collection Time: 02/12/18 10:28 PM  Result Value Ref Range Status   Specimen Description BLOOD LEFT ARM  Final   Special Requests   Final    BOTTLES DRAWN AEROBIC AND ANAEROBIC Blood Culture adequate volume   Culture   Final    NO GROWTH < 12 HOURS Performed at [facility], [address] [place], [place] [address]    Report Status PENDING  Incomplete  Culture, blood (routine x 2)     Status: None (Preliminary result)   Collection Time: 02/12/18 10:30 PM  Result Value Ref Range Status   Specimen  Description BLOOD LEFT HAND  Final   Special Requests   Final    BOTTLES DRAWN AEROBIC ONLY Blood Culture adequate volume   Culture   Final    NO GROWTH < 12 HOURS Performed at [facility], [address] [place], [place] [address]    Report Status PENDING  Incomplete         Radiology Studies: No results found.      Scheduled Meds: . sodium chloride   Intravenous Once  . diclofenac  1 patch Transdermal BID  . metoprolol tartrate  2.5 mg Intravenous Q8H  . nicotine  14 mg Transdermal Daily  . pantoprazole (PROTONIX) IV  40 mg Intravenous Q24H  . sodium chloride flush  3 mL Intravenous Q12H   Continuous Infusions: . sodium chloride 50 mL/hr at 02/13/18 1516  .  ceFAZolin (ANCEF) IV 2 g (02/14/18 0523)     LOS: 6 days    Time spent: 30 minutes    Kieran Nachtigal Hoover Brunette Devereaux Grayson, DO [facility] Pager [phone]  If 7PM-7AM, please contact night-coverage [email or web address] Password [email or web address] 02/14/2018, 7:37 AM

## 2018-02-14 NOTE — Progress Notes (Signed)
CRITICAL VALUE ALERT  Critical Value:  Hgb 6.5  Date & Time Notied:  02/14/2018 0503  Provider Notified: [place]  Orders Received/Actions taken: pending; no orders at this time

## 2018-02-15 LAB — COMPREHENSIVE METABOLIC PANEL
ALT: 30 U/L (ref 0–44)
AST: 102 U/L — ABNORMAL HIGH (ref 15–41)
Albumin: 1.4 g/dL — ABNORMAL LOW (ref 3.5–5.0)
Alkaline Phosphatase: 101 U/L (ref 38–126)
Anion gap: 9 (ref 5–15)
BILIRUBIN TOTAL: 0.6 mg/dL (ref 0.3–1.2)
BUN: 13 mg/dL (ref 6–20)
CO2: 17 mmol/L — ABNORMAL LOW (ref 22–32)
Calcium: 7.4 mg/dL — ABNORMAL LOW (ref 8.9–10.3)
Chloride: 104 mmol/L (ref 98–111)
Creatinine, Ser: 0.59 mg/dL (ref 0.44–1.00)
GFR calc Af Amer: >60 mL/min (ref >60)
GFR calc non Af Amer: >60 mL/min (ref >60)
Glucose, Bld: 110 mg/dL — ABNORMAL HIGH (ref 70–99)
POTASSIUM: 5.2 mmol/L — AB (ref 3.5–5.1)
Sodium: 130 mmol/L — ABNORMAL LOW (ref 135–145)
TOTAL PROTEIN: 5.5 g/dL — AB (ref 6.5–8.1)

## 2018-02-15 LAB — TYPE AND SCREEN
ABO/RH(D): O POS
Antibody Screen: NEGATIVE
Unit division: 0
Unit division: 0

## 2018-02-15 LAB — CBC
HCT: 32.7 % — ABNORMAL LOW (ref 36.0–46.0)
Hemoglobin: 9.9 g/dL — ABNORMAL LOW (ref 12.0–15.0)
MCH: 25.4 pg — ABNORMAL LOW (ref 26.0–34.0)
MCHC: 30.3 g/dL (ref 30.0–36.0)
MCV: 84.1 fL (ref 80.0–100.0)
Platelets: 123 10*3/uL — ABNORMAL LOW (ref 150–400)
RBC: 3.89 MIL/uL (ref 3.87–5.11)
RDW: 19.7 % — ABNORMAL HIGH (ref 11.5–15.5)
WBC: 30.3 10*3/uL — ABNORMAL HIGH (ref 4.0–10.5)
nRBC: 0.1 % (ref 0.0–0.2)

## 2018-02-15 LAB — BPAM RBC
Blood Product Expiration Date: 202003092359
Blood Product Expiration Date: 202003152359
ISSUE DATE / TIME: 202002121531
ISSUE DATE / TIME: 202002122017
Unit Type and Rh: 5100
Unit Type and Rh: 5100

## 2018-02-15 LAB — SAVE SMEAR(SSMR), FOR PROVIDER SLIDE REVIEW

## 2018-02-15 LAB — HAPTOGLOBIN: Haptoglobin: 367 mg/dL — ABNORMAL HIGH (ref 33–278)

## 2018-02-15 MED ORDER — PANTOPRAZOLE SODIUM 40 MG PO TBEC
40.0000 mg | DELAYED_RELEASE_TABLET | Freq: Two times a day (BID) | ORAL | Status: DC
  Administered 2018-02-15 – 2018-03-26 (×75): 40 mg via ORAL
  Filled 2018-02-15 (×81): qty 1

## 2018-02-15 MED ORDER — SODIUM CHLORIDE 0.9 % IV SOLN
510.0000 mg | Freq: Once | INTRAVENOUS | Status: AC
  Administered 2018-02-15: 510 mg via INTRAVENOUS
  Filled 2018-02-15: qty 510

## 2018-02-15 MED ORDER — SODIUM CHLORIDE 0.9 % IV SOLN
INTRAVENOUS | Status: DC
  Administered 2018-02-15 – 2018-02-16 (×3): via INTRAVENOUS

## 2018-02-15 MED ORDER — ONDANSETRON HCL 4 MG/2ML IJ SOLN: 4.0000 mg | Freq: Four times a day (QID) | INTRAMUSCULAR | Status: DC | PRN

## 2018-02-15 MED ORDER — DIPHENHYDRAMINE HCL 12.5 MG/5ML PO ELIX: 12.5000 mg | ORAL_SOLUTION | Freq: Four times a day (QID) | ORAL | Status: DC | PRN

## 2018-02-15 MED ORDER — SODIUM CHLORIDE 0.9% FLUSH: 9.0000 mL | INTRAVENOUS | Status: DC | PRN

## 2018-02-15 MED ORDER — HYDROMORPHONE 1 MG/ML IV SOLN
INTRAVENOUS | Status: DC
  Administered 2018-02-15: 1 mg via INTRAVENOUS
  Filled 2018-02-15: qty 25

## 2018-02-15 MED ORDER — NALOXONE HCL 0.4 MG/ML IJ SOLN: 0.4000 mg | INTRAMUSCULAR | Status: DC | PRN

## 2018-02-15 MED ORDER — DIPHENHYDRAMINE HCL 50 MG/ML IJ SOLN: 12.5000 mg | Freq: Four times a day (QID) | INTRAMUSCULAR | Status: DC | PRN

## 2018-02-15 NOTE — Progress Notes (Signed)
Patient stated her dilaudid PCA pump is not helping. Patient stated she would like a diluadid IV push. Mid level made aware. Will continue to monitor throughout shift.

## 2018-02-15 NOTE — Progress Notes (Signed)
Patient ID: Amy Mcknight, female   DOB: 1995/04/03, 23 y.o.   MRN: [id number]         [facility]    Date of Admission:  02/08/2018   Day 7 cefazolin         [name] has MSSA bacteremia complicated by tricuspid valve endocarditis, cavitary pneumonia and lumbar paravertebral myositis.  Repeat blood cultures remain negative at 72 hours. It is okay to go ahead with PICC placement now. She will need at least 6 weeks of IV cefazolin therapy in a supervised setting through 03/22/2018.  I will sign off now.     Cliffton Asters, MD [facility] [phone] pager   [phone] cell 02/15/2018, 9:34 AM

## 2018-02-15 NOTE — Progress Notes (Signed)
PROGRESS NOTE    Amy Mcknight  ZOX:[id number] DOB: [birth year] DOA: 02/08/2018 PCP: Patient, No Pcp Per   Brief Narrative:  23 y.o.femalewith medical history significant forsubstance use disorderwho presents to the ED with 2 days of right lower back pain. Patient states symptoms began with acute onset while at rest. Symptoms have been persistent. She has noted associated diaphoresis, chills, shortness of breath, cough productive of brown sputum, and right chest wall pain. She denies any rashes or obvious skin changes. She reports good urine output without dysuria. She denies any abdominal pain, diarrhea, or constipation.  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 She continues to use recreational drugs by snorting nasally. She denies any alcohol use. She reports a history of withdrawal from opiates in the past.  And is noted to be positive for marijuana and opiates on her UDS.  She is noted to be anemic and is status post 2 unit PRBC transfusion with improvement noted.  No overt bleeding currently identified, but patient is complaining of some epigastric abdominal pain.  As result of this pain, she has not been eating very much.  She denies any nausea or vomiting.  GI has evaluated patient with no need for endoscopy noted at this time.  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 -Pneumonia lesions could also represent septic emboli -Case discussed with infectious disease doctor who has recommended continue supportive care  and continue IV Ancef -Continue to follow culture results and maintain on peripheral IV for now with supervised treatment. -At this moment plan is for 6 weeks of supervised IV treatment. -2D echo positive for tricuspid vegetation and moderate regurgitation.  2-AKI (acute kidney injury) (HCC)-resolved -Continue close monitoring and maintain on IV fluid  3-Substance use disorder/opiate withdrawal syndrome -Extensive cessation counseling has been provided.  -Patient was receptive, but has not really expressed future decisions about quitting recreational drugs.. -Continue adjusted dose of narcotics. -Patient expressing pain all over especially in her back.  4-tobacco abuse -Extensive cessation counseling provided. -nicotine patch offered.   5-Hyponatremia -will continue IVF's with normal saline at adjusted rate  6-positive hepatitis C antibody -Quantitative viral load test undetectable; suggesting that the patient was able to clear infection on her own -No treatment needed  -Patient educated about how these diseases transmitted and counseling provided regarding safe sex practices and to stop the use of IV drugs.  7-bilateral pedal edema -Apply TED hoses -Adjust IV fluid rates.  8-severe anemia-suspect hemolysis in the setting of acute infection -Anemia panel revealed some iron deficiency and stool occult is negative -We will check peripheral smear with haptoglobin pending.  LDH is elevated. -2 unit PRBC ordered on 2/12 with stable hemoglobin levels -Appreciate GI evaluation with no recommendations for endoscopy at this time -PPI IV twice daily changed to p.o. -Feraheme to be given today for some iron deficiency -Clear liquid diet to be advanced to full liquid today. - Continue on metoprolol IV as scheduled.  Blood pressures are stable.  Okay for transfer to telemetry.  DVT prophylaxis:Heparin  changed to SCDs Code Status:Full code Family Communication:No family at  bedside. Disposition Plan:Remains inpatient, continue IV Ancef. Follow repeated blood cultures and maintain on peripheral IV due to drug paraphernalia that was found in the room.  Administer iron supplementation and continue evaluation for hemolysis and monitor CBC.  Appreciate ID and GI involvement.  Okay for transfer to telemetry today with continued tele-sitter at bedside.  Consultants:  Infectious disease over the phone (Dr. Orvan Falconer).  GI Dr. Karilyn Cota  Procedures:  2D echo:  Fraction (55-60%), and no microscopic valve with moderate regurgitation and large vegetation. No wall motion normalities appreciated.  Antimicrobials:   Cefazolin 2/7->  Vancomycin 2/7-2/7  Subjective: Patient seen and evaluated today with ongoing complaints of pain to her back that she claims a severe nature.  Pain medications only provide approximately 30 minutes of relief.  She denies any epigastric pain, nausea, or vomiting.  She is agreeable to advancement of her diet this morning.  Objective: Vitals:   02/15/18 0300 02/15/18 0400 02/15/18 0500 02/15/18 0600  BP: 111/79 118/88 117/88 112/76  Pulse: (!) 122 (!) 120 (!) 118 (!) 120  Resp: (!) 39 (!) 36 (!) 36 (!) 43  Temp:  99.5 F (37.5 C)    TempSrc:  Oral    SpO2: 96% 95% 98% 97%  Weight:   61.4 kg   Height:        Intake/Output Summary (Last 24 hours) at 02/15/2018 0749 Last data filed at 02/15/2018 6045 Gross per 24 hour  Intake 1483 ml  Output -  Net 1483 ml   Filed Weights   02/13/18 0400 02/14/18 0500 02/15/18 0500  Weight: 61.1 kg 61.1 kg 61.4 kg    Examination:  General exam: Appears calm and comfortable  Respiratory system: Clear to auscultation. Respiratory effort normal.  Tachycardic. Cardiovascular system: S1 & S2 heard, RRR. No JVD, murmurs, rubs, gallops or clicks. No pedal edema. Gastrointestinal system: Abdomen is nondistended, soft and nontender. No organomegaly or masses felt. Normal bowel sounds  heard. Central nervous system: Alert and oriented. No focal neurological deficits. Extremities: Symmetric 5 x 5 power. Skin: No rashes, lesions or ulcers Psychiatry: Cannot be evaluated.    Data Reviewed: I have personally reviewed following labs and imaging studies  CBC: Recent Labs  Lab 02/08/18 1306 02/09/18 0640 02/11/18 1248 02/14/18 0420 02/15/18 0314  WBC 26.6* 26.5* 23.8* 27.3* 30.3*  NEUTROABS 23.2*  --   --   --   --   HGB 10.2* 9.0* 8.1* 6.5* 9.9*  HCT 32.8* 28.9* 27.4* 22.2* 32.7*  MCV 80.2 81.6 83.5 86.0 84.1  PLT 145* 136* 115* 229 123*   Basic Metabolic Panel: Recent Labs  Lab 02/08/18 1306 02/09/18 0640 02/11/18 1248 02/14/18 0420 02/15/18 0314  NA 127* 131* 131* 132* 130*  K 3.7 3.6 4.8 5.7* 5.2*  CL 88* 99 102 102 104  CO2 24 20* 20* 22 17*  GLUCOSE 117* 139* 100* 109* 110*  BUN 41* 30* 11 13 13   CREATININE 2.41* 1.12* 0.62 0.74 0.59  CALCIUM 7.9* 7.0* 7.3* 7.6* 7.4*   GFR: Estimated Creatinine Clearance: 90.5 mL/min (by C-G formula based on SCr of 0.59 mg/dL). Liver Function Tests: Recent Labs  Lab 02/08/18 1306 02/14/18 0420 02/15/18 0314  AST 33 63* 102*  ALT 25 27 30   ALKPHOS 137* 95 101  BILITOT 0.9 0.4 0.6  PROT 7.5 5.8* 5.5*  ALBUMIN 2.3* 1.4* 1.4*   No results for input(s): LIPASE, AMYLASE in the last 168 hours.  No results for input(s): AMMONIA in the last 168 hours. Coagulation Profile: No results for input(s): INR, PROTIME in the last 168 hours. Cardiac Enzymes: No results for input(s): CKTOTAL, CKMB, CKMBINDEX, TROPONINI in the last 168 hours. BNP (last 3 results) No results for input(s): PROBNP in the last 8760 hours. HbA1C: No results for input(s): HGBA1C in the last 72 hours. CBG: No results for input(s): GLUCAP in the last 168 hours. Lipid Profile: No results for input(s): CHOL, HDL, LDLCALC, TRIG, CHOLHDL, LDLDIRECT in the last 72 hours. Thyroid Function Tests: No results for input(s): TSH, T4TOTAL, FREET4,  T3FREE, THYROIDAB in the last 72 hours. Anemia Panel: Recent Labs    02/14/18 1315  VITAMINB12 1,127*  FOLATE 8.0  FERRITIN 619*  TIBC 158*  IRON 21*  RETICCTPCT 6.8*   Sepsis Labs: Recent Labs  Lab 02/08/18 1306 02/08/18 1541 02/08/18 1953  LATICACIDVEN 2.1* 2.9* 2.8*    Recent Results (from the past 240 hour(s))  Blood Culture (routine x 2)     Status: Abnormal   Collection Time: 02/08/18  1:06 PM  Result Value Ref Range Status   Specimen Description   Final    BLOOD LEFT WRIST Performed at [facility], [address], [place], [place] [address]    Special Requests   Final    BOTTLES DRAWN AEROBIC ONLY Blood Culture results may not be optimal due to an inadequate volume of blood received in culture bottles Performed at [facility], [address], [place], [place] [address]    Culture  Setup Time   Final    AEROBIC BOTTLE ONLY GRAM POSITIVE COCCI Gram Stain Report Called to,Read Back By and Verified With: J DANIELS,RN @0228  02/09/18 MKELLY CRITICAL RESULT CALLED TO, READ BACK BY AND VERIFIED WITH: PHARMD L POOLE [id number] MLM Performed at [facility], [address], [place], [place] [address]    Culture STAPHYLOCOCCUS AUREUS (A)  Final   Report Status 02/11/2018 FINAL  Final   Organism ID, Bacteria STAPHYLOCOCCUS AUREUS  Final      Susceptibility   Staphylococcus aureus - MIC*    CIPROFLOXACIN <=0.5 SENSITIVE Sensitive     ERYTHROMYCIN >=8 RESISTANT Resistant     GENTAMICIN <=0.5 SENSITIVE Sensitive     OXACILLIN <=0.25 SENSITIVE Sensitive     TETRACYCLINE <=1 SENSITIVE Sensitive     VANCOMYCIN <=0.5 SENSITIVE Sensitive     TRIMETH/SULFA <=10 SENSITIVE Sensitive     CLINDAMYCIN RESISTANT Resistant     RIFAMPIN <=0.5 SENSITIVE Sensitive     Inducible Clindamycin POSITIVE Resistant     * STAPHYLOCOCCUS AUREUS  Blood Culture ID Panel (Reflexed)     Status: Abnormal   Collection Time: 02/08/18  1:06 PM  Result Value Ref Range Status   Enterococcus  species NOT DETECTED NOT DETECTED Final   Listeria monocytogenes NOT DETECTED NOT DETECTED Final   Staphylococcus species DETECTED (A) NOT DETECTED Final    Comment: CRITICAL RESULT CALLED TO, READ BACK BY AND VERIFIED WITH: PHARMD L POOLE [id number] MLM    Staphylococcus aureus (BCID) DETECTED (A) NOT DETECTED Final    Comment: Methicillin (oxacillin) susceptible Staphylococcus aureus (MSSA). Preferred therapy is anti staphylococcal beta lactam antibiotic (Cefazolin or Nafcillin), unless clinically contraindicated. CRITICAL RESULT CALLED TO, READ BACK BY AND VERIFIED WITH: PHARMD L POOLE [id number] MLM    Methicillin resistance NOT DETECTED NOT DETECTED Final   Streptococcus species NOT DETECTED NOT DETECTED Final   Streptococcus agalactiae NOT DETECTED NOT DETECTED Final   Streptococcus  pneumoniae NOT DETECTED NOT DETECTED Final   Streptococcus pyogenes NOT DETECTED NOT DETECTED Final   Acinetobacter baumannii NOT DETECTED NOT DETECTED Final   Enterobacteriaceae species NOT DETECTED NOT DETECTED Final   Enterobacter cloacae complex NOT DETECTED NOT DETECTED Final   Escherichia coli NOT DETECTED NOT DETECTED Final   Klebsiella oxytoca NOT DETECTED NOT DETECTED Final   Klebsiella pneumoniae NOT DETECTED NOT DETECTED Final   Proteus species NOT DETECTED NOT DETECTED Final   Serratia marcescens NOT DETECTED NOT DETECTED Final   Haemophilus influenzae NOT DETECTED NOT DETECTED Final   Neisseria meningitidis NOT DETECTED NOT DETECTED Final   Pseudomonas aeruginosa NOT DETECTED NOT DETECTED Final   Candida albicans NOT DETECTED NOT DETECTED Final   Candida glabrata NOT DETECTED NOT DETECTED Final   Candida krusei NOT DETECTED NOT DETECTED Final   Candida parapsilosis NOT DETECTED NOT DETECTED Final   Candida tropicalis NOT DETECTED NOT DETECTED Final    Comment: Performed at [facility], [address] [place], [place] [address]  Blood Culture (routine x 2)     Status: Abnormal    Collection Time: 02/08/18  1:13 PM  Result Value Ref Range Status   Specimen Description   Final    BLOOD LEFT ARM Performed at [facility], [address] [place], [place] [address]    Special Requests   Final    BOTTLES DRAWN AEROBIC AND ANAEROBIC Blood Culture adequate volume Performed at [facility], [address] [place], [place] [address]    Culture  Setup Time   Final    IN BOTH AEROBIC AND ANAEROBIC BOTTLES GRAM POSITIVE COCCI Gram Stain Report Called to,Read Back By and Verified With: J DANIELS,RN @0230  02/09/18 [facility] Performed at [facility], [address] [place], [place] [address]    Culture (A)  Final    STAPHYLOCOCCUS AUREUS SUSCEPTIBILITIES PERFORMED ON PREVIOUS CULTURE WITHIN THE LAST 5 DAYS. Performed at [facility], [address] [place], [place] [address]    Report Status 02/11/2018 FINAL  Final  Urine culture     Status: Abnormal   Collection Time: 02/08/18  1:19 PM  Result Value Ref Range Status   Specimen Description URINE, CLEAN CATCH  Final   Special Requests   Final    NONE Performed at [facility], [address] [place], [place] [address]    Culture >=100,000 COLONIES/mL STAPHYLOCOCCUS AUREUS (A)  Final   Report Status 02/10/2018 FINAL  Final   Organism ID, Bacteria STAPHYLOCOCCUS AUREUS (A)  Final      Susceptibility   Staphylococcus aureus - MIC*    CIPROFLOXACIN <=0.5 SENSITIVE Sensitive     GENTAMICIN <=0.5 SENSITIVE Sensitive     NITROFURANTOIN <=16 SENSITIVE Sensitive     OXACILLIN <=0.25 SENSITIVE Sensitive     TETRACYCLINE <=1 SENSITIVE Sensitive     VANCOMYCIN 1 SENSITIVE Sensitive     TRIMETH/SULFA <=10 SENSITIVE Sensitive     CLINDAMYCIN RESISTANT Resistant     RIFAMPIN <=0.5 SENSITIVE Sensitive     Inducible Clindamycin POSITIVE Resistant     * >=100,000 COLONIES/mL STAPHYLOCOCCUS AUREUS  MRSA PCR Screening     Status: None   Collection Time: 02/08/18  8:51 PM  Result Value Ref Range Status   MRSA by PCR NEGATIVE  NEGATIVE Final    Comment:        The GeneXpert MRSA Assay (FDA approved for NASAL specimens only), is one component of a comprehensive MRSA colonization surveillance program. It is not  intended to diagnose MRSA infection nor to guide or monitor treatment for MRSA infections. Performed at [facility], [address] [place], [place] [address]   Culture, blood (routine x 2)     Status: Abnormal   Collection Time: 02/11/18 12:59 PM  Result Value Ref Range Status   Specimen Description   Final    BLOOD LEFT HAND BOTTLES DRAWN AEROBIC AND ANAEROBIC Performed at [facility], [address] [place], [place] [address]    Special Requests   Final    Blood Culture adequate volume Performed at [facility], [address] [place], [place] [address]    Culture  Setup Time   Final    GRAM POSITIVE COCCI IN CLUSTERS RECOVERED FROM THE ANAEROBIC BOTTLE AND THE AEROBIC BOTTLE Gram Stain Report Called to,Read Back By and Verified With: MOTLEY,J. AT 1048 ON 02/12/2018 BY BAUGHAM,M. Performed at [facility] CRITICAL RESULT CALLED TO, READ BACK BY AND VERIFIED WITH: HURTH PHARMD AT 1402 ON [id number] BY SJW CRITICAL VALUE NOTED.  VALUE IS CONSISTENT WITH PREVIOUSLY REPORTED AND CALLED VALUE.    Culture (A)  Final    STAPHYLOCOCCUS AUREUS SUSCEPTIBILITIES PERFORMED ON PREVIOUS CULTURE WITHIN THE LAST 5 DAYS. Performed at [facility], [address] [place], [place] [address]    Report Status 02/14/2018 FINAL  Final  Culture, blood (routine x 2)     Status: None (Preliminary result)   Collection Time: 02/12/18 10:28 PM  Result Value Ref Range Status   Specimen Description BLOOD LEFT ARM  Final   Special Requests   Final    BOTTLES DRAWN AEROBIC AND ANAEROBIC Blood Culture adequate volume   Culture   Final    NO GROWTH 2 DAYS Performed at [facility], [address] [place], [place] [address]    Report Status PENDING  Incomplete  Culture, blood (routine x 2)     Status: None  (Preliminary result)   Collection Time: 02/12/18 10:30 PM  Result Value Ref Range Status   Specimen Description BLOOD LEFT HAND  Final   Special Requests   Final    BOTTLES DRAWN AEROBIC ONLY Blood Culture adequate volume   Culture   Final    NO GROWTH 2 DAYS Performed at [facility], [address] [place], [place] [address]    Report Status PENDING  Incomplete         Radiology Studies: No results found.      Scheduled Meds: . sodium chloride   Intravenous Once  . diclofenac  1 patch Transdermal BID  . metoprolol tartrate  2.5 mg Intravenous Q8H  . nicotine  14 mg Transdermal Daily  . pantoprazole  40 mg Oral BID  . sodium chloride flush  3 mL Intravenous Q12H   Continuous Infusions: . sodium chloride    .  ceFAZolin (ANCEF) IV 2 g (02/15/18 [id number])  . ferumoxytol       LOS: 7 days    Time spent: 30 minutes    Pratik Hoover BrunetteD Shah, DO [facility] Pager [phone]  If 7PM-7AM, please contact night-coverage [email or web address] Password [email or web address] 02/15/2018, 7:49 AM

## 2018-02-15 NOTE — Progress Notes (Signed)
  Subjective:  Patient denies nausea vomiting heartburn or abdominal pain.  No melena or rectal bleeding reported according to nursing staff.  Patient is complaining that she is only getting clear liquids.  When I saw patient yesterday afternoon and suggested advancing her diet she refused to do so.  Objective: Blood pressure 122/82, pulse 95, temperature 99.5 F (37.5 C), temperature source Oral, resp. rate (!) 43, height 5' 3" (1.6 m), weight 61.4 kg, SpO2 100 %. Patient is alert.  She remains mildly tachypneic. She seems upset and does not want to answer any questions.   Labs/studies Results:  CBC Latest Ref Rng & Units 02/15/2018 02/14/2018 02/11/2018  WBC 4.0 - 10.5 K/uL 30.3(H) 27.3(H) 23.8(H)  Hemoglobin 12.0 - 15.0 g/dL 9.9(L) 6.5(LL) 8.1(L)  Hematocrit 36.0 - 46.0 % 32.7(L) 22.2(L) 27.4(L)  Platelets 150 - 400 K/uL 123(L) 229 115(L)    CMP Latest Ref Rng & Units 02/15/2018 02/14/2018 02/11/2018  Glucose 70 - 99 mg/dL 110(H) 109(H) 100(H)  BUN 6 - 20 mg/dL _0 Creatinine 0.44 - 1.00 mg/dL 0.59 0.74 0.62  Sodium 135 - 145 mmol/L 130(L) 132(L) 131(L)  Potassium 3.5 - 5.1 mmol/L 5.2(H) 5.7(H) 4.8  Chloride 98 - 111 mmol/L 104 102 102  CO2 22 - 32 mmol/L 17(L) 22 20(L)  Calcium 8.9 - 10.3 mg/dL 7.4(L) 7.6(L) 7.3(L)  Total Protein 6.5 - 8.1 g/dL 5.5(L) 5.8(L) -  Total Bilirubin 0.3 - 1.2 mg/dL 0.6 0.4 -  Alkaline Phos 38 - 126 U/L 101 95 -  AST 15 - 41 U/L 102(H) 63(H) -  ALT 0 - 44 U/L 30 27 -    Hepatic Function Latest Ref Rng & Units 02/15/2018 02/14/2018 02/08/2018  Total Protein 6.5 - 8.1 g/dL 5.5(L) 5.8(L) 7.5  Albumin 3.5 - 5.0 g/dL 1.4(L) 1.4(L) 2.3(L)  AST 15 - 41 U/L 102(H) 63(H) 33  ALT 0 - 44 U/L _1 Alk Phosphatase 38 - 126 U/L 101 95 137(H)  Total Bilirubin 0.3 - 1.2 mg/dL 0.6 0.4 0.9  Bilirubin, Direct 0.0 - 0.2 mg/dL - 0.2 -    Serum haptoglobin 367 on 02/14/2018. LDH 326 on 02/14/2018(normal up to 192)  Assessment:  #1.  Acute on chronic anemia.  He  does not have evidence of B12 folate or iron deficiency.  Iron studies are consistent with anemia of chronic disease.  Her stool was guaiac negative.  I do not believe she has significant hemolysis.  AST and LDH are mildly elevated most likely due to pneumonia and endocarditis.  She has received 2 units of PRBCs and hemoglobin has jumped from 6.5-9.9.  #2.  Malnutrition.  Patient serum albumin is 1.4.  Should she develop evidence of fluid third spacing she would benefit from albumin infusion.  In the meantime patient encouraged to increase p.o. intake.  Recommendations:  Diet changed to regular diet. Calorie count for 72 hours.

## 2018-02-15 NOTE — Progress Notes (Signed)
Mid level placed order that said no additional pain medication may be given at this time. Will continue to monitor patient throughout shift.

## 2018-02-15 NOTE — Progress Notes (Signed)
Pt watches the clock all night for pain meds, asks to have time of next administration placed on board in room and will yell out non stop if she does not get her medication at the desired time. It is a constant negotiation with her between getting her pain meds and allowing lab draws.

## 2018-02-16 MED ORDER — METOPROLOL TARTRATE 25 MG PO TABS
25.0000 mg | ORAL_TABLET | Freq: Two times a day (BID) | ORAL | Status: DC
  Administered 2018-02-16 – 2018-02-24 (×18): 25 mg via ORAL
  Filled 2018-02-16 (×18): qty 1

## 2018-02-16 MED ORDER — HYDROMORPHONE HCL 1 MG/ML IJ SOLN
2.0000 mg | INTRAMUSCULAR | Status: DC
  Administered 2018-02-16 – 2018-02-17 (×9): 2 mg via INTRAVENOUS
  Filled 2018-02-16 (×8): qty 2

## 2018-02-16 NOTE — Progress Notes (Signed)
PROGRESS NOTE    Amy Mcknight  ZOX:[id number] DOB: 04-05-1995 DOA: 02/08/2018 PCP: Patient, No Pcp Per   Brief Narrative:  23 y.o.femalewith medical history significant forsubstance use disorderwho presents to the ED with 2 days of right lower back pain. Patient states symptoms began with acute onset while at rest. Symptoms have been persistent. She has noted associated diaphoresis, chills, shortness of breath, cough productive of brown sputum, and right chest wall pain. She denies any rashes or obvious skin changes. She reports good urine output without dysuria. She denies any abdominal pain, diarrhea, or constipation.  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 She continues to use recreational drugs by snorting nasally. She denies any alcohol use. She reports a history of withdrawal from opiates in the past.And is noted to be positive for marijuana and opiates on her UDS.  She is noted to be anemic and is status post 2 unit PRBC transfusion with improvement noted. No overt bleeding currently identified, but patient is complaining of some epigastric abdominal pain. As result of this pain, she has not been eating very much. She denies any nausea or vomiting.  GI has evaluated patient with no need for endoscopy noted at this time.   Assessment & Plan:   Principal Problem:   MSSA bacteremia Active Problems:   Cavitary pneumonia   AKI (acute kidney injury) (HCC)   Substance use disorder   Hyponatremia   IVDU (intravenous drug user)   Myositis   Normocytic anemia   Thrombocytopenia (HCC)   Cigarette smoker   Hepatitis C antibody test positive   Endocarditis of tricuspid valve   1-sepsis with early septic shock in the setting of MSSA bacteremia, tricuspid vegetation, paravertebral myositis and cavitary pneumonia. -Pneumonia lesions could also represent septic emboli -Case discussed with infectious disease doctor who has recommended continue supportive  care and continue IV Ancef -Continue to follow culture results and maintain on peripheral IV for now with supervised treatment. -At this moment plan is for 6 weeks of supervised IV treatment. -2D echo positive for tricuspid vegetation and moderate regurgitation. -DC PCA pump and placed back on IV pushes on a scheduled basis.  Palliative consulted who will see by Monday.  2-AKI (acute kidney injury) (HCC)-resolved -Continue close monitoring if patient will allow lab work, DC IV fluid  3-Substance use disorder/opiate withdrawal syndrome -Extensive cessation counseling has been provided.  -Patient was receptive, but has not really expressed future decisions about quitting recreational drugs.. -Continue adjusted dose of narcotics. -Patient expressing pain all over especially in her back.  4-tobacco abuse -Extensive cessation counseling provided. -nicotine patch offered.   5-Hyponatremia -Discontinue until further lab work can be obtained  6-positive hepatitis C antibody -Quantitative viral load test undetectable; suggesting that the patient was able to clear infection on her own -No treatment needed  -Patient educated about how these diseases transmitted and counseling provided regarding safe sex practices and to stop the use of IV drugs.  7-bilateral pedal edema -Apply TED hoses -Adjust IV fluid rates.  8-severe anemia-suspect hemolysis in the setting of acute infection -Anemia panel revealed some iron deficiency and stool occult is negative -Refusing further lab work at this time -We will check peripheral smear with haptoglobin pending.  LDH is elevated. -2 unit PRBC ordered on 2/12 with stable hemoglobin levels -Appreciate GI evaluation with no recommendations for endoscopy at this time -PPI IV twice daily changed to p.o. -Feraheme to be given today for some iron deficiency -Diet has been  advanced with calorie count in progress -Continue on telemetry for 1 more day and  switch IV metoprolol to oral dosing  DVT prophylaxis:SCDs Code Status:Full code Family Communication:No family at bedside. Disposition Plan:Remains inpatient, continue IV Ancef. Follow repeated blood culturesand maintain on peripheral IV due to drug paraphernalia that was found in the room.  Continue on calorie count.  Appreciate GI involvement.  Tele-sitter at bedside.  Pain management.  Will require long-term admission.  Palliative consulted for Monday to assist with pain management.  Consultants:  Infectious disease over the phone (Dr. Orvan Falconerampbell).  GI Dr. Karilyn Cotaehman  Procedures:  2D echo:  Fraction (55-60%), and no microscopic valve with moderate regurgitation and large vegetation. No wall motion normalities appreciated.  Antimicrobials:  Cefazolin 2/7->  Vancomycin 2/7-2/7  Subjective: Patient seen and evaluated today with ongoing pain complaints despite use of PCA.  She claims that the PCA pump is completely ineffective for her and she would like to go back to her previous dosing of IV Dilaudid.  Calorie count has been started and her diet has been advanced by GI.  She refuses lab work.  Objective: Vitals:   02/16/18 0000 02/16/18 0334 02/16/18 0428 02/16/18 0800  BP: 115/71  117/72 114/74  Pulse: (!) 119  (!) 117   Resp: (!) 31 (!) 30 (!) 30   Temp: 99.6 F (37.6 C)  99.3 F (37.4 C)   TempSrc: Oral  Oral Oral  SpO2: 100% 98% 100% 99%  Weight:   65.4 kg   Height:        Intake/Output Summary (Last 24 hours) at 02/16/2018 1128 Last data filed at 02/16/2018 0900 Gross per 24 hour  Intake 1822.08 ml  Output -  Net 1822.08 ml   Filed Weights   02/14/18 0500 02/15/18 0500 02/16/18 0428  Weight: 61.1 kg 61.4 kg 65.4 kg    Examination:  General exam: Appears calm  Respiratory system: Clear to auscultation. Respiratory effort normal. Cardiovascular system: S1 & S2 heard, RRR. No JVD, murmurs, rubs, gallops or clicks. No pedal edema.   Tachycardic Gastrointestinal system: Abdomen is nondistended, soft and nontender. No organomegaly or masses felt. Normal bowel sounds heard. Central nervous system: Alert and oriented. No focal neurological deficits. Extremities: Symmetric 5 x 5 power. Skin: No rashes, lesions or ulcers Psychiatry: Anxious and agitated    Data Reviewed: I have personally reviewed following labs and imaging studies  CBC: Recent Labs  Lab 02/11/18 1248 02/14/18 0420 02/15/18 0314  WBC 23.8* 27.3* 30.3*  HGB 8.1* 6.5* 9.9*  HCT 27.4* 22.2* 32.7*  MCV 83.5 86.0 84.1  PLT 115* 229 123*   Basic Metabolic Panel: Recent Labs  Lab 02/11/18 1248 02/14/18 0420 02/15/18 0314  NA 131* 132* 130*  K 4.8 5.7* 5.2*  CL 102 102 104  CO2 20* 22 17*  GLUCOSE 100* 109* 110*  BUN 11 13 13   CREATININE 0.62 0.74 0.59  CALCIUM 7.3* 7.6* 7.4*   GFR: Estimated Creatinine Clearance: 99.5 mL/min (by C-G formula based on SCr of 0.59 mg/dL). Liver Function Tests: Recent Labs  Lab 02/14/18 0420 02/15/18 0314  AST 63* 102*  ALT 27 30  ALKPHOS 95 101  BILITOT 0.4 0.6  PROT 5.8* 5.5*  ALBUMIN 1.4* 1.4*   No results for input(s): LIPASE, AMYLASE in the last 168 hours. No results for input(s): AMMONIA in the last 168 hours. Coagulation Profile: No results for input(s): INR, PROTIME in the last 168 hours. Cardiac Enzymes: No results for input(s): CKTOTAL, CKMB,  CKMBINDEX, TROPONINI in the last 168 hours. BNP (last 3 results) No results for input(s): PROBNP in the last 8760 hours. HbA1C: No results for input(s): HGBA1C in the last 72 hours. CBG: No results for input(s): GLUCAP in the last 168 hours. Lipid Profile: No results for input(s): CHOL, HDL, LDLCALC, TRIG, CHOLHDL, LDLDIRECT in the last 72 hours. Thyroid Function Tests: No results for input(s): TSH, T4TOTAL, FREET4, T3FREE, THYROIDAB in the last 72 hours. Anemia Panel: Recent Labs    02/14/18 1315  VITAMINB12 1,127*  FOLATE 8.0  FERRITIN  619*  TIBC 158*  IRON 21*  RETICCTPCT 6.8*   Sepsis Labs: No results for input(s): PROCALCITON, LATICACIDVEN in the last 168 hours.  Recent Results (from the past 240 hour(s))  Blood Culture (routine x 2)     Status: Abnormal   Collection Time: 02/08/18  1:06 PM  Result Value Ref Range Status   Specimen Description   Final    BLOOD LEFT WRIST Performed at [facility], [address] [place], [place] [address]    Special Requests   Final    BOTTLES DRAWN AEROBIC ONLY Blood Culture results may not be optimal due to an inadequate volume of blood received in culture bottles Performed at [facility], [address], [place], [place] [address]    Culture  Setup Time   Final    AEROBIC BOTTLE ONLY GRAM POSITIVE COCCI Gram Stain Report Called to,Read Back By and Verified With: J DANIELS,RN @0228  02/09/18 [facility] CRITICAL RESULT CALLED TO, READ BACK BY AND VERIFIED WITH: PHARMD L POOLE [id number] MLM Performed at [facility], [address] [place], [place] [address]    Culture STAPHYLOCOCCUS AUREUS (A)  Final   Report Status 02/11/2018 FINAL  Final   Organism ID, Bacteria STAPHYLOCOCCUS AUREUS  Final      Susceptibility   Staphylococcus aureus - MIC*    CIPROFLOXACIN <=0.5 SENSITIVE Sensitive     ERYTHROMYCIN >=8 RESISTANT Resistant     GENTAMICIN <=0.5 SENSITIVE Sensitive     OXACILLIN <=0.25 SENSITIVE Sensitive     TETRACYCLINE <=1 SENSITIVE Sensitive     VANCOMYCIN <=0.5 SENSITIVE Sensitive     TRIMETH/SULFA <=10 SENSITIVE Sensitive     CLINDAMYCIN RESISTANT Resistant     RIFAMPIN <=0.5 SENSITIVE Sensitive     Inducible Clindamycin POSITIVE Resistant     * STAPHYLOCOCCUS AUREUS  Blood Culture ID Panel (Reflexed)     Status: Abnormal   Collection Time: 02/08/18  1:06 PM  Result Value Ref Range Status   Enterococcus species NOT DETECTED NOT DETECTED Final   Listeria monocytogenes NOT DETECTED NOT DETECTED Final   Staphylococcus species DETECTED (A) NOT DETECTED Final     Comment: CRITICAL RESULT CALLED TO, READ BACK BY AND VERIFIED WITH: PHARMD L POOLE [id number] MLM    Staphylococcus aureus (BCID) DETECTED (A) NOT DETECTED Final    Comment: Methicillin (oxacillin) susceptible Staphylococcus aureus (MSSA). Preferred therapy is anti staphylococcal beta lactam antibiotic (Cefazolin or Nafcillin), unless clinically contraindicated. CRITICAL RESULT CALLED TO, READ BACK BY AND VERIFIED WITH: PHARMD L POOLE [id number] MLM    Methicillin resistance NOT DETECTED NOT DETECTED Final   Streptococcus species NOT DETECTED NOT DETECTED Final   Streptococcus agalactiae NOT DETECTED NOT DETECTED Final   Streptococcus pneumoniae NOT DETECTED NOT DETECTED Final   Streptococcus pyogenes NOT DETECTED NOT DETECTED Final   Acinetobacter baumannii NOT DETECTED NOT DETECTED Final   Enterobacteriaceae species NOT DETECTED NOT DETECTED Final   Enterobacter cloacae  complex NOT DETECTED NOT DETECTED Final   Escherichia coli NOT DETECTED NOT DETECTED Final   Klebsiella oxytoca NOT DETECTED NOT DETECTED Final   Klebsiella pneumoniae NOT DETECTED NOT DETECTED Final   Proteus species NOT DETECTED NOT DETECTED Final   Serratia marcescens NOT DETECTED NOT DETECTED Final   Haemophilus influenzae NOT DETECTED NOT DETECTED Final   Neisseria meningitidis NOT DETECTED NOT DETECTED Final   Pseudomonas aeruginosa NOT DETECTED NOT DETECTED Final   Candida albicans NOT DETECTED NOT DETECTED Final   Candida glabrata NOT DETECTED NOT DETECTED Final   Candida krusei NOT DETECTED NOT DETECTED Final   Candida parapsilosis NOT DETECTED NOT DETECTED Final   Candida tropicalis NOT DETECTED NOT DETECTED Final    Comment: Performed at [facility], [address], [place], [place] [address]  Blood Culture (routine x 2)     Status: Abnormal   Collection Time: 02/08/18  1:13 PM  Result Value Ref Range Status   Specimen Description   Final    BLOOD LEFT ARM Performed at [facility], [address] [place], [place] [address]    Special Requests   Final    BOTTLES DRAWN AEROBIC AND ANAEROBIC Blood Culture adequate volume Performed at [facility], [address], [place], [place] [address]    Culture  Setup Time   Final    IN BOTH AEROBIC AND ANAEROBIC BOTTLES GRAM POSITIVE COCCI Gram Stain Report Called to,Read Back By and Verified With: J DANIELS,RN @0230  02/09/18 [facility] Performed at [facility], [address] [place], [place] [address]    Culture (A)  Final    STAPHYLOCOCCUS AUREUS SUSCEPTIBILITIES PERFORMED ON PREVIOUS CULTURE WITHIN THE LAST 5 DAYS. Performed at [facility], [address] [place], [place] [address]    Report Status 02/11/2018 FINAL  Final  Urine culture     Status: Abnormal   Collection Time: 02/08/18  1:19 PM  Result Value Ref Range Status   Specimen Description URINE, CLEAN CATCH  Final   Special Requests   Final    NONE Performed at [facility], [address], [place], [place] [address]    Culture >=100,000 COLONIES/mL STAPHYLOCOCCUS AUREUS (A)  Final   Report Status 02/10/2018 FINAL  Final   Organism ID, Bacteria STAPHYLOCOCCUS AUREUS (A)  Final      Susceptibility   Staphylococcus aureus - MIC*    CIPROFLOXACIN <=0.5 SENSITIVE Sensitive     GENTAMICIN <=0.5 SENSITIVE Sensitive     NITROFURANTOIN <=16 SENSITIVE Sensitive     OXACILLIN <=0.25 SENSITIVE Sensitive     TETRACYCLINE <=1 SENSITIVE Sensitive     VANCOMYCIN 1 SENSITIVE Sensitive     TRIMETH/SULFA <=10 SENSITIVE Sensitive     CLINDAMYCIN RESISTANT Resistant     RIFAMPIN <=0.5 SENSITIVE Sensitive     Inducible Clindamycin POSITIVE Resistant     * >=100,000 COLONIES/mL STAPHYLOCOCCUS AUREUS  MRSA PCR Screening     Status: None   Collection Time: 02/08/18  8:51 PM  Result Value Ref Range Status   MRSA by PCR NEGATIVE NEGATIVE Final    Comment:        The GeneXpert MRSA Assay (FDA approved for NASAL specimens only), is one component of a comprehensive MRSA  colonization surveillance program. It is not intended to diagnose MRSA infection nor to guide or monitor treatment for MRSA infections. Performed at [facility], [address], [place], [place] [address]   Culture, blood (routine x 2)     Status:  Abnormal   Collection Time: 02/11/18 12:59 PM  Result Value Ref Range Status   Specimen Description   Final    BLOOD LEFT HAND BOTTLES DRAWN AEROBIC AND ANAEROBIC Performed at [facility], [address] [place], [place] [address]    Special Requests   Final    Blood Culture adequate volume Performed at [facility], [address] [place], [place] [address]    Culture  Setup Time   Final    GRAM POSITIVE COCCI IN CLUSTERS RECOVERED FROM THE ANAEROBIC BOTTLE AND THE AEROBIC BOTTLE Gram Stain Report Called to,Read Back By and Verified With: MOTLEY,J. AT 1048 ON 02/12/2018 BY BAUGHAM,M. Performed at [facility] CRITICAL RESULT CALLED TO, READ BACK BY AND VERIFIED WITH: HURTH PHARMD AT 1402 ON [id number] BY SJW CRITICAL VALUE NOTED.  VALUE IS CONSISTENT WITH PREVIOUSLY REPORTED AND CALLED VALUE.    Culture (A)  Final    STAPHYLOCOCCUS AUREUS SUSCEPTIBILITIES PERFORMED ON PREVIOUS CULTURE WITHIN THE LAST 5 DAYS. Performed at [facility], [address] [place], [place] [address]    Report Status 02/14/2018 FINAL  Final  Culture, blood (routine x 2)     Status: None (Preliminary result)   Collection Time: 02/12/18 10:28 PM  Result Value Ref Range Status   Specimen Description BLOOD LEFT ARM  Final   Special Requests   Final    BOTTLES DRAWN AEROBIC AND ANAEROBIC Blood Culture adequate volume   Culture   Final    NO GROWTH 4 DAYS Performed at [facility], [address] [place], [place] [address]    Report Status PENDING  Incomplete  Culture, blood (routine x 2)     Status: None (Preliminary result)   Collection Time: 02/12/18 10:30 PM  Result Value Ref Range Status   Specimen Description BLOOD LEFT HAND  Final    Special Requests   Final    BOTTLES DRAWN AEROBIC ONLY Blood Culture adequate volume   Culture   Final    NO GROWTH 4 DAYS Performed at [facility], [address], [place], [place] [address]    Report Status PENDING  Incomplete         Radiology Studies: No results found.      Scheduled Meds: . sodium chloride   Intravenous Once  . diclofenac  1 patch Transdermal BID  .  HYDROmorphone (DILAUDID) injection  2 mg Intravenous Q3H  . metoprolol tartrate  2.5 mg Intravenous Q8H  . nicotine  14 mg Transdermal Daily  . pantoprazole  40 mg Oral BID  . sodium chloride flush  3 mL Intravenous Q12H   Continuous Infusions: . sodium chloride 75 mL/hr at 02/16/18 1056  .  ceFAZolin (ANCEF) IV 2 g (02/16/18 0536)     LOS: 8 days    Time spent: 30 minutes    Pratik Hoover Brunette, DO [facility] Pager [phone]  If 7PM-7AM, please contact night-coverage [email or web address] Password [facility] 02/16/2018, 11:28 AM

## 2018-02-16 NOTE — Progress Notes (Signed)
MD called back and stated to leave the patient on the PCA pump and let her discuss with the day time MD of switching to IV pain medication instead of the PCA pump. Will continue to monitor throughout shift.

## 2018-02-16 NOTE — Progress Notes (Signed)
Patients PCA pump was alarming. Went into patients room to change sensor on finger. Patient stated the sensor was fine Im fine. Sensor in place. Will continue to monitor patient throughout shift.

## 2018-02-16 NOTE — Progress Notes (Signed)
Patients PCA pump is alarming. Went into patients room to assess patient and pump. Patient stated she wanted to go back to her IV pain medication and get it rid of the PCA pump. Paged MD to call me. Will continue to monitor throughout shift.

## 2018-02-16 NOTE — Progress Notes (Signed)
Palliative Medicine RN Note: Consult order noted for symptom management.  PMT provider is not available at [facility] today but will return Monday 2/17. If Ms Alber remains admitted at that time, our provider will evaluate her then.  Please call our office if recommendations are needed in the interim. I have paged Dr Sherryll Burger (referring MD) to update him on delay.  Margret Chance Romey Cohea, RN, BSN, [facility] Palliative Medicine Team 02/16/2018 9:45 AM Office [phone]

## 2018-02-16 NOTE — Progress Notes (Signed)
Nutrition Brief Note:  RD has received order for 72-hr Calorie Count.  Nursing and nutrition services staff have been educated regarding process and patient has been made aware.  Start today at lunch through Monday lunch.  Results and full assessment to follow gathering of data.  Royann Shivers MS,RD,CSG,LDN Office: [phone] Pager: [phone]

## 2018-02-16 NOTE — Progress Notes (Signed)
Patient is refusing to let lab draw her blood this morning for lab work. MD notified. Will continue to monitor throughout shift.

## 2018-02-17 LAB — CULTURE, BLOOD (ROUTINE X 2)
CULTURE: NO GROWTH
CULTURE: NO GROWTH
Special Requests: ADEQUATE
Special Requests: ADEQUATE

## 2018-02-17 LAB — LACTIC ACID, PLASMA: Lactic Acid, Venous: 1.2 mmol/L (ref 0.5–1.9)

## 2018-02-17 MED ORDER — HYDROMORPHONE HCL 2 MG PO TABS
3.0000 mg | ORAL_TABLET | ORAL | Status: DC
  Administered 2018-02-17 – 2018-02-18 (×7): 3 mg via ORAL
  Filled 2018-02-17 (×7): qty 2

## 2018-02-17 NOTE — Progress Notes (Signed)
PROGRESS NOTE    Amy Mcknight  PRF:[id number] DOB: 06/11/1995 DOA: 02/08/2018 PCP: Patient, No Pcp Per   Brief Narrative:  23 y.o.femalewith medical history significant forsubstance use disorderwho presents to the ED with 2 days of right lower back pain. Patient states symptoms began with acute onset while at rest. Symptoms have been persistent. She has noted associated diaphoresis, chills, shortness of breath, cough productive of brown sputum, and right chest wall pain. She denies any rashes or obvious skin changes. She reports good urine output without dysuria. She denies any abdominal pain, diarrhea, or constipation.  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 She continues to use recreational drugs by snorting nasally. She denies any alcohol use. She reports a history of withdrawal from opiates in the past.And is noted to be positive for marijuana and opiates on her UDS.  She is noted to be anemicand is status post 2 unit PRBC transfusion with improvement noted. No overt bleeding currently identified, but patient is complaining of some epigastric abdominal pain. As result of this pain, she has not been eating very much. She denies any nausea or vomiting.GI has evaluated patient with no need for endoscopy noted at this time.  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 -Pneumonia lesions could also represent septic emboli -Case discussed with infectious disease doctor who has recommended continue supportive care  and continue IV Ancef -Continue to follow culture results and maintain on peripheral IV for nowwithsupervised treatment. -At this moment plan is for 6 weeks of supervised IV treatment. -2D echo positive for tricuspid vegetation and moderate regurgitation. -Attempt trial of oral narcotics today. Palliative consulted who will see by Monday.  2-AKI (acute kidney injury) (HCC)-resolved -Continue close monitoring if patient will allow lab work, DC IV fluid  3-Substance use disorder/opiate withdrawal syndrome -Extensive cessation counseling has been provided.  -Patient was receptive, but has not really expressed future decisions about quitting recreational drugs.. -Continue adjusted dose of narcotics. -Patient expressing pain all over especially in her back.  4-tobacco abuse -Extensive cessation counseling provided. -nicotine patch offered.   5-Hyponatremia -Discontinue until further lab work can be obtained  6-positive hepatitis C antibody -Quantitative viral load test undetectable; suggesting that the patient was able to clear infection on her own -No treatment needed  -Patient educated about how these diseases transmitted and counseling provided regarding safe sex practices and to stop the use of IV drugs.  7-bilateral pedal edema -Apply TED hoses -Adjust IV fluid rates.  8-severe anemia-suspecthemolysis in the setting of acute infection -Anemia panelrevealed some iron deficiency and stool occult is negative -We will recheck a.m. labs today and continue every other day measurements -We will check peripheral smear with haptoglobin pending. LDH is elevated. -2 unit PRBC orderedon 2/12 with stable hemoglobin levels -Appreciate GI evaluation with no recommendations for endoscopy at this time -PPI IV twice dailychanged to p.o. -Feraheme to be given today for some iron deficiency -Diet has been advanced with calorie count in progress -Continue on telemetry for 1 more day  and  switch IV metoprolol to oral dosing  DVT prophylaxis:SCDs Code Status:Full code Family Communication:No family at bedside. Disposition Plan:Remains inpatient, continue IV Ancef. Follow repeated blood culturesandmaintain on peripheral IV due to drug paraphernalia that was found in the room.  Continue on calorie count.  Appreciate GI involvement.  Tele-sitter at bedside.  Pain management now with oral narcotics.  Will require long-term admission.  Palliative consulted for Monday to assist with pain management.  Lab recheck pending.  Consultants:  Infectious disease over the phone (Dr. Orvan Falconerampbell).  GI Dr. Karilyn Cotaehman  Procedures:  2D echo:  Fraction (55-60%), and no microscopic valve with moderate regurgitation and large vegetation. No wall motion normalities appreciated.  Antimicrobials:  Cefazolin 2/7->  Vancomycin 2/7-2/7  Subjective: Patient seen and evaluated today with no new acute complaints or concerns. No acute concerns or events noted overnight.  Her pain is actually well controlled, but she has been refusing lab draws, but is agreeable after discussion.  She is also agreeable to conversion to oral narcotics this morning which I will attempt.  Objective: Vitals:   02/16/18 1930 02/16/18 2146 02/17/18 0500 02/17/18 0547  BP:  (!) 138/94  116/85  Pulse:  (!) 144  (!) 130  Resp:  (!) 46  (!) 40  Temp:  97.6 F (36.4 C)  98.7 F (37.1 C)  TempSrc:  Oral  Oral  SpO2: 93% 90%  92%  Weight:   65.3 kg   Height:        Intake/Output Summary (Last 24 hours) at 02/17/2018 1110 Last data filed at 02/16/2018 2117 Gross per 24 hour  Intake 123 ml  Output 800 ml  Net -677 ml   Filed Weights   02/15/18 0500 02/16/18 0428 02/17/18 0500  Weight: 61.4 kg 65.4 kg 65.3 kg    Examination:  General exam: Appears calm and comfortable  Respiratory system: Clear to auscultation. Respiratory effort normal. Cardiovascular system: S1 & S2 heard, RRR. No JVD, murmurs,  rubs, gallops or clicks. No pedal edema. Gastrointestinal system: Abdomen is nondistended, soft and nontender. No organomegaly or masses felt. Normal bowel sounds heard. Central nervous system: Alert and oriented. No focal neurological deficits. Extremities: Symmetric 5 x 5 power. Skin: No rashes, lesions or ulcers Psychiatry: Judgement and insight appear normal. Mood & affect appropriate.     Data Reviewed: I have personally reviewed following labs and imaging studies  CBC: Recent Labs  Lab 02/11/18 1248 02/14/18 0420 02/15/18 0314  WBC 23.8* 27.3* 30.3*  HGB 8.1* 6.5* 9.9*  HCT 27.4* 22.2* 32.7*  MCV 83.5 86.0 84.1  PLT 115* 229 123*   Basic Metabolic Panel: Recent Labs  Lab 02/11/18 1248 02/14/18 0420 02/15/18 0314  NA 131* 132* 130*  K 4.8 5.7* 5.2*  CL 102 102 104  CO2 20* 22 17*  GLUCOSE 100* 109* 110*  BUN 11 13 13   CREATININE 0.62 0.74 0.59  CALCIUM 7.3* 7.6* 7.4*   GFR: Estimated Creatinine Clearance: 98.6 mL/min (by C-G formula based on SCr of 0.59 mg/dL). Liver Function Tests: Recent Labs  Lab 02/14/18 0420 02/15/18 0314  AST 63* 102*  ALT 27 30  ALKPHOS 95 101  BILITOT 0.4 0.6  PROT 5.8* 5.5*  ALBUMIN 1.4* 1.4*   No results for input(s): LIPASE, AMYLASE in the last 168 hours. No results for input(s): AMMONIA in the last 168 hours. Coagulation Profile: No results for input(s): INR, PROTIME in the last 168 hours. Cardiac Enzymes: No results for input(s): CKTOTAL, CKMB, CKMBINDEX, TROPONINI in the  last 168 hours. BNP (last 3 results) No results for input(s): PROBNP in the last 8760 hours. HbA1C: No results for input(s): HGBA1C in the last 72 hours. CBG: No results for input(s): GLUCAP in the last 168 hours. Lipid Profile: No results for input(s): CHOL, HDL, LDLCALC, TRIG, CHOLHDL, LDLDIRECT in the last 72 hours. Thyroid Function Tests: No results for input(s): TSH, T4TOTAL, FREET4, T3FREE, THYROIDAB in the last 72 hours. Anemia  Panel: Recent Labs    02/14/18 1315  VITAMINB12 1,127*  FOLATE 8.0  FERRITIN 619*  TIBC 158*  IRON 21*  RETICCTPCT 6.8*   Sepsis Labs: No results for input(s): PROCALCITON, LATICACIDVEN in the last 168 hours.  Recent Results (from the past 240 hour(s))  Blood Culture (routine x 2)     Status: Abnormal   Collection Time: 02/08/18  1:06 PM  Result Value Ref Range Status   Specimen Description   Final    BLOOD LEFT WRIST Performed at [facility], [address], [place], [place] [address]    Special Requests   Final    BOTTLES DRAWN AEROBIC ONLY Blood Culture results may not be optimal due to an inadequate volume of blood received in culture bottles Performed at [facility], [address], [place], [place] [address]    Culture  Setup Time   Final    AEROBIC BOTTLE ONLY GRAM POSITIVE COCCI Gram Stain Report Called to,Read Back By and Verified With: J DANIELS,RN @0228  02/09/18 [facility] CRITICAL RESULT CALLED TO, READ BACK BY AND VERIFIED WITH: PHARMD L POOLE [phone] MLM Performed at [facility], [address] [place], [place] [address]    Culture STAPHYLOCOCCUS AUREUS (A)  Final   Report Status 02/11/2018 FINAL  Final   Organism ID, Bacteria STAPHYLOCOCCUS AUREUS  Final      Susceptibility   Staphylococcus aureus - MIC*    CIPROFLOXACIN <=0.5 SENSITIVE Sensitive     ERYTHROMYCIN >=8 RESISTANT Resistant     GENTAMICIN <=0.5 SENSITIVE Sensitive     OXACILLIN <=0.25 SENSITIVE Sensitive     TETRACYCLINE <=1 SENSITIVE Sensitive     VANCOMYCIN <=0.5 SENSITIVE Sensitive     TRIMETH/SULFA <=10 SENSITIVE Sensitive     CLINDAMYCIN RESISTANT Resistant     RIFAMPIN <=0.5 SENSITIVE Sensitive     Inducible Clindamycin POSITIVE Resistant     * STAPHYLOCOCCUS AUREUS  Blood Culture ID Panel (Reflexed)     Status: Abnormal   Collection Time: 02/08/18  1:06 PM  Result Value Ref Range Status   Enterococcus species NOT DETECTED NOT DETECTED Final   Listeria monocytogenes NOT  DETECTED NOT DETECTED Final   Staphylococcus species DETECTED (A) NOT DETECTED Final    Comment: CRITICAL RESULT CALLED TO, READ BACK BY AND VERIFIED WITH: PHARMD L POOLE [phone] MLM    Staphylococcus aureus (BCID) DETECTED (A) NOT DETECTED Final    Comment: Methicillin (oxacillin) susceptible Staphylococcus aureus (MSSA). Preferred therapy is anti staphylococcal beta lactam antibiotic (Cefazolin or Nafcillin), unless clinically contraindicated. CRITICAL RESULT CALLED TO, READ BACK BY AND VERIFIED WITH: PHARMD L POOLE [id number] MLM    Methicillin resistance NOT DETECTED NOT DETECTED Final   Streptococcus species NOT DETECTED NOT DETECTED Final   Streptococcus agalactiae NOT DETECTED NOT DETECTED Final   Streptococcus pneumoniae NOT DETECTED NOT DETECTED Final   Streptococcus pyogenes NOT DETECTED NOT DETECTED Final   Acinetobacter baumannii NOT DETECTED NOT DETECTED Final   Enterobacteriaceae species NOT DETECTED NOT DETECTED Final   Enterobacter cloacae complex NOT DETECTED NOT  DETECTED Final   Escherichia coli NOT DETECTED NOT DETECTED Final   Klebsiella oxytoca NOT DETECTED NOT DETECTED Final   Klebsiella pneumoniae NOT DETECTED NOT DETECTED Final   Proteus species NOT DETECTED NOT DETECTED Final   Serratia marcescens NOT DETECTED NOT DETECTED Final   Haemophilus influenzae NOT DETECTED NOT DETECTED Final   Neisseria meningitidis NOT DETECTED NOT DETECTED Final   Pseudomonas aeruginosa NOT DETECTED NOT DETECTED Final   Candida albicans NOT DETECTED NOT DETECTED Final   Candida glabrata NOT DETECTED NOT DETECTED Final   Candida krusei NOT DETECTED NOT DETECTED Final   Candida parapsilosis NOT DETECTED NOT DETECTED Final   Candida tropicalis NOT DETECTED NOT DETECTED Final    Comment: Performed at [facility], [address], [place], [place] [address]  Blood Culture (routine x 2)     Status: Abnormal   Collection Time: 02/08/18  1:13 PM  Result Value Ref Range Status    Specimen Description   Final    BLOOD LEFT ARM Performed at [facility], [address] [place], [place] [address]    Special Requests   Final    BOTTLES DRAWN AEROBIC AND ANAEROBIC Blood Culture adequate volume Performed at [facility], [address], [place], [place] [address]    Culture  Setup Time   Final    IN BOTH AEROBIC AND ANAEROBIC BOTTLES GRAM POSITIVE COCCI Gram Stain Report Called to,Read Back By and Verified With: J DANIELS,RN @0230  02/09/18 [facility] Performed at [facility], [address] [place], [place] [address]    Culture (A)  Final    STAPHYLOCOCCUS AUREUS SUSCEPTIBILITIES PERFORMED ON PREVIOUS CULTURE WITHIN THE LAST 5 DAYS. Performed at [facility], [address] [place], [place] [address]    Report Status 02/11/2018 FINAL  Final  Urine culture     Status: Abnormal   Collection Time: 02/08/18  1:19 PM  Result Value Ref Range Status   Specimen Description URINE, CLEAN CATCH  Final   Special Requests   Final    NONE Performed at [facility], [address], [place], [place] [address]    Culture >=100,000 COLONIES/mL STAPHYLOCOCCUS AUREUS (A)  Final   Report Status 02/10/2018 FINAL  Final   Organism ID, Bacteria STAPHYLOCOCCUS AUREUS (A)  Final      Susceptibility   Staphylococcus aureus - MIC*    CIPROFLOXACIN <=0.5 SENSITIVE Sensitive     GENTAMICIN <=0.5 SENSITIVE Sensitive     NITROFURANTOIN <=16 SENSITIVE Sensitive     OXACILLIN <=0.25 SENSITIVE Sensitive     TETRACYCLINE <=1 SENSITIVE Sensitive     VANCOMYCIN 1 SENSITIVE Sensitive     TRIMETH/SULFA <=10 SENSITIVE Sensitive     CLINDAMYCIN RESISTANT Resistant     RIFAMPIN <=0.5 SENSITIVE Sensitive     Inducible Clindamycin POSITIVE Resistant     * >=100,000 COLONIES/mL STAPHYLOCOCCUS AUREUS  MRSA PCR Screening     Status: None   Collection Time: 02/08/18  8:51 PM  Result Value Ref Range Status   MRSA by PCR NEGATIVE NEGATIVE Final    Comment:        The GeneXpert MRSA Assay  (FDA approved for NASAL specimens only), is one component of a comprehensive MRSA colonization surveillance program. It is not intended to diagnose MRSA infection nor to guide or monitor treatment for MRSA infections. Performed at [facility], [address], [place], [place] [address]   Culture, blood (routine x 2)     Status: Abnormal   Collection  Time: 02/11/18 12:59 PM  Result Value Ref Range Status   Specimen Description   Final    BLOOD LEFT HAND BOTTLES DRAWN AEROBIC AND ANAEROBIC Performed at [facility], [address] [place], [place] [address]    Special Requests   Final    Blood Culture adequate volume Performed at [facility], [address] [place], [place] [address]    Culture  Setup Time   Final    GRAM POSITIVE COCCI IN CLUSTERS RECOVERED FROM THE ANAEROBIC BOTTLE AND THE AEROBIC BOTTLE Gram Stain Report Called to,Read Back By and Verified With: MOTLEY,J. AT 1048 ON 02/12/2018 BY BAUGHAM,M. Performed at [facility] CRITICAL RESULT CALLED TO, READ BACK BY AND VERIFIED WITH: HURTH PHARMD AT 1402 ON [id number] BY SJW CRITICAL VALUE NOTED.  VALUE IS CONSISTENT WITH PREVIOUSLY REPORTED AND CALLED VALUE.    Culture (A)  Final    STAPHYLOCOCCUS AUREUS SUSCEPTIBILITIES PERFORMED ON PREVIOUS CULTURE WITHIN THE LAST 5 DAYS. Performed at [facility], [address] [place], [place] [address]    Report Status 02/14/2018 FINAL  Final  Culture, blood (routine x 2)     Status: None   Collection Time: 02/12/18 10:28 PM  Result Value Ref Range Status   Specimen Description BLOOD LEFT ARM  Final   Special Requests   Final    BOTTLES DRAWN AEROBIC AND ANAEROBIC Blood Culture adequate volume   Culture   Final    NO GROWTH 5 DAYS Performed at [facility], [address], [place], [place] [address]    Report Status 02/17/2018 FINAL  Final  Culture, blood (routine x 2)     Status: None   Collection Time: 02/12/18 10:30 PM  Result Value Ref Range Status    Specimen Description BLOOD LEFT HAND  Final   Special Requests   Final    BOTTLES DRAWN AEROBIC ONLY Blood Culture adequate volume   Culture   Final    NO GROWTH 5 DAYS Performed at [facility], [address] [place], [place] [address]    Report Status 02/17/2018 FINAL  Final         Radiology Studies: No results found.      Scheduled Meds: . sodium chloride   Intravenous Once  . diclofenac  1 patch Transdermal BID  . HYDROmorphone  3 mg Oral Q3H  . metoprolol tartrate  25 mg Oral BID  . nicotine  14 mg Transdermal Daily  . pantoprazole  40 mg Oral BID  . sodium chloride flush  3 mL Intravenous Q12H   Continuous Infusions: .  ceFAZolin (ANCEF) IV 2 g (02/17/18 6195)     LOS: 9 days    Time spent: 30 minutes    Pratik Hoover Brunette, DO [facility] Pager [phone]  If 7PM-7AM, please contact night-coverage [email or web address] Password [email or web address] 02/17/2018, 11:10 AM

## 2018-02-18 LAB — CBC
HEMATOCRIT: 33.6 % — AB (ref 36.0–46.0)
Hemoglobin: 10 g/dL — ABNORMAL LOW (ref 12.0–15.0)
MCH: 26.1 pg (ref 26.0–34.0)
MCHC: 29.8 g/dL — ABNORMAL LOW (ref 30.0–36.0)
MCV: 87.7 fL (ref 80.0–100.0)
Platelets: 123 10*3/uL — ABNORMAL LOW (ref 150–400)
RBC: 3.83 MIL/uL — ABNORMAL LOW (ref 3.87–5.11)
RDW: 20.6 % — ABNORMAL HIGH (ref 11.5–15.5)
WBC: 21.9 10*3/uL — ABNORMAL HIGH (ref 4.0–10.5)
nRBC: 0 % (ref 0.0–0.2)

## 2018-02-18 LAB — MAGNESIUM: Magnesium: 1.7 mg/dL (ref 1.7–2.4)

## 2018-02-18 LAB — COMPREHENSIVE METABOLIC PANEL
ALT: 21 U/L (ref 0–44)
AST: 70 U/L — ABNORMAL HIGH (ref 15–41)
Albumin: 1.4 g/dL — ABNORMAL LOW (ref 3.5–5.0)
Alkaline Phosphatase: 100 U/L (ref 38–126)
Anion gap: 9 (ref 5–15)
BUN: 8 mg/dL (ref 6–20)
CHLORIDE: 103 mmol/L (ref 98–111)
CO2: 18 mmol/L — ABNORMAL LOW (ref 22–32)
Calcium: 7.4 mg/dL — ABNORMAL LOW (ref 8.9–10.3)
Creatinine, Ser: 0.63 mg/dL (ref 0.44–1.00)
GFR calc Af Amer: >60 mL/min (ref >60)
GFR calc non Af Amer: >60 mL/min (ref >60)
Glucose, Bld: 103 mg/dL — ABNORMAL HIGH (ref 70–99)
POTASSIUM: 4.7 mmol/L (ref 3.5–5.1)
Sodium: 130 mmol/L — ABNORMAL LOW (ref 135–145)
Total Bilirubin: 0.7 mg/dL (ref 0.3–1.2)
Total Protein: 6 g/dL — ABNORMAL LOW (ref 6.5–8.1)

## 2018-02-18 MED ORDER — HYDROMORPHONE HCL 2 MG PO TABS
1.0000 mg | ORAL_TABLET | ORAL | Status: DC
  Administered 2018-02-18 (×2): 1 mg via ORAL
  Filled 2018-02-18 (×2): qty 1

## 2018-02-18 MED ORDER — HYDROMORPHONE HCL 2 MG PO TABS
3.0000 mg | ORAL_TABLET | ORAL | Status: DC
  Administered 2018-02-18 – 2018-02-19 (×6): 3 mg via ORAL
  Filled 2018-02-18 (×7): qty 2

## 2018-02-18 NOTE — Progress Notes (Signed)
Patient and boyfriend had an arguement, boyfriend left.  Boy friend called patient's nurse and told nurse patient had "pot" in her purse.  Ask him where exactly was the "pot"  located he stated it was a 'joint" and it is in her purse.  I went in patient's room told her about the call I had received and asked her if she had "pot" in her purse she stated no she didn't at that time, that she had a "roach" in it earlier but the boyfriend took it and I was welcomed to search her purse and room if I wanted too.

## 2018-02-18 NOTE — Progress Notes (Signed)
PROGRESS NOTE    Amy Mcknight  ZOX:[id number] DOB: 06-Dec-1995 DOA: 02/08/2018 PCP: Patient, No Pcp Per   Brief Narrative:  23 y.o.femalewith medical history significant forsubstance use disorderwho presents to the ED with 2 days of right lower back pain. Patient states symptoms began with acute onset while at rest. Symptoms have been persistent. She has noted associated diaphoresis, chills, shortness of breath, cough productive of brown sputum, and right chest wall pain. She denies any rashes or obvious skin changes. She reports good urine output without dysuria. She denies any abdominal pain, diarrhea, or constipation.  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 She continues to use recreational drugs by snorting nasally. She denies any alcohol use. She reports a history of withdrawal from opiates in the past.And is noted to be positive for marijuana and opiates on her UDS.  She is noted to be anemicand is status post 2 unit PRBC transfusion with improvement noted. No overt bleeding currently identified, but patient is complaining of some epigastric abdominal pain. As result of this pain, she has not been eating very much. She denies any nausea or vomiting.GI has evaluated patient with no need for endoscopy noted at this time.  She continues to have ongoing pain with calorie count in progress.  She is refusing blood draws.   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 -Pneumonia lesions could also represent septic  emboli -Case discussed with infectious disease doctor who has recommended continue supportive care and continue IV Ancef -Continue to follow culture results and maintain on peripheral IV for nowwithsupervised treatment. -At this moment plan is for 6 weeks of supervised IV treatment. -2D echo positive for tricuspid vegetation and moderate regurgitation. -Attempt trial of oral narcotics today.Palliative consulted who will see by Monday.  2-AKI (acute kidney injury) (HCC)-resolved -Continue close monitoringif patient will allow lab work, DC IV fluid  3-Substance use disorder/opiate withdrawal syndrome -Extensive cessation counseling has been provided.  -Patient was receptive, but has not really expressed future decisions about quitting recreational drugs.. -Continue adjusted dose of narcotics. -Patient expressing pain all over especially in her back.  4-tobacco abuse -Extensive cessation counseling provided. -nicotine patch offered.   5-Hyponatremia -Discontinue until further lab work can be obtained  6-positive hepatitis C antibody -Quantitative viral load test undetectable; suggesting that the patient was able to clear infection on her own -No treatment needed  -Patient educated about how these diseases transmitted and counseling provided regarding safe sex practices and to stop the use of IV drugs.  7-bilateral pedal edema -Apply TED hoses -Adjust IV fluid rates.  8-severe anemia-suspecthemolysis in the setting of acute infection -Anemia panelrevealed some iron deficiency and stool occult is negative -We will recheck a.m. labs today, if patient allows, and continue every other day measurements -We will check peripheral smear with haptoglobin pending. LDH is elevated. -2 unit PRBC orderedon 2/12 with stable hemoglobin levels -Appreciate GI evaluation with no recommendations for endoscopy at this time -PPI IV twice dailychanged to p.o. -Feraheme to be given today  for  some iron deficiency -Diet has been advanced with calorie count in progress   DVT prophylaxis:SCDs Code Status:Full code Family Communication:No family at bedside. Disposition Plan:Remains inpatient, continue IV Ancef. Follow repeated blood culturesandmaintain on peripheral IV due to drug paraphernalia that was found in the room.Continue on calorie count. Appreciate GI involvement. Tele-sitter at bedside. Pain management now with oral narcotics. Will require long-term admission. Palliative consulted for Monday to assist with pain management.  Lab recheck pending.  Consultants:  Infectious disease over the phone (Dr. Orvan Falconer).  GI Dr. Karilyn Cota  Procedures:  2D echo:  Fraction (55-60%), and no microscopic valve with moderate regurgitation and large vegetation. No wall motion normalities appreciated.  Antimicrobials:  Cefazolin 2/7->  Vancomycin 2/7-2/7  Subjective: Patient seen and evaluated today with no new acute complaints or concerns. No acute concerns or events noted overnight.  She had an argument with her boyfriend last night and boyfriend had stated that patient had some marijuana in her purse, but patient denies this and states that he was lying.  She continues to have severe pain and is refusing some blood draws.  Objective: Vitals:   02/17/18 0500 02/17/18 0547 02/17/18 2152 02/18/18 0502  BP:  116/85 116/77 129/88  Pulse:  (!) 130 (!) 124 (!) 133  Resp:  (!) 40 (!) 38   Temp:  98.7 F (37.1 C) 98.4 F (36.9 C) (!) 100.6 F (38.1 C)  TempSrc:  Oral Oral Oral  SpO2:  92% 96% 92%  Weight: 65.3 kg   64.9 kg  Height:        Intake/Output Summary (Last 24 hours) at 02/18/2018 0910 Last data filed at 02/17/2018 2117 Gross per 24 hour  Intake 243 ml  Output -  Net 243 ml   Filed Weights   02/16/18 0428 02/17/18 0500 02/18/18 0502  Weight: 65.4 kg 65.3 kg 64.9 kg    Examination:  General exam: Appears calm and comfortable    Respiratory system: Clear to auscultation. Respiratory effort normal. Cardiovascular system: S1 & S2 heard, RRR. No JVD, murmurs, rubs, gallops or clicks. No pedal edema. Gastrointestinal system: Abdomen is nondistended, soft and nontender. No organomegaly or masses felt. Normal bowel sounds heard. Central nervous system: Alert and oriented. No focal neurological deficits. Extremities: Symmetric 5 x 5 power. Skin: No rashes, lesions or ulcers Psychiatry: Appears anxious.    Data Reviewed: I have personally reviewed following labs and imaging studies  CBC: Recent Labs  Lab 02/11/18 1248 02/14/18 0420 02/15/18 0314  WBC 23.8* 27.3* 30.3*  HGB 8.1* 6.5* 9.9*  HCT 27.4* 22.2* 32.7*  MCV 83.5 86.0 84.1  PLT 115* 229 123*   Basic Metabolic Panel: Recent Labs  Lab 02/11/18 1248 02/14/18 0420 02/15/18 0314  NA 131* 132* 130*  K 4.8 5.7* 5.2*  CL 102 102 104  CO2 20* 22 17*  GLUCOSE 100* 109* 110*  BUN 11 13 13   CREATININE 0.62 0.74 0.59  CALCIUM 7.3* 7.6* 7.4*   GFR: Estimated Creatinine Clearance: 98.3 mL/min (by C-G formula based on SCr of 0.59 mg/dL). Liver Function Tests: Recent Labs  Lab 02/14/18 0420 02/15/18 0314  AST 63* 102*  ALT 27 30  ALKPHOS 95 101  BILITOT 0.4 0.6  PROT 5.8* 5.5*  ALBUMIN 1.4* 1.4*   No results for input(s): LIPASE, AMYLASE in the last 168 hours. No results for input(s): AMMONIA in the last 168 hours. Coagulation Profile: No results for input(s): INR, PROTIME in the last 168 hours. Cardiac Enzymes: No results for  input(s): CKTOTAL, CKMB, CKMBINDEX, TROPONINI in the last 168 hours. BNP (last 3 results) No results for input(s): PROBNP in the last 8760 hours. HbA1C: No results for input(s): HGBA1C in the last 72 hours. CBG: No results for input(s): GLUCAP in the last 168 hours. Lipid Profile: No results for input(s): CHOL, HDL, LDLCALC, TRIG, CHOLHDL, LDLDIRECT in the last 72 hours. Thyroid Function Tests: No results for  input(s): TSH, T4TOTAL, FREET4, T3FREE, THYROIDAB in the last 72 hours. Anemia Panel: No results for input(s): VITAMINB12, FOLATE, FERRITIN, TIBC, IRON, RETICCTPCT in the last 72 hours. Sepsis Labs: Recent Labs  Lab 02/17/18 1703  LATICACIDVEN 1.2    Recent Results (from the past 240 hour(s))  Blood Culture (routine x 2)     Status: Abnormal   Collection Time: 02/08/18  1:06 PM  Result Value Ref Range Status   Specimen Description   Final    BLOOD LEFT WRIST Performed at [facility], [address], [place], [place] [address]    Special Requests   Final    BOTTLES DRAWN AEROBIC ONLY Blood Culture results may not be optimal due to an inadequate volume of blood received in culture bottles Performed at [facility], [address], [place], [place] [address]    Culture  Setup Time   Final    AEROBIC BOTTLE ONLY GRAM POSITIVE COCCI Gram Stain Report Called to,Read Back By and Verified With: J DANIELS,RN @0228  02/09/18 MKELLY CRITICAL RESULT CALLED TO, READ BACK BY AND VERIFIED WITH: PHARMD L POOLE [id number] MLM Performed at [facility], [address], [place], [place] [address]    Culture STAPHYLOCOCCUS AUREUS (A)  Final   Report Status 02/11/2018 FINAL  Final   Organism ID, Bacteria STAPHYLOCOCCUS AUREUS  Final      Susceptibility   Staphylococcus aureus - MIC*    CIPROFLOXACIN <=0.5 SENSITIVE Sensitive     ERYTHROMYCIN >=8 RESISTANT Resistant     GENTAMICIN <=0.5 SENSITIVE Sensitive     OXACILLIN <=0.25 SENSITIVE Sensitive     TETRACYCLINE <=1 SENSITIVE Sensitive     VANCOMYCIN <=0.5 SENSITIVE Sensitive     TRIMETH/SULFA <=10 SENSITIVE Sensitive     CLINDAMYCIN RESISTANT Resistant     RIFAMPIN <=0.5 SENSITIVE Sensitive     Inducible Clindamycin POSITIVE Resistant     * STAPHYLOCOCCUS AUREUS  Blood Culture ID Panel (Reflexed)     Status: Abnormal   Collection Time: 02/08/18  1:06 PM  Result Value Ref Range Status   Enterococcus species NOT DETECTED NOT DETECTED  Final   Listeria monocytogenes NOT DETECTED NOT DETECTED Final   Staphylococcus species DETECTED (A) NOT DETECTED Final    Comment: CRITICAL RESULT CALLED TO, READ BACK BY AND VERIFIED WITH: PHARMD L POOLE [id number] MLM    Staphylococcus aureus (BCID) DETECTED (A) NOT DETECTED Final    Comment: Methicillin (oxacillin) susceptible Staphylococcus aureus (MSSA). Preferred therapy is anti staphylococcal beta lactam antibiotic (Cefazolin or Nafcillin), unless clinically contraindicated. CRITICAL RESULT CALLED TO, READ BACK BY AND VERIFIED WITH: PHARMD L POOLE [id number] MLM    Methicillin resistance NOT DETECTED NOT DETECTED Final   Streptococcus species NOT DETECTED NOT DETECTED Final   Streptococcus agalactiae NOT DETECTED NOT DETECTED Final   Streptococcus pneumoniae NOT DETECTED NOT DETECTED Final   Streptococcus pyogenes NOT DETECTED NOT DETECTED Final   Acinetobacter baumannii NOT DETECTED NOT DETECTED Final   Enterobacteriaceae species NOT DETECTED NOT DETECTED Final   Enterobacter cloacae complex NOT DETECTED NOT DETECTED Final   Escherichia  coli NOT DETECTED NOT DETECTED Final   Klebsiella oxytoca NOT DETECTED NOT DETECTED Final   Klebsiella pneumoniae NOT DETECTED NOT DETECTED Final   Proteus species NOT DETECTED NOT DETECTED Final   Serratia marcescens NOT DETECTED NOT DETECTED Final   Haemophilus influenzae NOT DETECTED NOT DETECTED Final   Neisseria meningitidis NOT DETECTED NOT DETECTED Final   Pseudomonas aeruginosa NOT DETECTED NOT DETECTED Final   Candida albicans NOT DETECTED NOT DETECTED Final   Candida glabrata NOT DETECTED NOT DETECTED Final   Candida krusei NOT DETECTED NOT DETECTED Final   Candida parapsilosis NOT DETECTED NOT DETECTED Final   Candida tropicalis NOT DETECTED NOT DETECTED Final    Comment: Performed at [facility], [address] [place], [place] [address]  Blood Culture (routine x 2)     Status: Abnormal   Collection Time: 02/08/18  1:13 PM    Result Value Ref Range Status   Specimen Description   Final    BLOOD LEFT ARM Performed at [facility], [address] [place], [place] [address]    Special Requests   Final    BOTTLES DRAWN AEROBIC AND ANAEROBIC Blood Culture adequate volume Performed at [facility], [address] [place], [place] [address]    Culture  Setup Time   Final    IN BOTH AEROBIC AND ANAEROBIC BOTTLES GRAM POSITIVE COCCI Gram Stain Report Called to,Read Back By and Verified With: J DANIELS,RN @0230  02/09/18 [facility] Performed at [facility], [address] [place], [place] [address]    Culture (A)  Final    STAPHYLOCOCCUS AUREUS SUSCEPTIBILITIES PERFORMED ON PREVIOUS CULTURE WITHIN THE LAST 5 DAYS. Performed at [facility], [address] [place], [place] [address]    Report Status 02/11/2018 FINAL  Final  Urine culture     Status: Abnormal   Collection Time: 02/08/18  1:19 PM  Result Value Ref Range Status   Specimen Description URINE, CLEAN CATCH  Final   Special Requests   Final    NONE Performed at [facility], [address] [place], [place] [address]    Culture >=100,000 COLONIES/mL STAPHYLOCOCCUS AUREUS (A)  Final   Report Status 02/10/2018 FINAL  Final   Organism ID, Bacteria STAPHYLOCOCCUS AUREUS (A)  Final      Susceptibility   Staphylococcus aureus - MIC*    CIPROFLOXACIN <=0.5 SENSITIVE Sensitive     GENTAMICIN <=0.5 SENSITIVE Sensitive     NITROFURANTOIN <=16 SENSITIVE Sensitive     OXACILLIN <=0.25 SENSITIVE Sensitive     TETRACYCLINE <=1 SENSITIVE Sensitive     VANCOMYCIN 1 SENSITIVE Sensitive     TRIMETH/SULFA <=10 SENSITIVE Sensitive     CLINDAMYCIN RESISTANT Resistant     RIFAMPIN <=0.5 SENSITIVE Sensitive     Inducible Clindamycin POSITIVE Resistant     * >=100,000 COLONIES/mL STAPHYLOCOCCUS AUREUS  MRSA PCR Screening     Status: None   Collection Time: 02/08/18  8:51 PM  Result Value Ref Range Status   MRSA by PCR NEGATIVE NEGATIVE Final    Comment:         The GeneXpert MRSA Assay (FDA approved for NASAL specimens only), is one component of a comprehensive MRSA colonization surveillance program. It is not intended to diagnose MRSA infection nor to guide or monitor treatment for MRSA infections. Performed at [facility], [address] [place], [place] [address]   Culture, blood (routine x 2)     Status: Abnormal   Collection Time: 02/11/18 12:59 PM  Result Value Ref Range Status   Specimen Description   Final    BLOOD LEFT HAND BOTTLES DRAWN AEROBIC AND ANAEROBIC Performed at [facility], [address] [place], [place] [address]    Special Requests   Final    Blood Culture adequate volume Performed at [facility], [address] [place], [place] [address]    Culture  Setup Time   Final    GRAM POSITIVE COCCI IN CLUSTERS RECOVERED FROM THE ANAEROBIC BOTTLE AND THE AEROBIC BOTTLE Gram Stain Report Called to,Read Back By and Verified With: MOTLEY,J. AT 1048 ON 02/12/2018 BY BAUGHAM,M. Performed at [facility] CRITICAL RESULT CALLED TO, READ BACK BY AND VERIFIED WITH: HURTH PHARMD AT 1402 ON [id number] BY SJW CRITICAL VALUE NOTED.  VALUE IS CONSISTENT WITH PREVIOUSLY REPORTED AND CALLED VALUE.    Culture (A)  Final    STAPHYLOCOCCUS AUREUS SUSCEPTIBILITIES PERFORMED ON PREVIOUS CULTURE WITHIN THE LAST 5 DAYS. Performed at [facility], [address], [place], [place] [address]    Report Status 02/14/2018 FINAL  Final  Culture, blood (routine x 2)     Status: None   Collection Time: 02/12/18 10:28 PM  Result Value Ref Range Status   Specimen Description BLOOD LEFT ARM  Final   Special Requests   Final    BOTTLES DRAWN AEROBIC AND ANAEROBIC Blood Culture adequate volume   Culture   Final    NO GROWTH 5 DAYS Performed at [facility], [address], [place], [place] [address]    Report Status 02/17/2018 FINAL  Final  Culture, blood (routine x 2)     Status: None   Collection Time: 02/12/18 10:30 PM  Result  Value Ref Range Status   Specimen Description BLOOD LEFT HAND  Final   Special Requests   Final    BOTTLES DRAWN AEROBIC ONLY Blood Culture adequate volume   Culture   Final    NO GROWTH 5 DAYS Performed at [facility], [address], [place], [place] [address]    Report Status 02/17/2018 FINAL  Final         Radiology Studies: No results found.      Scheduled Meds: . sodium chloride   Intravenous Once  . diclofenac  1 patch Transdermal BID  . HYDROmorphone  1 mg Oral Q3H  . metoprolol tartrate  25 mg Oral BID  . nicotine  14 mg Transdermal Daily  . pantoprazole  40 mg Oral BID  . sodium chloride flush  3 mL Intravenous Q12H   Continuous Infusions: .  ceFAZolin (ANCEF) IV 2 g (02/18/18 1308)     LOS: 10 days    Time spent: 30 minutes    Norvel Wenker Hoover Brunette, DO [facility] Pager [phone]  If 7PM-7AM, please contact night-coverage [email or web address] Password [email or web address] 02/18/2018, 9:10 AM

## 2018-02-19 MED ORDER — OXYCODONE HCL 5 MG PO TABS
15.0000 mg | ORAL_TABLET | Freq: Two times a day (BID) | ORAL | Status: DC | PRN
  Administered 2018-02-19 – 2018-02-20 (×3): 15 mg via ORAL
  Filled 2018-02-19 (×3): qty 3

## 2018-02-19 MED ORDER — ADULT MULTIVITAMIN W/MINERALS CH
1.0000 | ORAL_TABLET | Freq: Every day | ORAL | Status: DC
  Administered 2018-02-20 – 2018-03-26 (×18): 1 via ORAL
  Filled 2018-02-19 (×33): qty 1

## 2018-02-19 MED ORDER — HYDROMORPHONE HCL 2 MG PO TABS
2.0000 mg | ORAL_TABLET | ORAL | Status: DC
  Administered 2018-02-19 – 2018-02-20 (×8): 2 mg via ORAL
  Filled 2018-02-19 (×8): qty 1

## 2018-02-19 MED ORDER — SODIUM CHLORIDE 0.9 % IV SOLN
INTRAVENOUS | Status: AC
  Administered 2018-02-19: 12:00:00 via INTRAVENOUS

## 2018-02-19 NOTE — Progress Notes (Signed)
Tele called and has been in 130's.  Texted Dr. Sherryll Burger

## 2018-02-19 NOTE — Progress Notes (Addendum)
Initial Nutrition Assessment  DOCUMENTATION CODES:   Severe malnutrition in context of acute illness/injury  INTERVENTION:  72-Calorie count completed- details below  Trial Magic Cup BID  MVI daily  NUTRITION DIAGNOSIS:   Severe Malnutrition related to acute illness(MSSA bacteremia, Pneumonia, endocarditis.) as evidenced by energy intake < or equal to 50% for > or equal to 5 days, 17% wt gain (BLE-(moderate to severe) edema).    GOAL:   Patient will meet greater than or equal to 90% of their needs MONITOR:   PO intake, Weight trends, Labs, I & O's REASON FOR ASSESSMENT:  Consult (Calorie Count)  ASSESSMENT: Patient is a 23 yo female with a history of substance abuse, severe anemia (received 2 units PRBC's), MSSA bacteremia, Pneumonia, endocarditis. Bi-lateral pedal edema. Severe weight gain since admission-17% increase in wt or gain of 9.9 kg.   Meal intake: 0-50% since admission. 72-hr calorie count completed. Data recorded and evaluated for 7 meals. Patient likes fruit and juice but has consumed very few calories (energy or protein) compared to needs.  Diet: Regular  Supplements: none- patient refuses offer of any supplements or milk with meals- replies, "I don't have a taste for anything." Explained to patient that her body requires nutrition whether she "feels like" eating or not. RD unable to identify any food or beverage that pt would commit to eat or drink.  Breakfast: 425 kcal, 6 gr protein, 10 gr fat  Lunch: 455 kcal, 7 gr protein, 8 gr fat  Dinner: 410 kcal, 9 gr protein, 12 gr fat  Supplements: 0  Total intake: 1290 kcal -averaged over 3 days =430 kcal/day 26% of minimum estimated needs.   22 protein-averaged over 3 days= 7.3 gr protein/day ~1% of minimum estimated needs  Labs: BMP Latest Ref Rng & Units 02/18/2018 02/15/2018 02/14/2018  Glucose 70 - 99 mg/dL 945(O) 592(T) 244(Q)  BUN 6 - 20 mg/dL 8 13 13   Creatinine 0.44 - 1.00 mg/dL 2.86 3.81 7.71   Sodium 135 - 145 mmol/L 130(L) 130(L) 132(L)  Potassium 3.5 - 5.1 mmol/L 4.7 5.2(H) 5.7(H)  Chloride 98 - 111 mmol/L 103 104 102  CO2 22 - 32 mmol/L 18(L) 17(L) 22  Calcium 8.9 - 10.3 mg/dL 7.4(L) 7.4(L) 7.6(L)      NUTRITION - FOCUSED PHYSICAL EXAM:   Diet Order:   Diet Order            Diet regular Room service appropriate? Yes; Fluid consistency: Thin  Diet effective now              EDUCATION NEEDS:   Not appropriate for education at this time Skin:  Skin Assessment: Reviewed RN Assessment  Last BM:  2/16  Height:   Ht Readings from Last 1 Encounters:  02/08/18 5\' 3"  (1.6 m)    Weight:   Wt Readings from Last 1 Encounters:  02/18/18 64.9 kg    Ideal Body Weight:  52 kg  BMI:  Body mass index is 25.35 kg/m.  Estimated Nutritional Needs:   Kcal:  1650-1815 (30-33 kcal/kg/bw)(Based on admission wt of 55 kg. Severe wt gain (20 lb) since admission.)  Protein:  88-99 (1.6-1.8 gr/kg/bw)  Fluid:  per MD goals   Royann Shivers MS,RD,CSG,LDN Office: [phone] Pager: [phone]

## 2018-02-19 NOTE — Progress Notes (Signed)
PMT consult received and chart reviewed.    Discussed with PMT medical director during team rounds and attending, Dr. Sherryll Burger.  PMT manages pain and symptoms related to cancer and terminal diagnoses. Unfortunately PMT does not have a role in chronic addiction/pain related to addiction management. Patient with polysubstance abuse and acute pain due to paravertebral myositis. Recommend continued oral regimen. Recommend substance abuse clinic/suboxone clinic (which per attending, patient has previously refused).   Please re-consult PMT if we can further assist in the care of Amy Mcknight. Thank you.   20 minutes: Non face-to-face physician charge  Vennie Homans, DNP, FNP-C Palliative Medicine Team  Phone: [phone] Fax: [phone]

## 2018-02-19 NOTE — Progress Notes (Signed)
PROGRESS NOTE    Amy Mcknight  ZOX:[id number] DOB: 1995/12/20 DOA: 02/08/2018 PCP: Patient, No Pcp Per   Brief Narrative:  23 y.o.femalewith medical history significant forsubstance use disorderwho presents to the ED with 2 days of right lower back pain. Patient states symptoms began with acute onset while at rest. Symptoms have been persistent. She has noted associated diaphoresis, chills, shortness of breath, cough productive of brown sputum, and right chest wall pain. She denies any rashes or obvious skin changes. She reports good urine output without dysuria. She denies any abdominal pain, diarrhea, or constipation.  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 She continues to use recreational drugs by snorting nasally. She denies any alcohol use. She reports a history of withdrawal from opiates in the past.And is noted to be positive for marijuana and opiates on her UDS.  She is noted to be anemicand is status post 2 unit PRBC transfusion with improvement noted. No overt bleeding currently identified, but patient is complaining of some epigastric abdominal pain. As result of this pain, she has not been eating very much. She denies any nausea or vomiting.GI has evaluated patient with no need for endoscopy noted at this time.  She continues to have ongoing pain with calorie count in progress.    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 -Pneumonia lesions could also represent septic emboli -Case discussed with  infectious disease doctor who has recommended continue supportive care and continue IV Ancef -Continue to follow culture results and maintain on peripheral IV for nowwithsupervised treatment. -At this moment plan is for 6 weeks of supervised IV treatment. -2D echo positive for tricuspid vegetation and moderate regurgitation. -Discussed care with palliative medicine who agrees that patient is being managed appropriately.  Continue oral Dilaudid and space out medications more due to somnolence as well as decreased Dilaudid dose to 2 mg at this point.  2-AKI (acute kidney injury) (HCC)-resolved -Continue close monitoringif patient will allow lab work, DC IV fluid  3-Substance use disorder/opiate withdrawal syndrome -Extensive cessation counseling has been provided.  -Patient was receptive, but has not really expressed future decisions about quitting recreational drugs.. -Continue adjusted dose of narcotics. -Patient expressing pain all over especially in her back.  4-tobacco abuse -Extensive cessation counseling provided. -nicotine patch offered.   5-Hyponatremia -Restart time-limited IV normal saline.  6-positive hepatitis C antibody -Quantitative viral load test undetectable; suggesting that the patient was able to clear infection on her own -No treatment needed  -Patient educated about how these diseases transmitted and counseling provided regarding safe sex practices and to stop the use of IV drugs.  7-bilateral pedal edema -Apply TED hoses -Adjust IV fluid rates.  8-severe anemia-suspecthemolysis in the setting of acute infection-stable -Anemia panelrevealed some iron deficiency and stool occult is negative -2 unit PRBC orderedon 2/12 with stable hemoglobin levels -Appreciate GI evaluation with no recommendations for endoscopy at this time -PPI IV twice dailychanged to p.o. -Feraheme given several days ago due to iron deficiency -Diet has been advanced with  calorie  count in progress   DVT prophylaxis:SCDs Code Status:Full code Family Communication:No family at bedside. Disposition Plan:Remains inpatient, continue IV Ancef. Follow repeated blood culturesandmaintain on peripheral IV due to drug paraphernalia that was found in the room.Continue on calorie count. Tele-sitter at bedside. Pain managementnow with oral narcotics. Will require long-term admission.Lab recheck in a.m.  Consultants:  Infectious disease over the phone (Dr. Orvan Falconerampbell).  GI Dr. Karilyn Cotaehman  Palliative care over phone  Procedures:  2D echo:  Fraction (55-60%), and no microscopic valve with moderate regurgitation and large vegetation. No wall motion normalities appreciated.  Antimicrobials:  Cefazolin 2/7->  Vancomycin 2/7-2/7   Subjective: Patient seen and evaluated today with no new acute complaints or concerns. No acute concerns or events noted overnight.  She appears to be much more somnolent this morning.  Objective: Vitals:   02/17/18 2152 02/18/18 0502 02/18/18 2132 02/19/18 0500  BP: 116/77 129/88 132/70 136/72  Pulse: (!) 124 (!) 133 (!) 135 (!) 134  Resp: (!) 38   16  Temp: 98.4 F (36.9 C) (!) 100.6 F (38.1 C) 98.7 F (37.1 C) 98.6 F (37 C)  TempSrc: Oral Oral Oral Oral  SpO2: 96% 92% 95% 98%  Weight:  64.9 kg    Height:        Intake/Output Summary (Last 24 hours) at 02/19/2018 1015 Last data filed at 02/19/2018 0500 Gross per 24 hour  Intake 3 ml  Output 375 ml  Net -372 ml   Filed Weights   02/16/18 0428 02/17/18 0500 02/18/18 0502  Weight: 65.4 kg 65.3 kg 64.9 kg    Examination:  General exam: Appears calm and comfortable  Respiratory system: Clear to auscultation. Respiratory effort normal. Cardiovascular system: S1 & S2 heard, RRR. No JVD, murmurs, rubs, gallops or clicks. No pedal edema. Gastrointestinal system: Abdomen is nondistended, soft and nontender. No organomegaly or masses felt. Normal bowel  sounds heard. Central nervous system: Alert and oriented. No focal neurological deficits. Extremities: Symmetric 5 x 5 power. Skin: No rashes, lesions or ulcers Psychiatry: Judgement and insight appear normal. Mood & affect appropriate.     Data Reviewed: I have personally reviewed following labs and imaging studies  CBC: Recent Labs  Lab 02/14/18 0420 02/15/18 0314 02/18/18 0940  WBC 27.3* 30.3* 21.9*  HGB 6.5* 9.9* 10.0*  HCT 22.2* 32.7* 33.6*  MCV 86.0 84.1 87.7  PLT 229 123* 123*   Basic Metabolic Panel: Recent Labs  Lab 02/14/18 0420 02/15/18 0314 02/18/18 0940  NA 132* 130* 130*  K 5.7* 5.2* 4.7  CL 102 104 103  CO2 22 17* 18*  GLUCOSE 109* 110* 103*  BUN 13 13 8   CREATININE 0.74 0.59 0.63  CALCIUM 7.6* 7.4* 7.4*  MG  --   --  1.7   GFR: Estimated Creatinine Clearance: 98.3 mL/min (by C-G formula based on SCr of 0.63 mg/dL). Liver Function Tests: Recent Labs  Lab 02/14/18 0420 02/15/18 0314 02/18/18 0940  AST 63* 102* 70*  ALT 27 30 21   ALKPHOS 95 101 100  BILITOT 0.4 0.6 0.7  PROT 5.8* 5.5* 6.0*  ALBUMIN 1.4* 1.4* 1.4*   No results for input(s): LIPASE, AMYLASE in the last 168 hours. No results for input(s): AMMONIA in the last 168 hours. Coagulation Profile: No results for input(s): INR, PROTIME in the last 168 hours. Cardiac Enzymes: No results for input(s): CKTOTAL, CKMB, CKMBINDEX, TROPONINI in the last 168 hours. BNP (last 3 results) No results for input(s): PROBNP in the last 8760 hours. HbA1C:  No results for input(s): HGBA1C in the last 72 hours. CBG: No results for input(s): GLUCAP in the last 168 hours. Lipid Profile: No results for input(s): CHOL, HDL, LDLCALC, TRIG, CHOLHDL, LDLDIRECT in the last 72 hours. Thyroid Function Tests: No results for input(s): TSH, T4TOTAL, FREET4, T3FREE, THYROIDAB in the last 72 hours. Anemia Panel: No results for input(s): VITAMINB12, FOLATE, FERRITIN, TIBC, IRON, RETICCTPCT in the last 72  hours. Sepsis Labs: Recent Labs  Lab 02/17/18 1703  LATICACIDVEN 1.2    Recent Results (from the past 240 hour(s))  Culture, blood (routine x 2)     Status: Abnormal   Collection Time: 02/11/18 12:59 PM  Result Value Ref Range Status   Specimen Description   Final    BLOOD LEFT HAND BOTTLES DRAWN AEROBIC AND ANAEROBIC Performed at [facility], [address] [place], [place] [address]    Special Requests   Final    Blood Culture adequate volume Performed at [facility], [address] [place], [place] [address]    Culture  Setup Time   Final    GRAM POSITIVE COCCI IN CLUSTERS RECOVERED FROM THE ANAEROBIC BOTTLE AND THE AEROBIC BOTTLE Gram Stain Report Called to,Read Back By and Verified With: MOTLEY,J. AT 1048 ON 02/12/2018 BY BAUGHAM,M. Performed at [facility] CRITICAL RESULT CALLED TO, READ BACK BY AND VERIFIED WITH: HURTH PHARMD AT 1402 ON [id number] BY SJ[facility] CRITICAL VALUE NOTED.  VALUE IS CONSISTENT WITH PREVIOUSLY REPORTED AND CALLED VALUE.    Culture (A)  Final    STAPHYLOCOCCUS AUREUS SUSCEPTIBILITIES PERFORMED ON PREVIOUS CULTURE WITHIN THE LAST 5 DAYS. Performed at [facility], [address], [place], [place] [address]    Report Status 02/14/2018 FINAL  Final  Culture, blood (routine x 2)     Status: None   Collection Time: 02/12/18 10:28 PM  Result Value Ref Range Status   Specimen Description BLOOD LEFT ARM  Final   Special Requests   Final    BOTTLES DRAWN AEROBIC AND ANAEROBIC Blood Culture adequate volume   Culture   Final    NO GROWTH 5 DAYS Performed at [facility], [address], [place], [place] [address]    Report Status 02/17/2018 FINAL  Final  Culture, blood (routine x 2)     Status: None   Collection Time: 02/12/18 10:30 PM  Result Value Ref Range Status   Specimen Description BLOOD LEFT HAND  Final   Special Requests   Final    BOTTLES DRAWN AEROBIC ONLY Blood Culture adequate volume   Culture   Final    NO GROWTH 5  DAYS Performed at [facility], [address] [place], [place] [address]    Report Status 02/17/2018 FINAL  Final         Radiology Studies: No results found.      Scheduled Meds: . sodium chloride   Intravenous Once  . diclofenac  1 patch Transdermal BID  . HYDROmorphone  2 mg Oral Q3H  . metoprolol tartrate  25 mg Oral BID  . nicotine  14 mg Transdermal Daily  . pantoprazole  40 mg Oral BID  . sodium chloride flush  3 mL Intravenous Q12H   Continuous Infusions: .  ceFAZolin (ANCEF) IV 2 g (02/19/18 0520)     LOS: 11 days    Time spent: 30 minutes    Damontae Loppnow Hoover Brunette, DO [facility] Pager [phone]  If 7PM-7AM, please contact night-coverage [email or web address] Password [facility] 02/19/2018, 10:15 AM

## 2018-02-20 ENCOUNTER — Inpatient Hospital Stay (HOSPITAL_COMMUNITY): Payer: Self-pay

## 2018-02-20 LAB — COMPREHENSIVE METABOLIC PANEL
ALT: 17 U/L (ref 0–44)
AST: 50 U/L — ABNORMAL HIGH (ref 15–41)
Albumin: 1.4 g/dL — ABNORMAL LOW (ref 3.5–5.0)
Alkaline Phosphatase: 83 U/L (ref 38–126)
Anion gap: 8 (ref 5–15)
BUN: <5 mg/dL — ABNORMAL LOW (ref 6–20)
CO2: 23 mmol/L (ref 22–32)
Calcium: 7.4 mg/dL — ABNORMAL LOW (ref 8.9–10.3)
Chloride: 103 mmol/L (ref 98–111)
Creatinine, Ser: 0.44 mg/dL (ref 0.44–1.00)
GFR calc Af Amer: >60 mL/min (ref >60)
GFR calc non Af Amer: >60 mL/min (ref >60)
Glucose, Bld: 99 mg/dL (ref 70–99)
Potassium: 4.2 mmol/L (ref 3.5–5.1)
Sodium: 134 mmol/L — ABNORMAL LOW (ref 135–145)
Total Bilirubin: 0.3 mg/dL (ref 0.3–1.2)
Total Protein: 5.5 g/dL — ABNORMAL LOW (ref 6.5–8.1)

## 2018-02-20 LAB — CBC
HCT: 27.5 % — ABNORMAL LOW (ref 36.0–46.0)
Hemoglobin: 8.3 g/dL — ABNORMAL LOW (ref 12.0–15.0)
MCH: 25.9 pg — ABNORMAL LOW (ref 26.0–34.0)
MCHC: 30.2 g/dL (ref 30.0–36.0)
MCV: 85.9 fL (ref 80.0–100.0)
Platelets: 151 10*3/uL (ref 150–400)
RBC: 3.2 MIL/uL — ABNORMAL LOW (ref 3.87–5.11)
RDW: 20.4 % — AB (ref 11.5–15.5)
WBC: 13.8 10*3/uL — ABNORMAL HIGH (ref 4.0–10.5)
nRBC: 0 % (ref 0.0–0.2)

## 2018-02-20 MED ORDER — MIRTAZAPINE 15 MG PO TABS
7.5000 mg | ORAL_TABLET | Freq: Every day | ORAL | Status: DC
  Administered 2018-02-21: 7.5 mg via ORAL
  Filled 2018-02-20: qty 1

## 2018-02-20 MED ORDER — OXYCODONE HCL 5 MG PO TABS
15.0000 mg | ORAL_TABLET | Freq: Four times a day (QID) | ORAL | Status: DC | PRN
  Administered 2018-02-20 – 2018-02-21 (×4): 15 mg via ORAL
  Filled 2018-02-20 (×4): qty 3

## 2018-02-20 MED ORDER — HYDROMORPHONE HCL 2 MG PO TABS
1.0000 mg | ORAL_TABLET | ORAL | Status: DC
  Administered 2018-02-20 – 2018-02-21 (×10): 1 mg via ORAL
  Filled 2018-02-20 (×9): qty 1

## 2018-02-20 NOTE — Evaluation (Signed)
Physical Therapy Evaluation Patient Details Name: Amy Mcknight Wakeley MRN: [id number] DOB: 1995/04/30 Today's Date: 02/20/2018   History of Present Illness  Amy Mcknight Harton is a 23 y.o. female with medical history significant for substance use disorder who presents to the ED with 2 days of right lower back pain.  Patient states symptoms began with acute onset while at rest.  Symptoms have been persistent.  She has noted associated diaphoresis, chills, shortness of breath, cough productive of brown sputum, and right chest wall pain.  She denies any rashes or obvious skin changes.  She reports good urine output without dysuria.  She denies any abdominal pain, diarrhea, or constipation. 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  She continues to use recreational drugs by snorting nasally.  She denies any alcohol use.  She reports a history of withdrawal from opiates in the past.    Clinical Impression  Patient below baseline for functional mobility and gait, limited secondary to generalized weakness and fatigue with activity. Pt agitated and willing to participate with much encouragement and education on benefits of skilled PT. Pt requires mod assistance with bed mobility and min assist with transfers due to generalized weakness and pain in back and R LE. Pt hesitant to stand, but agreeable with encouragement. Pt able to take 5-6 slow, unsteady steps at bedside requiring min assist for steadying assistance and verbal cues for RW management. Pt limited secondary to mild shortness of breath and pain complaints. Pt reports equal sensation throughout B LE and with equal coordination, no foot drop noted, and with B LE swelling noted. Pt left up in chair with nursing in room and call bell in hand. Patient will benefit from continued physical therapy in hospital and recommendations below to increase strength, balance, endurance for safe ADLs and gait.     Follow Up Recommendations Home health PT;Supervision -  Intermittent;Supervision for mobility/OOB    Equipment Recommendations  Rolling walker with 5" wheels    Recommendations for Other Services       Precautions / Restrictions Precautions Precautions: Fall Restrictions Weight Bearing Restrictions: No      Mobility  Bed Mobility Overal bed mobility: Needs Assistance Bed Mobility: Supine to Sit     Supine to sit: Mod assist     General bed mobility comments: increased time, mod assist for B LE management and trunk uprighting, use of bed rail  Transfers Overall transfer level: Needs assistance Equipment used: Rolling walker (2 wheeled) Transfers: Sit to/from UGI CorporationStand;Stand Pivot Transfers Sit to Stand: Min assist Stand pivot transfers: Min assist       General transfer comment: increased time, verbal cues for hand placement, elevated bed  Ambulation/Gait Ambulation/Gait assistance: Min assist Gait Distance (Feet): 5 Feet Assistive device: Rolling walker (2 wheeled) Gait Pattern/deviations: Decreased step length - right;Decreased step length - left;Decreased stride length Gait velocity: decreased   General Gait Details: slow, unsteady 5-6 steps at bedside, verbal cues for RW management, steadying assistance, no foot drop noted, limited secondary to R LE and back pain, mild shortness of breath noted, no loss of balance  Stairs            Wheelchair Mobility    Modified Rankin (Stroke Patients Only)       Balance Overall balance assessment: Needs assistance Sitting-balance support: Feet supported;No upper extremity supported Sitting balance-Leahy Scale: Fair Sitting balance - Comments: seated edge of bed     Standing balance-Leahy Scale: Poor Standing balance comment: fair/poor with RW  Pertinent Vitals/Pain Pain Assessment: 0-10 Pain Score: 10-Worst pain ever Pain Location: low back and R LE Pain Descriptors / Indicators: Constant;Other (Comment)("it just  hurts") Pain Intervention(s): Limited activity within patient's tolerance;Monitored during session;Repositioned    Home Living Family/patient expects to be discharged to:: Private residence Living Arrangements: Parent Available Help at Discharge: Family Type of Home: House Home Access: Level entry     Home Layout: One level Home Equipment: None      Prior Function Level of Independence: Independent         Comments: Pt reports about 2 weeks ago her back pain became so significant that she began limping and required hand held assist to walk     Hand Dominance        Extremity/Trunk Assessment   Upper Extremity Assessment Upper Extremity Assessment: Overall WFL for tasks assessed    Lower Extremity Assessment Lower Extremity Assessment: Generalized weakness;RLE deficits/detail;LLE deficits/detail(Pt reports equal bil LE sensation to light touch, with equal bil coordination) RLE Deficits / Details: knee extension 2+/5, hip flexion, 2+/5 hip adduction/abduction 3/5, ankle 4/5 per MMT RLE Sensation: WNL RLE Coordination: WNL LLE Deficits / Details: grossly 4/5 LLE Sensation: WNL LLE Coordination: WNL    Cervical / Trunk Assessment Cervical / Trunk Assessment: Normal  Communication   Communication: No difficulties  Cognition Arousal/Alertness: Awake/alert Behavior During Therapy: WFL for tasks assessed/performed;Agitated Overall Cognitive Status: Within Functional Limits for tasks assessed                                 General Comments: Pt agitated thorughout treatment session, required constant education on benefits of physical therapy and encouragement to participate      General Comments      Exercises     Assessment/Plan    PT Assessment Patient needs continued PT services  PT Problem List Decreased strength;Decreased activity tolerance;Decreased balance;Decreased mobility       PT Treatment Interventions Gait training;Functional  mobility training;Therapeutic activities;Therapeutic exercise;Balance training;Patient/family education    PT Goals (Current goals can be found in the Care Plan section)  Acute Rehab PT Goals Patient Stated Goal: return home PT Goal Formulation: With patient Time For Goal Achievement: 02/27/18 Potential to Achieve Goals: Good    Frequency Min 2X/week   Barriers to discharge        Co-evaluation               AM-PAC PT "6 Clicks" Mobility  Outcome Measure Help needed turning from your back to your side while in a flat bed without using bedrails?: A Lot Help needed moving from lying on your back to sitting on the side of a flat bed without using bedrails?: A Lot Help needed moving to and from a bed to a chair (including a wheelchair)?: A Little Help needed standing up from a chair using your arms (e.g., wheelchair or bedside chair)?: A Little Help needed to walk in hospital room?: A Little Help needed climbing 3-5 steps with a railing? : A Lot 6 Click Score: 15    End of Session Equipment Utilized During Treatment: Gait belt Activity Tolerance: Patient tolerated treatment well;Patient limited by pain;Patient limited by fatigue Patient left: in chair;with call bell/phone within reach;with nursing/sitter in room Nurse Communication: Mobility status PT Visit Diagnosis: Unsteadiness on feet (R26.81);Other abnormalities of gait and mobility (R26.89);Muscle weakness (generalized) (M62.81)    Time: 1005-1035 PT Time Calculation (min) (ACUTE ONLY): 30 min  Charges:   PT Evaluation $PT Eval Moderate Complexity: 1 Mod PT Treatments $Gait Training: 8-22 mins $Therapeutic Activity: 8-22 mins       12:43 PM, 02/20/18 Domenick Bookbinder, DPT Physical Therapist with [facility] [phone] office

## 2018-02-20 NOTE — Progress Notes (Addendum)
Patient has a temp of 101.3, MD aware, giving PRN Tylenol .

## 2018-02-20 NOTE — Plan of Care (Signed)
  Problem: Acute Rehab PT Goals(only PT should resolve) Goal: Pt Will Go Supine/Side To Sit Outcome: Progressing Flowsheets (Taken 02/20/2018 1244) Pt will go Supine/Side to Sit: with min guard assist Goal: Patient Will Transfer Sit To/From Stand Outcome: Progressing Flowsheets (Taken 02/20/2018 1244) Patient will transfer sit to/from stand: with min guard assist Goal: Pt Will Transfer Bed To Chair/Chair To Bed Outcome: Progressing Flowsheets (Taken 02/20/2018 1244) Pt will Transfer Bed to Chair/Chair to Bed: min guard assist Goal: Pt Will Ambulate Outcome: Progressing Flowsheets (Taken 02/20/2018 1244) Pt will Ambulate: 50 feet; with min guard assist; with least restrictive assistive device   12:44 PM, 02/20/18 Domenick Bookbinder, DPT Physical Therapist with [facility] [phone] office

## 2018-02-20 NOTE — Progress Notes (Signed)
PROGRESS NOTE    Amy Mcknight  ZOX:[id number] DOB: 1995-09-22 DOA: 02/08/2018 PCP: Patient, No Pcp Per   Brief Narrative:  23 y.o.femalewith medical history significant forsubstance use disorderwho presents to the ED with 2 days of right lower back pain. Patient states symptoms began with acute onset while at rest. Symptoms have been persistent. She has noted associated diaphoresis, chills, shortness of breath, cough productive of brown sputum, and right chest wall pain. She denies any rashes or obvious skin changes. She reports good urine output without dysuria. She denies any abdominal pain, diarrhea, or constipation.  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 She continues to use recreational drugs by snorting nasally. She denies any alcohol use. She reports a history of withdrawal from opiates in the past.And is noted to be positive for marijuana and opiates on her UDS.  She is noted to be anemicand is status post 2 unit PRBC transfusion with improvement noted. No overt bleeding currently identified, but patient is complaining of some epigastric abdominal pain. As result of this pain, she has not been eating very much. She denies any nausea or vomiting.GI has evaluated patient with no need for endoscopy noted at this time.  Patient continues to complain of ongoing pain and refuses to get out of the bed and is very difficult as she refuses bathing and laboratory draws.  She has been using a bedpan and has not been eating very well.  She has been more somnolent this morning.   Assessment & Plan:   Principal Problem:   MSSA bacteremia Active Problems:   Cavitary pneumonia   AKI (acute kidney injury) (HCC)   Substance use disorder   Hyponatremia   IVDU (intravenous drug user)   Myositis   Normocytic anemia   Thrombocytopenia (HCC)   Cigarette smoker   Hepatitis C antibody test positive   Endocarditis of tricuspid valve  1-sepsis with early  septic shock in the setting of MSSA bacteremia, tricuspid vegetation, paravertebral myositis and cavitary pneumonia. -Pneumonia lesions could also represent septic emboli -Case discussed with infectious disease doctor who has recommended continue supportive care and continue IV Ancef -Continue to follow culture results and maintain on peripheral IV for nowwithsupervised treatment. -At this moment plan is for 6 weeks of supervised IV treatment. -2D echo positive for tricuspid vegetation and moderate regurgitation. -Discussed care with palliative medicine who agrees that patient is being managed appropriately.  Continue oral Dilaudid and space out medications more due to somnolence as well as decreased Dilaudid dose to 1 mg every 3 hours.  Continue to wean as tolerated.  2-AKI (acute kidney injury) (HCC)-resolved -Continue close monitoring with labs every other day -IV fluid discontinued  3-Substance use disorder/opiate withdrawal syndrome -Extensive cessation counseling has been provided.  -Patient was receptive, but has not really expressed future decisions about quitting recreational drugs.. -Continue adjusted dose of narcotics. -Patient expressing pain all over especially in her back.  4-tobacco abuse -Extensive cessation counseling provided. -nicotine patch offered.   5-Hyponatremia-resolved -Follow-up repeat BMP every other day  6-positive hepatitis C antibody -Quantitative viral load test undetectable; suggesting that the patient was able to clear infection on her own -No treatment needed  -Patient educated about how these diseases transmitted and counseling provided regarding safe sex practices and to stop the use of IV drugs.  7-bilateral pedal edema -Apply TED hoses, but patient refuses to wear  8-severe anemia-suspecthemolysis in the setting of acute infection-stable -Anemia panelrevealed some iron deficiency and stool occult  is negative -2 unit PRBC orderedon  2/12 with stable hemoglobin levels noted on repeat CBC -Appreciate GI evaluation with no recommendations for endoscopy at this time -PPI IV twice dailychanged to p.o. -Feraheme given several days ago due to iron deficiency -Diet as ordered with supplementation  9-lethargy/depression -PT evaluation -Start mirtazapine today to assist with depression symptoms as well as appetite  10-ongoing sinus tachycardia -Continue to monitor as this has been persistent for her.  Does not appear to be related to anything other than the primary concern noted above.  She does not appear to be volume depleted, nor does she have chest pain or respiratory distress.   DVT prophylaxis:SCDs Code Status:Full code Family Communication:No family at bedside. Disposition Plan:Remains inpatient, continue IV Ancef for total 6 weeks as patient is not a candidate for PICC line placement or transfer to another facility based on IV drug use. Tele-sitter at bedside. Pain managementnow with oral narcotics which has been weaned to 1 mg of Dilaudid today. Will require long-term admission.Lab recheck on 2/20 as she may have rechecks every other day at this point.  We will plan to start mirtazapine for appetite and control of depression symptoms.  PT evaluation to start today.  Consultants:  Infectious disease over the phone (Dr. Orvan Falconer).  GI Dr. Karilyn Cota  Palliative care over phone  Procedures:  2D echo:  Fraction (55-60%), and no microscopic valve with moderate regurgitation and large vegetation. No wall motion normalities appreciated.  Antimicrobials:  Cefazolin 2/7->  Vancomycin 2/7-2/7  Subjective: Patient seen and evaluated today with no new acute complaints or concerns. No acute concerns or events noted overnight.  She appears to be somnolent and lethargic.  States pain is controlled.  She does not get up out of the bed and uses a bedpan for toileting.  She does eat approximately quarter to  half of her meals.  Objective: Vitals:   02/20/18 0500 02/20/18 0608 02/20/18 0623 02/20/18 0949  BP:  (!) 134/98    Pulse:  (!) 126    Resp:  17    Temp:  99.5 F (37.5 C)    TempSrc:  Oral    SpO2:  (!) 88% 94% 96%  Weight: 66.3 kg     Height:        Intake/Output Summary (Last 24 hours) at 02/20/2018 1026 Last data filed at 02/20/2018 0306 Gross per 24 hour  Intake 455.9 ml  Output 600 ml  Net -144.1 ml   Filed Weights   02/17/18 0500 02/18/18 0502 02/20/18 0500  Weight: 65.3 kg 64.9 kg 66.3 kg    Examination:  General exam: Appears calm and comfortable, somnolent Respiratory system: Clear to auscultation. Respiratory effort normal. Cardiovascular system: S1 & S2 heard, RRR. No JVD, murmurs, rubs, gallops or clicks.  Scant pedal edema. Gastrointestinal system: Abdomen is nondistended, soft and nontender. No organomegaly or masses felt. Normal bowel sounds heard. Central nervous system: Alert and oriented. No focal neurological deficits. Extremities: Symmetric 5 x 5 power. Skin: No rashes, lesions or ulcers Psychiatry: Appears depressed.    Data Reviewed: I have personally reviewed following labs and imaging studies  CBC: Recent Labs  Lab 02/14/18 0420 02/15/18 0314 02/18/18 0940 02/20/18 0932  WBC 27.3* 30.3* 21.9* 13.8*  HGB 6.5* 9.9* 10.0* 8.3*  HCT 22.2* 32.7* 33.6* 27.5*  MCV 86.0 84.1 87.7 85.9  PLT 229 123* 123* 151   Basic Metabolic Panel: Recent Labs  Lab 02/14/18 0420 02/15/18 0314 02/18/18 0940  NA 132* 130* 130*  K 5.7* 5.2* 4.7  CL 102 104 103  CO2 22 17* 18*  GLUCOSE 109* 110* 103*  BUN 13 13 8   CREATININE 0.74 0.59 0.63  CALCIUM 7.6* 7.4* 7.4*  MG  --   --  1.7   GFR: Estimated Creatinine Clearance: 99.3 mL/min (by C-G formula based on SCr of 0.63 mg/dL). Liver Function Tests: Recent Labs  Lab 02/14/18 0420 02/15/18 0314 02/18/18 0940  AST 63* 102* 70*  ALT 27 30 21   ALKPHOS 95 101 100  BILITOT 0.4 0.6 0.7  PROT 5.8*  5.5* 6.0*  ALBUMIN 1.4* 1.4* 1.4*   No results for input(s): LIPASE, AMYLASE in the last 168 hours. No results for input(s): AMMONIA in the last 168 hours. Coagulation Profile: No results for input(s): INR, PROTIME in the last 168 hours. Cardiac Enzymes: No results for input(s): CKTOTAL, CKMB, CKMBINDEX, TROPONINI in the last 168 hours. BNP (last 3 results) No results for input(s): PROBNP in the last 8760 hours. HbA1C: No results for input(s): HGBA1C in the last 72 hours. CBG: No results for input(s): GLUCAP in the last 168 hours. Lipid Profile: No results for input(s): CHOL, HDL, LDLCALC, TRIG, CHOLHDL, LDLDIRECT in the last 72 hours. Thyroid Function Tests: No results for input(s): TSH, T4TOTAL, FREET4, T3FREE, THYROIDAB in the last 72 hours. Anemia Panel: No results for input(s): VITAMINB12, FOLATE, FERRITIN, TIBC, IRON, RETICCTPCT in the last 72 hours. Sepsis Labs: Recent Labs  Lab 02/17/18 1703  LATICACIDVEN 1.2    Recent Results (from the past 240 hour(s))  Culture, blood (routine x 2)     Status: Abnormal   Collection Time: 02/11/18 12:59 PM  Result Value Ref Range Status   Specimen Description   Final    BLOOD LEFT HAND BOTTLES DRAWN AEROBIC AND ANAEROBIC Performed at [facility], [address] [place], [place] [address]    Special Requests   Final    Blood Culture adequate volume Performed at [facility], [address] [place], [place] [address]    Culture  Setup Time   Final    GRAM POSITIVE COCCI IN CLUSTERS RECOVERED FROM THE ANAEROBIC BOTTLE AND THE AEROBIC BOTTLE Gram Stain Report Called to,Read Back By and Verified With: MOTLEY,J. AT 1048 ON 02/12/2018 BY BAUGHAM,M. Performed at [facility] CRITICAL RESULT CALLED TO, READ BACK BY AND VERIFIED WITH: HURTH PHARMD AT 1402 ON [id number] BY SJW CRITICAL VALUE NOTED.  VALUE IS CONSISTENT WITH PREVIOUSLY REPORTED AND CALLED VALUE.    Culture (A)  Final    STAPHYLOCOCCUS AUREUS SUSCEPTIBILITIES  PERFORMED ON PREVIOUS CULTURE WITHIN THE LAST 5 DAYS. Performed at [facility], [address], [place], [place] [address]    Report Status 02/14/2018 FINAL  Final  Culture, blood (routine x 2)     Status: None   Collection Time: 02/12/18 10:28 PM  Result Value Ref Range Status   Specimen Description BLOOD LEFT ARM  Final   Special Requests   Final    BOTTLES DRAWN AEROBIC AND ANAEROBIC Blood Culture adequate volume   Culture   Final    NO GROWTH 5 DAYS Performed at [facility], [address] [place], [place] [address]    Report Status 02/17/2018 FINAL  Final  Culture, blood (routine x 2)     Status: None   Collection Time: 02/12/18 10:30 PM  Result Value Ref Range Status   Specimen Description BLOOD LEFT HAND  Final   Special Requests   Final    BOTTLES DRAWN  AEROBIC ONLY Blood Culture adequate volume   Culture   Final    NO GROWTH 5 DAYS Performed at [facility], [address] [place], [place] [address]    Report Status 02/17/2018 FINAL  Final         Radiology Studies: No results found.      Scheduled Meds: . sodium chloride   Intravenous Once  . diclofenac  1 patch Transdermal BID  . HYDROmorphone  1 mg Oral Q3H  . metoprolol tartrate  25 mg Oral BID  . multivitamin with minerals  1 tablet Oral Daily  . nicotine  14 mg Transdermal Daily  . pantoprazole  40 mg Oral BID  . sodium chloride flush  3 mL Intravenous Q12H   Continuous Infusions: .  ceFAZolin (ANCEF) IV 2 g (02/20/18 0606)     LOS: 12 days    Time spent: 30 minutes    Chrishelle Zito Hoover Brunette Renisha Cockrum, DO [facility] Pager [phone]  If 7PM-7AM, please contact night-coverage [email or web address] Password [email or web address] 02/20/2018, 10:26 AM

## 2018-02-20 NOTE — Progress Notes (Addendum)
Patient very agitated gave Dilaudid that is scheduled, Dr Sherryll Burger called earlier, ok to give Oxycodone when it comes up due again. Patient rates Pain 10 out of 10. Patient has been more alert today, has walked to the bathroom and sat in chair for a couple of hours.

## 2018-02-20 NOTE — Progress Notes (Signed)
Attempted to bathe patient and change bed. Patient refused and told staff to leave her room.

## 2018-02-21 MED ORDER — OXYCODONE HCL 5 MG PO TABS
10.0000 mg | ORAL_TABLET | Freq: Four times a day (QID) | ORAL | Status: DC | PRN
  Administered 2018-02-21 – 2018-02-23 (×4): 10 mg via ORAL
  Filled 2018-02-21 (×5): qty 2

## 2018-02-21 MED ORDER — DULOXETINE HCL 30 MG PO CPEP
30.0000 mg | ORAL_CAPSULE | Freq: Two times a day (BID) | ORAL | Status: DC
  Administered 2018-02-21 – 2018-03-26 (×66): 30 mg via ORAL
  Filled 2018-02-21 (×67): qty 1

## 2018-02-21 MED ORDER — GABAPENTIN 100 MG PO CAPS
200.0000 mg | ORAL_CAPSULE | Freq: Three times a day (TID) | ORAL | Status: DC
  Administered 2018-02-21 – 2018-02-24 (×10): 200 mg via ORAL
  Filled 2018-02-21 (×10): qty 2

## 2018-02-21 MED ORDER — METHOCARBAMOL 500 MG PO TABS
500.0000 mg | ORAL_TABLET | Freq: Four times a day (QID) | ORAL | Status: DC
  Administered 2018-02-21 – 2018-02-24 (×12): 500 mg via ORAL
  Filled 2018-02-21 (×12): qty 1

## 2018-02-21 MED ORDER — SODIUM CHLORIDE 0.9 % IV SOLN
INTRAVENOUS | Status: DC
  Administered 2018-02-21 – 2018-03-02 (×8): via INTRAVENOUS

## 2018-02-21 MED ORDER — SENNOSIDES-DOCUSATE SODIUM 8.6-50 MG PO TABS
2.0000 | ORAL_TABLET | Freq: Two times a day (BID) | ORAL | Status: DC
  Administered 2018-02-21 – 2018-03-26 (×43): 2 via ORAL
  Filled 2018-02-21 (×60): qty 2

## 2018-02-21 MED ORDER — HYDROMORPHONE HCL 2 MG PO TABS
1.0000 mg | ORAL_TABLET | ORAL | Status: DC
  Administered 2018-02-21 – 2018-02-23 (×10): 1 mg via ORAL
  Filled 2018-02-21 (×12): qty 1

## 2018-02-21 NOTE — Progress Notes (Signed)
Patient ambulated to the bathroom with minimal assist

## 2018-02-21 NOTE — Progress Notes (Signed)
Pt in agreeance with TEDs being applied. Size large-knee applied at this time. Educated pt that compression stockings will assist in decreasing swelling to legs. Offered assistance to restroom at this time, pt denies need. Will continue to monitor.

## 2018-02-21 NOTE — Progress Notes (Signed)
Physical Therapy Treatment Patient Details Name: Amy Mcknight MRN: [id number] DOB: Oct 16, 1995 Today's Date: 02/21/2018    History of Present Illness Amy Mcknight is a 23 y.o. female with medical history significant for substance use disorder who presents to the ED with 2 days of right lower back pain.  Patient states symptoms began with acute onset while at rest.  Symptoms have been persistent.  She has noted associated diaphoresis, chills, shortness of breath, cough productive of brown sputum, and right chest wall pain.  She denies any rashes or obvious skin changes.  She reports good urine output without dysuria.  She denies any abdominal pain, diarrhea, or constipation. 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  She continues to use recreational drugs by snorting nasally.  She denies any alcohol use.  She reports a history of withdrawal from opiates in the past.    PT Comments    Pt supine in bed and willing to participate with therapy.  Noted decreased eye contact with conversatoin and increased time for response.  Pt educated on purpose and benefits of completing therapy to assist with strength and mobility.  Pt agitated through session, limited by Rt LE pain 10/10.  Used RW for safety with gait, only able to make 5-6 steps at bedside due to pain.  EOS pt left in chair with call bell within reach, RN aware of status.  Following 15 minutes pt requested assistance back to bed due to pain.  RN aware and pain medication given.    Follow Up Recommendations  Home health PT;Supervision - Intermittent;Supervision for mobility/OOB     Equipment Recommendations  Rolling walker with 5" wheels    Recommendations for Other Services       Precautions / Restrictions Precautions Precautions: Fall    Mobility  Bed Mobility Overal bed mobility: Needs Assistance Bed Mobility: Supine to Sit     Supine to sit: Mod assist     General bed mobility comments: increased time, mod assist  for B LE management and trunk uprighting, use of bed rail  Transfers Overall transfer level: Needs assistance Equipment used: Rolling walker (2 wheeled) Transfers: Sit to/from Stand Sit to Stand: Min assist         General transfer comment: increased time, verbal cues for hand placement, elevated bed  Ambulation/Gait Ambulation/Gait assistance: Min assist Gait Distance (Feet): 6 Feet Assistive device: Rolling walker (2 wheeled) Gait Pattern/deviations: Decreased step length - right;Decreased step length - left;Decreased stride length Gait velocity: decreased   General Gait Details: slow, unsteady 5-6 steps at bedside.  Verbal cueing for RW manageme.  No foot drop noted.  Limited secondary to Rt LE, no LOB or c/o SOB.     Stairs             Wheelchair Mobility    Modified Rankin (Stroke Patients Only)       Balance                                            Cognition Arousal/Alertness: Awake/alert Behavior During Therapy: Agitated(Decreased eye contact and prolonged answer to questions) Overall Cognitive Status: Within Functional Limits for tasks assessed                                 General Comments: Pt agitated  thorughout treatment session, required constant education on benefits of physical therapy and encouragement to participate      Exercises      General Comments        Pertinent Vitals/Pain Pain Assessment: No/denies pain Pain Score: 10-Worst pain ever Pain Location: Rt LE Pain Descriptors / Indicators: Constant;Other (Comment)("it just hurts") Pain Intervention(s): Limited activity within patient's tolerance;Monitored during session;Repositioned    Home Living                      Prior Function            PT Goals (current goals can now be found in the care plan section)      Frequency    Min 2X/week      PT Plan Current plan remains appropriate    Co-evaluation               AM-PAC PT "6 Clicks" Mobility   Outcome Measure  Help needed turning from your back to your side while in a flat bed without using bedrails?: A Lot Help needed moving from lying on your back to sitting on the side of a flat bed without using bedrails?: A Lot Help needed moving to and from a bed to a chair (including a wheelchair)?: A Little Help needed standing up from a chair using your arms (e.g., wheelchair or bedside chair)?: A Little Help needed to walk in hospital room?: A Little Help needed climbing 3-5 steps with a railing? : A Lot 6 Click Score: 15    End of Session Equipment Utilized During Treatment: Gait belt Activity Tolerance: Patient tolerated treatment well;Patient limited by pain;Patient limited by fatigue Patient left: in chair;with call bell/phone within reach(RN aware of status) Nurse Communication: Mobility status PT Visit Diagnosis: Unsteadiness on feet (R26.81);Other abnormalities of gait and mobility (R26.89);Muscle weakness (generalized) (M62.81)     Time: 6256-3893 PT Time Calculation (min) (ACUTE ONLY): 15 min  Charges:  $Therapeutic Activity: 8-22 mins                     [address] LPTA; CBIS [phone]   Juel Burrow 02/21/2018, 11:34 AM

## 2018-02-21 NOTE — Progress Notes (Addendum)
Patient Demographics:    Amy Mcknight, is a 23 y.o. female, DOB - 05-Oct-1995, ZOX:[id number]  Admit date - 02/08/2018   Admitting Physician Charlsie Quest, MD  Outpatient Primary MD for the patient is Patient, No Pcp Per  LOS - 13   No chief complaint on file.       Subjective:    Amy Mcknight today has no fevers, no emesis,  No chest pain, requesting increase in Dilaudid dosage and frequency, RN at bedside, falls asleep easily, wakes up and then starts asking for additional narcotics, refusing to participate in physical therapy refusing blood draws  Assessment  & Plan :    Principal Problem:   MSSA bacteremia Active Problems:   Cavitary pneumonia   AKI (acute kidney injury) (HCC)   Substance use disorder   Hyponatremia   IVDU (intravenous drug user)   Myositis   Normocytic anemia   Thrombocytopenia (HCC)   Cigarette smoker   Hepatitis C antibody test positive   Endocarditis of tricuspid valve  Brief Narrative:  23 y.o.femalewith medical history significant forsubstance use disorderwho presents to the ED with 2 days of right lower back pain. Patient states symptoms began with acute onset while at rest. Symptoms have been persistent. She has noted associated diaphoresis, chills, shortness of breath, cough productive of brown sputum, and right chest wall pain. She denies any rashes or obvious skin changes. She reports good urine output without dysuria. She denies any abdominal pain, diarrhea, or constipation.  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 She continues to use recreational drugs by snorting nasally. She denies any alcohol use. She reports a history of withdrawal from opiates in the past.And is noted to be positive for marijuana and opiates on her UDS.  She is noted to be anemicand is status post 2 unit PRBC transfusion with improvement noted. No overt  bleeding currently identified, but patient is complaining of some epigastric abdominal pain. As result of this pain, she has not been eating very much. She denies any nausea or vomiting.GI has evaluated patient with no need for endoscopy noted at this time.  she refuses bathing and laboratory draws.  She has been using a bedpan and has not been eating very well.  She has been more somnolent from time to time possibly due to oversedation   Plan:-  1)Sepsis with early septic shock in the setting of MSSA bacteremia, tricuspid vegetation, paravertebral myositis and cavitary pneumonia--- -Pneumonia lesions could also represent septic emboli -Case discussed with infectious disease doctor who has recommended continue  IV Ancef from date of first negative culture which was 02/12/2018 -Continue to follow culture results and maintain on peripheral IV for nowwithsupervised treatment. -At this moment plan is for 6 weeks of supervised IV treatment. -2D echo positive for tricuspid vegetation and moderate regurgitation.  Continue oral Dilaudid 1 mg every 3 hours.  Continue to wean as tolerated, patient appears to be sleeping a lot during the day may have to cut back on Dilaudid  2-AKI (acute kidney injury) (HCC)-resolved -Continue close monitoring with labs every other day -IV fluid discontinued  3-Substance use disorder/opiate withdrawal syndrome -Extensive cessation counseling has been provided.  She has not really expressed future decisions about quitting  recreational drugs.. -Continue adjusted wean down narcotic dosage and frequency, give methocarbamol , Cymbalta and gabapentin to help blunt withdrawal symptoms,  4-tobacco abuse -Extensive cessation counseling provided. -nicotine patch offered.   5-positive hepatitis C antibody -Quantitative viral load test undetectable;-No treatment needed , outpatient follow-up with ID/gastroenterology advised  6)bilateral pedal edema- Pt  refuses  to wear TEDs  7)-severe anemia-suspecthemolysis in the setting of acute infection-stable -Anemia panelrevealed some iron deficiency and stool occult is negative -2 unit PRBC orderedon 2/12 with stable hemoglobin levels noted on repeat CBC -Appreciate GI evaluation with no recommendations for endoscopy at this time -Continue p.o. PPI, -Ferahemegiven during this admission due to iron deficiency  8)-lethargy/depression-----Cymbalta for depression, anxiety and pain issues, stop Remeron due to excessive sedation     DVT prophylaxis:SCDs Code Status:Full code Family Communication:No family at bedside. Disposition Plan:Remains inpatient, continue IV Ancef for total 6 weeks as patient is not a candidate for PICC line placement or transfer to another facility based on IV drug use. Tele-sitter at bedside. Pain managementnow with oral narcotics   Will require long-term admission.Lab recheck on 2/20 as she may have rechecks every other day at this point.  .  Consultants:  Infectious disease over the phone (Dr. Orvan Falconer).  GI Dr. Karilyn Cota  Palliative care over phone  Procedures:  2D echo:  Fraction (55-60%), and no microscopic valve with moderate regurgitation and large vegetation. No wall motion normalities appreciated.  Antimicrobials:  Cefazolin 2/7->  Vancomycin 2/7-2/7      Lab Results  Component Value Date   PLT 151 02/20/2018    Inpatient Medications  Scheduled Meds: . sodium chloride   Intravenous Once  . diclofenac  1 patch Transdermal BID  . HYDROmorphone  1 mg Oral Q3H  . metoprolol tartrate  25 mg Oral BID  . mirtazapine  7.5 mg Oral QHS  . multivitamin with minerals  1 tablet Oral Daily  . nicotine  14 mg Transdermal Daily  . pantoprazole  40 mg Oral BID  . sodium chloride flush  3 mL Intravenous Q12H   Continuous Infusions: .  ceFAZolin (ANCEF) IV 2 g (02/21/18 1423)   PRN Meds:.acetaminophen **OR** acetaminophen, albuterol,  guaiFENesin-dextromethorphan, ondansetron **OR** ondansetron (ZOFRAN) IV, oxyCODONE, pentafluoroprop-tetrafluoroeth, senna-docusate    Anti-infectives (From admission, onward)   Start     Dose/Rate Route Frequency Ordered Stop   02/09/18 1500  vancomycin (VANCOCIN) IVPB 750 mg/150 ml premix  Status:  Discontinued     750 mg 150 mL/hr over 60 Minutes Intravenous Every 24 hours 02/08/18 1553 02/09/18 1201   02/09/18 1400  ceFAZolin (ANCEF) IVPB 2g/100 mL premix     2 g 200 mL/hr over 30 Minutes Intravenous Every 8 hours 02/09/18 1220 03/22/18 2359   02/09/18 1300  ceFAZolin (ANCEF) 2 g in dextrose 5 % 100 mL IVPB  Status:  Discontinued     2 g 200 mL/hr over 30 Minutes Intravenous Every 8 hours 02/09/18 1201 02/09/18 1220   02/09/18 0000  meropenem (MERREM) 1 g in sodium chloride 0.9 % 100 mL IVPB  Status:  Discontinued     1 g 200 mL/hr over 30 Minutes Intravenous Every 12 hours 02/08/18 1459 02/09/18 0853   02/08/18 1445  vancomycin (VANCOCIN) IVPB 1000 mg/200 mL premix     1,000 mg 200 mL/hr over 60 Minutes Intravenous  Once 02/08/18 1442 02/08/18 1555   02/08/18 1415  meropenem (MERREM) 1 g in sodium chloride 0.9 % 100 mL IVPB     1 g 200  mL/hr over 30 Minutes Intravenous  Once 02/08/18 1402 02/08/18 1455        Objective:   Vitals:   02/21/18 0153 02/21/18 0500 02/21/18 0614 02/21/18 1550  BP:   (!) 135/102   Pulse:   (!) 136   Resp:      Temp:   100 F (37.8 C)   TempSrc:   Oral   SpO2: 94%  92% 92%  Weight:  66.4 kg    Height:        Wt Readings from Last 3 Encounters:  02/21/18 66.4 kg     Intake/Output Summary (Last 24 hours) at 02/21/2018 1710 Last data filed at 02/21/2018 1300 Gross per 24 hour  Intake 360 ml  Output -  Net 360 ml     Physical Exam Patient is examined daily including today on 02/21/18 , exams remain the same as of yesterday except that has changed   Gen:-Sleepy, wakes up when aroused,  HEENT:- [place].AT, No sclera icterus Neck-Supple  Neck,No JVD,.  Lungs-  CTAB , fair symmetrical air movement CV- S1, S2 normal, regular  Abd-  +ve B.Sounds, Abd Soft, No tenderness,    Extremity/Skin:- No  edema, pedal pulses present  Psych-affect is flat to lethargic, oriented x3 Neuro-no new focal deficits, no tremors   Data Review:   Micro Results Recent Results (from the past 240 hour(s))  Culture, blood (routine x 2)     Status: None   Collection Time: 02/12/18 10:28 PM  Result Value Ref Range Status   Specimen Description BLOOD LEFT ARM  Final   Special Requests   Final    BOTTLES DRAWN AEROBIC AND ANAEROBIC Blood Culture adequate volume   Culture   Final    NO GROWTH 5 DAYS Performed at [facility], [address] [place], [place] [address]    Report Status 02/17/2018 FINAL  Final  Culture, blood (routine x 2)     Status: None   Collection Time: 02/12/18 10:30 PM  Result Value Ref Range Status   Specimen Description BLOOD LEFT HAND  Final   Special Requests   Final    BOTTLES DRAWN AEROBIC ONLY Blood Culture adequate volume   Culture   Final    NO GROWTH 5 DAYS Performed at [facility], [address] [place], [place] [address]    Report Status 02/17/2018 FINAL  Final  Culture, blood (routine x 2)     Status: None (Preliminary result)   Collection Time: 02/20/18  3:35 PM  Result Value Ref Range Status   Specimen Description BLOOD RIGHT FOREARM  Final   Special Requests   Final    BOTTLES DRAWN AEROBIC AND ANAEROBIC Blood Culture adequate volume   Culture   Final    NO GROWTH < 24 HOURS Performed at [facility], [address] [place], [place] [address]    Report Status PENDING  Incomplete  Culture, blood (routine x 2)     Status: None (Preliminary result)   Collection Time: 02/20/18  3:53 PM  Result Value Ref Range Status   Specimen Description LEFT ANTECUBITAL  Final   Special Requests   Final    BOTTLES DRAWN AEROBIC AND ANAEROBIC Blood Culture adequate volume   Culture   Final    NO GROWTH < 24  HOURS Performed at [facility], [address] [place], [place] [address]    Report Status PENDING  Incomplete    Radiology Reports Ct Abdomen Pelvis Wo Contrast  Result Date: 02/08/2018  CLINICAL DATA:  Abdominal pain and fever EXAM: CT ABDOMEN AND PELVIS WITHOUT CONTRAST TECHNIQUE: Multidetector CT imaging of the abdomen and pelvis was performed following the standard protocol without oral or IV contrast. COMPARISON:  None. FINDINGS: Lower chest: There is airspace consolidation throughout the lung bases with several areas of cavitation, likely developing lung abscesses. Several nodular opacities are noted associated with these areas of cavitary consolidation, likely septic emboli. Hepatobiliary: Liver measures 20.5 cm in length. No focal liver lesions are appreciable on this noncontrast enhanced study. The gallbladder is borderline dilated without wall thickening. There is no biliary duct dilatation. Pancreas: No pancreatic mass or inflammatory focus. Spleen: Spleen measures 14.1 x 10.9 x 5.1 cm with a measured splenic volume of 392 cubic cm. No focal splenic lesions are evident. Adrenals/Urinary Tract: Adrenals appear unremarkable bilaterally. 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 No renal mass evident. No hydronephrosis on either side. There is a suspected developing staghorn calculus on the left with somewhat amorphous calcification involving a lower pole calyx on the left extending into the left renal pelvis, best appreciated on coronal imaging. This developing staghorn calculus measures 3.7 x 1.0 cm. No ureteral calculi are evident. Urinary bladder is decompressed. No urinary bladder wall thickening is appreciable with essentially empty bladder. Stomach/Bowel: There is fluid throughout most small bowel loops. There is no appreciable bowel wall or mesenteric thickening. No evident bowel obstruction. No free air or portal venous air is evident. Vascular/Lymphatic: There is no abdominal aortic  aneurysm. No vascular lesions are evident on this noncontrast enhanced study. Note that the attenuation throughout the vascular structures appear slightly diminished which raises question of anemia. No adenopathy is appreciable in abdomen or pelvis. Reproductive: Uterus is anteverted. No pelvic masses evident. There is a slight degree of free fluid in the cul-de-sac region. Other: Appendix is not well seen. There is no periappendiceal region inflammation on this study. No abscess is seen in the abdomen or pelvis. There is no ascites beyond the slight fluid noted in the cul-de-sac region. Musculoskeletal: There are no blastic or lytic bone lesions. No intramuscular or abdominal wall lesions are appreciable. IMPRESSION: Comment: Note that paucity of fat makes assessment in the abdomen and pelvis somewhat less than optimal. 1. Areas of cavitary pneumonia, likely with developing lung abscesses, in the right lower lobe. Areas of consolidation elsewhere with probable septic emboli associated. 2. Prominent liver and spleen. No focal liver and splenic lesions evident. 3. There is evidence of a degree of nephrocalcinosis. Amorphous calcification in a portion of the left renal pelvis and an inferior left calyx suggest developing staghorn calculus. No hydronephrosis on either side. No well-defined ureteral calculi. 4. Fluid in most loops of small bowel. Suspect a degree of ileus or enteritis. No bowel obstruction. No abscess evident in the abdomen or pelvis. 5. Small amount of free fluid in the cul-de-sac may be upper physiologic. Electronically Signed   By: Bretta Bang III M.D.   On: 02/08/2018 14:47   Dg Chest 1 View  Result Date: 02/08/2018 CLINICAL DATA:  Hypotension and tachycardia. Smoking history. Asthma. EXAM: CHEST  1 VIEW COMPARISON:  None. FINDINGS: Heart size is within normal limits. Mild prominence of the LEFT hilum. Patchy opacities bilaterally, most prominent at the RIGHT lung base, majority of which  have central lucencies suggest cavitary lesions. IMPRESSION: 1. Patchy opacities bilaterally, majority of which have central lucencies suggest cavitary masses/infections. Corresponding cavitary consolidations noted at the lung bases of a CT abdomen performed at the time of  today's chest x-ray. Recommend CT chest with contrast for complete evaluation. 2. Questionable prominence of the LEFT hilum, additional cavitary mass/consolidation versus central bronchiectasis. Electronically Signed   By: Bary RichardStan  Maynard M.D.   On: 02/08/2018 14:48   Mr Lumbar Spine W Wo Contrast  Result Date: 02/08/2018 CLINICAL DATA:  Acute onset of back pain beginning 2 days ago. Study was ordered with contrast but the patient refused. EXAM: MRI LUMBAR SPINE WITHOUT CONTRAST TECHNIQUE: Multiplanar, multisequence MR imaging of the lumbar spine was performed. No intravenous contrast was administered. COMPARISON:  None. FINDINGS: Segmentation:  5 lumbar type vertebral bodies. Alignment:  Normal Vertebrae:  Normal Conus medullaris and cauda equina: Conus extends to the L1 level. Conus and cauda equina appear normal. Paraspinal and other soft tissues: There is lower paraspinous muscle edema as might be seen with a muscular strain. This appears symmetric from right to left. Disc levels: No abnormality at L3-4 or above. L4-5: Very minimal disc bulge.  No stenosis or neural compression. L5-S1: Very minimal disc bulge.  No stenosis or neural compression. IMPRESSION: Minimal disc bulges at L4-5 and L5-S1. No stenosis or neural compression. Paraspinous muscle edema on both sides in the lower lumbar region as might be seen with a muscular strain. This is nonspecific. The differential diagnosis does include infection, but the symmetric nature would be unusual. The patient refused contrast administration. Electronically Signed   By: Paulina FusiMark  Shogry M.D.   On: 02/08/2018 13:56   Dg Chest Port 1 View  Result Date: 02/20/2018 CLINICAL DATA:  Sepsis,  endocarditis, cavitary pneumonia with septic emboli and fever. EXAM: PORTABLE CHEST 1 VIEW COMPARISON:  02/08/2018 FINDINGS: Multiple cavitary lesions again identified in both lungs with associated airspace disease in a pattern likely representing septic emboli with multiple areas of cavitary infection. There is a new left lateral pleural effusion which appears likely loculated and may represent an empyema. Further evaluation with CT of the chest with contrast may be helpful. The heart size is stable. IMPRESSION: Persistent appearance of bilateral cavitary pneumonia likely representing septic emboli. There is a new loculated left lateral pleural effusion that may represent a developing empyema. Further evaluation with CT of the chest with contrast may be helpful. Electronically Signed   By: Irish LackGlenn  Yamagata M.D.   On: 02/20/2018 19:11     CBC Recent Labs  Lab 02/15/18 0314 02/18/18 0940 02/20/18 0932  WBC 30.3* 21.9* 13.8*  HGB 9.9* 10.0* 8.3*  HCT 32.7* 33.6* 27.5*  PLT 123* 123* 151  MCV 84.1 87.7 85.9  MCH 25.4* 26.1 25.9*  MCHC 30.3 29.8* 30.2  RDW 19.7* 20.6* 20.4*    Chemistries  Recent Labs  Lab 02/15/18 0314 02/18/18 0940 02/20/18 0932  NA 130* 130* 134*  K 5.2* 4.7 4.2  CL 104 103 103  CO2 17* 18* 23  GLUCOSE 110* 103* 99  BUN 13 8 <5*  CREATININE 0.59 0.63 0.44  CALCIUM 7.4* 7.4* 7.4*  MG  --  1.7  --   AST 102* 70* 50*  ALT 30 21 17   ALKPHOS 101 100 83  BILITOT 0.6 0.7 0.3   ------------------------------------------------------------------------------------------------------------------ No results for input(s): CHOL, HDL, LDLCALC, TRIG, CHOLHDL, LDLDIRECT in the last 72 hours.  No results found for: HGBA1C ------------------------------------------------------------------------------------------------------------------ No results for input(s): TSH, T4TOTAL, T3FREE, THYROIDAB in the last 72 hours.  Invalid input(s):  FREET3 ------------------------------------------------------------------------------------------------------------------ No results for input(s): VITAMINB12, FOLATE, FERRITIN, TIBC, IRON, RETICCTPCT in the last 72 hours.  Coagulation profile No results for input(s): INR,  PROTIME in the last 168 hours.  No results for input(s): DDIMER in the last 72 hours.  Cardiac Enzymes No results for input(s): CKMB, TROPONINI, MYOGLOBIN in the last 168 hours.  Invalid input(s): CK ------------------------------------------------------------------------------------------------------------------ No results found for: BNP   Shon Hale M.D on 02/21/2018 at 5:10 PM  Go to [email or web address] - for contact info  [facility] - Office  [phone]

## 2018-02-22 LAB — BASIC METABOLIC PANEL
Anion gap: 9 (ref 5–15)
BUN: 5 mg/dL — ABNORMAL LOW (ref 6–20)
CO2: 23 mmol/L (ref 22–32)
Calcium: 7.6 mg/dL — ABNORMAL LOW (ref 8.9–10.3)
Chloride: 102 mmol/L (ref 98–111)
Creatinine, Ser: 0.47 mg/dL (ref 0.44–1.00)
GFR calc Af Amer: >60 mL/min (ref >60)
Glucose, Bld: 111 mg/dL — ABNORMAL HIGH (ref 70–99)
Potassium: 4.1 mmol/L (ref 3.5–5.1)
Sodium: 134 mmol/L — ABNORMAL LOW (ref 135–145)

## 2018-02-22 LAB — CBC
HCT: 26.9 % — ABNORMAL LOW (ref 36.0–46.0)
Hemoglobin: 8 g/dL — ABNORMAL LOW (ref 12.0–15.0)
MCH: 26.1 pg (ref 26.0–34.0)
MCHC: 29.7 g/dL — AB (ref 30.0–36.0)
MCV: 87.6 fL (ref 80.0–100.0)
PLATELETS: 198 10*3/uL (ref 150–400)
RBC: 3.07 MIL/uL — ABNORMAL LOW (ref 3.87–5.11)
RDW: 20.9 % — ABNORMAL HIGH (ref 11.5–15.5)
WBC: 12.3 10*3/uL — ABNORMAL HIGH (ref 4.0–10.5)
nRBC: 0 % (ref 0.0–0.2)

## 2018-02-22 MED ORDER — IPRATROPIUM-ALBUTEROL 0.5-2.5 (3) MG/3ML IN SOLN
3.0000 mL | Freq: Four times a day (QID) | RESPIRATORY_TRACT | Status: DC
  Administered 2018-02-22 – 2018-03-01 (×22): 3 mL via RESPIRATORY_TRACT
  Filled 2018-02-22 (×27): qty 3

## 2018-02-22 NOTE — Progress Notes (Signed)
Order for 1:1 sitter noted. Unavailable at this time. Telesitter already active.

## 2018-02-22 NOTE — Progress Notes (Signed)
Patient Demographics:    Amy Mcknight, is a 23 y.o. female, DOB - 08/09/1995, WUJ:[id number]  Admit date - 02/08/2018   Admitting Physician Charlsie Quest, MD  Outpatient Primary MD for the patient is Patient, No Pcp Per  LOS - 14   No chief complaint on file.       Subjective:    Amy Mcknight today has no fevers, no emesis,  No chest pain, falls asleep easily, still asking for narcotics when  she wakes up   Assessment  & Plan :    Principal Problem:   MSSA bacteremia Active Problems:   Cavitary pneumonia   AKI (acute kidney injury) (HCC)   Substance use disorder   Hyponatremia   IVDU (intravenous drug user)   Myositis   Normocytic anemia   Thrombocytopenia (HCC)   Cigarette smoker   Hepatitis C antibody test positive   Endocarditis of tricuspid valve  Brief Narrative:  23 y.o.femalewith medical history significant forsubstance use disorderwho presents to the ED with 2 days of right lower back pain. Patient states symptoms began with acute onset while at rest. Symptoms have been persistent. She has noted associated diaphoresis, chills, shortness of breath, cough productive of brown sputum, and right chest wall pain. She denies any rashes or obvious skin changes. She reports good urine output without dysuria. She denies any abdominal pain, diarrhea, or constipation.  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 She continues to use recreational drugs by snorting nasally. She denies any alcohol use. She reports a history of withdrawal from opiates in the past.And is noted to be positive for marijuana and opiates on her UDS.  She is noted to be anemicand is status post 2 unit PRBC transfusion with improvement noted. No overt bleeding currently identified, but patient is complaining of some epigastric abdominal pain. As result of this pain, she has not been eating very  much. She denies any nausea or vomiting.GI has evaluated patient with no need for endoscopy noted at this time.  she refuses bathing and laboratory draws.  She has been using a bedpan and has not been eating very well.  She has been more somnolent from time to time possibly due to oversedation   Plan:-  1)Sepsis with early septic shock in the setting of MSSA bacteremia, tricuspid vegetation, paravertebral myositis and cavitary pneumonia--- -Pneumonia lesions could also represent septic emboli -Case discussed previously with infectious disease doctor who has recommended continue  IV Ancef from date of first negative culture which was 02/12/2018 - -At this moment plan is for 6 weeks of supervised IV treatment.  -2D echo positive for tricuspid vegetation and moderate regurgitation.    Continue  to wean Narcotics as tolerated, patient with excessive daytime sleepiness , repeat blood cultures from 02/20/2018 Negative to date  2-AKI (acute kidney injury) (HCC)-resolved---, continue to encourage adequate oral intake   3-Substance use disorder/opiate withdrawal syndrome-- -Extensive cessation counseling has been provided, patient continues to ask for extra doses of narcotics, however she appears to be resting comfortably for extended/long periods of time without any evidence of pain, we will Continue adjusted wean down narcotic dosage and frequency, c/n  methocarbamol , Cymbalta and gabapentin to help blunt withdrawal symptoms,  4-tobacco  abuse---Extensive cessation counseling provided. -nicotine patch offered.   5-positive hepatitis C antibody -Quantitative viral load test undetectable;-No treatment needed , outpatient follow-up with ID/gastroenterology advised  6)bilateral pedal edema- Pt  refuses to wear TEDs  7)-severe anemia-suspecthemolysis in the setting of acute infection-stable -Anemia panelrevealed some iron deficiency and stool occult is negative -2 unit PRBC orderedon 2/12  with stable hemoglobin levels noted on repeat CBC -Appreciate GI evaluation with no recommendations for endoscopy at this time -Continue p.o. PPI, -Ferahemegiven during this admission due to iron deficiency  8)-lethargy/depression-----c/n Cymbalta for depression, anxiety and pain issues, stop Remeron due to excessive sedation   DVT prophylaxis:SCDs Code Status:Full code Family Communication:No family at bedside. Disposition Plan:Remains inpatient, continue IV Ancef for total 6 weeks  From first negative culture as patient is not a candidate for PICC line placement or transfer to another facility based on IV drug use. Tele-sitter at bedside.    .  .  Consultants:  Infectious disease over the phone (Dr. Orvan Falconer).  GI Dr. Karilyn Cota  Palliative care over phone  Procedures:  2D echo:  Fraction (55-60%), and no microscopic valve with moderate regurgitation and large vegetation. No wall motion normalities appreciated.  Antimicrobials:  Cefazolin 2/7->   Vancomycin 2/7-2/7      Lab Results  Component Value Date   PLT 198 02/22/2018    Inpatient Medications  Scheduled Meds: . diclofenac  1 patch Transdermal BID  . DULoxetine  30 mg Oral BID  . gabapentin  200 mg Oral TID  . HYDROmorphone  1 mg Oral Q4H  . ipratropium-albuterol  3 mL Nebulization Q6H  . methocarbamol  500 mg Oral QID  . metoprolol tartrate  25 mg Oral BID  . multivitamin with minerals  1 tablet Oral Daily  . nicotine  14 mg Transdermal Daily  . pantoprazole  40 mg Oral BID  . senna-docusate  2 tablet Oral BID  . sodium chloride flush  3 mL Intravenous Q12H   Continuous Infusions: . sodium chloride 50 mL/hr at 02/22/18 0300  .  ceFAZolin (ANCEF) IV 2 g (02/22/18 1514)   PRN Meds:.acetaminophen **OR** acetaminophen, albuterol, guaiFENesin-dextromethorphan, ondansetron **OR** ondansetron (ZOFRAN) IV, oxyCODONE, pentafluoroprop-tetrafluoroeth    Anti-infectives (From admission,  onward)   Start     Dose/Rate Route Frequency Ordered Stop   02/09/18 1500  vancomycin (VANCOCIN) IVPB 750 mg/150 ml premix  Status:  Discontinued     750 mg 150 mL/hr over 60 Minutes Intravenous Every 24 hours 02/08/18 1553 02/09/18 1201   02/09/18 1400  ceFAZolin (ANCEF) IVPB 2g/100 mL premix     2 g 200 mL/hr over 30 Minutes Intravenous Every 8 hours 02/09/18 1220 03/22/18 2359   02/09/18 1300  ceFAZolin (ANCEF) 2 g in dextrose 5 % 100 mL IVPB  Status:  Discontinued     2 g 200 mL/hr over 30 Minutes Intravenous Every 8 hours 02/09/18 1201 02/09/18 1220   02/09/18 0000  meropenem (MERREM) 1 g in sodium chloride 0.9 % 100 mL IVPB  Status:  Discontinued     1 g 200 mL/hr over 30 Minutes Intravenous Every 12 hours 02/08/18 1459 02/09/18 0853   02/08/18 1445  vancomycin (VANCOCIN) IVPB 1000 mg/200 mL premix     1,000 mg 200 mL/hr over 60 Minutes Intravenous  Once 02/08/18 1442 02/08/18 1555   02/08/18 1415  meropenem (MERREM) 1 g in sodium chloride 0.9 % 100 mL IVPB     1 g 200 mL/hr over 30 Minutes Intravenous  Once 02/08/18  1402 02/08/18 1455        Objective:   Vitals:   02/22/18 0500 02/22/18 1013 02/22/18 1300 02/22/18 1458  BP:   139/89   Pulse:      Resp:   18   Temp:   98.6 F (37 C)   TempSrc:   Oral   SpO2:  96% 96% 94%  Weight: 63.6 kg     Height:        Wt Readings from Last 3 Encounters:  02/22/18 63.6 kg     Intake/Output Summary (Last 24 hours) at 02/22/2018 1801 Last data filed at 02/22/2018 1100 Gross per 24 hour  Intake 1813.84 ml  Output -  Net 1813.84 ml     Physical Exam Patient is examined daily including today on 02/22/18 , exams remain the same as of yesterday except that has changed   Gen:-Sleepy, wakes up when aroused,  HEENT:- [place].AT, No sclera icterus Neck-Supple Neck,No JVD,.  Lungs-diminished in bases, left more than right, no wheezing  CV- S1, S2 normal, regular  Abd-  +ve B.Sounds, Abd Soft, No tenderness,    Extremity/Skin:- No   edema, pedal pulses present  Psych-affect is flat to lethargic, oriented x3 Neuro-generalized weakness, no new focal deficits, no tremors   Data Review:   Micro Results Recent Results (from the past 240 hour(s))  Culture, blood (routine x 2)     Status: None   Collection Time: 02/12/18 10:28 PM  Result Value Ref Range Status   Specimen Description BLOOD LEFT ARM  Final   Special Requests   Final    BOTTLES DRAWN AEROBIC AND ANAEROBIC Blood Culture adequate volume   Culture   Final    NO GROWTH 5 DAYS Performed at [facility], [address] [place], [place] [address]    Report Status 02/17/2018 FINAL  Final  Culture, blood (routine x 2)     Status: None   Collection Time: 02/12/18 10:30 PM  Result Value Ref Range Status   Specimen Description BLOOD LEFT HAND  Final   Special Requests   Final    BOTTLES DRAWN AEROBIC ONLY Blood Culture adequate volume   Culture   Final    NO GROWTH 5 DAYS Performed at [facility], [address] [place], [place] [address]    Report Status 02/17/2018 FINAL  Final  Culture, blood (routine x 2)     Status: None (Preliminary result)   Collection Time: 02/20/18  3:35 PM  Result Value Ref Range Status   Specimen Description BLOOD RIGHT FOREARM  Final   Special Requests   Final    BOTTLES DRAWN AEROBIC AND ANAEROBIC Blood Culture adequate volume   Culture   Final    NO GROWTH 2 DAYS Performed at [facility], [address] [place], [place] [address]    Report Status PENDING  Incomplete  Culture, blood (routine x 2)     Status: None (Preliminary result)   Collection Time: 02/20/18  3:53 PM  Result Value Ref Range Status   Specimen Description LEFT ANTECUBITAL  Final   Special Requests   Final    BOTTLES DRAWN AEROBIC AND ANAEROBIC Blood Culture adequate volume   Culture   Final    NO GROWTH 2 DAYS Performed at [facility], [address] [place], [place] [address]    Report Status PENDING  Incomplete    Radiology Reports Ct Abdomen  Pelvis Wo Contrast  Result Date: 02/08/2018 CLINICAL DATA:  Abdominal pain and fever EXAM:  CT ABDOMEN AND PELVIS WITHOUT CONTRAST TECHNIQUE: Multidetector CT imaging of the abdomen and pelvis was performed following the standard protocol without oral or IV contrast. COMPARISON:  None. FINDINGS: Lower chest: There is airspace consolidation throughout the lung bases with several areas of cavitation, likely developing lung abscesses. Several nodular opacities are noted associated with these areas of cavitary consolidation, likely septic emboli. Hepatobiliary: Liver measures 20.5 cm in length. No focal liver lesions are appreciable on this noncontrast enhanced study. The gallbladder is borderline dilated without wall thickening. There is no biliary duct dilatation. Pancreas: No pancreatic mass or inflammatory focus. Spleen: Spleen measures 14.1 x 10.9 x 5.1 cm with a measured splenic volume of 392 cubic cm. No focal splenic lesions are evident. Adrenals/Urinary Tract: Adrenals appear unremarkable bilaterally. 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 No renal mass evident. No hydronephrosis on either side. There is a suspected developing staghorn calculus on the left with somewhat amorphous calcification involving a lower pole calyx on the left extending into the left renal pelvis, best appreciated on coronal imaging. This developing staghorn calculus measures 3.7 x 1.0 cm. No ureteral calculi are evident. Urinary bladder is decompressed. No urinary bladder wall thickening is appreciable with essentially empty bladder. Stomach/Bowel: There is fluid throughout most small bowel loops. There is no appreciable bowel wall or mesenteric thickening. No evident bowel obstruction. No free air or portal venous air is evident. Vascular/Lymphatic: There is no abdominal aortic aneurysm. No vascular lesions are evident on this noncontrast enhanced study. Note that the attenuation throughout the vascular structures appear slightly  diminished which raises question of anemia. No adenopathy is appreciable in abdomen or pelvis. Reproductive: Uterus is anteverted. No pelvic masses evident. There is a slight degree of free fluid in the cul-de-sac region. Other: Appendix is not well seen. There is no periappendiceal region inflammation on this study. No abscess is seen in the abdomen or pelvis. There is no ascites beyond the slight fluid noted in the cul-de-sac region. Musculoskeletal: There are no blastic or lytic bone lesions. No intramuscular or abdominal wall lesions are appreciable. IMPRESSION: Comment: Note that paucity of fat makes assessment in the abdomen and pelvis somewhat less than optimal. 1. Areas of cavitary pneumonia, likely with developing lung abscesses, in the right lower lobe. Areas of consolidation elsewhere with probable septic emboli associated. 2. Prominent liver and spleen. No focal liver and splenic lesions evident. 3. There is evidence of a degree of nephrocalcinosis. Amorphous calcification in a portion of the left renal pelvis and an inferior left calyx suggest developing staghorn calculus. No hydronephrosis on either side. No well-defined ureteral calculi. 4. Fluid in most loops of small bowel. Suspect a degree of ileus or enteritis. No bowel obstruction. No abscess evident in the abdomen or pelvis. 5. Small amount of free fluid in the cul-de-sac may be upper physiologic. Electronically Signed   By: Bretta Bang III M.D.   On: 02/08/2018 14:47   Dg Chest 1 View  Result Date: 02/08/2018 CLINICAL DATA:  Hypotension and tachycardia. Smoking history. Asthma. EXAM: CHEST  1 VIEW COMPARISON:  None. FINDINGS: Heart size is within normal limits. Mild prominence of the LEFT hilum. Patchy opacities bilaterally, most prominent at the RIGHT lung base, majority of which have central lucencies suggest cavitary lesions. IMPRESSION: 1. Patchy opacities bilaterally, majority of which have central lucencies suggest cavitary  masses/infections. Corresponding cavitary consolidations noted at the lung bases of a CT abdomen performed at the time of today's chest x-ray. Recommend CT chest with contrast  for complete evaluation. 2. Questionable prominence of the LEFT hilum, additional cavitary mass/consolidation versus central bronchiectasis. Electronically Signed   By: Bary Richard M.D.   On: 02/08/2018 14:48   Mr Lumbar Spine W Wo Contrast  Result Date: 02/08/2018 CLINICAL DATA:  Acute onset of back pain beginning 2 days ago. Study was ordered with contrast but the patient refused. EXAM: MRI LUMBAR SPINE WITHOUT CONTRAST TECHNIQUE: Multiplanar, multisequence MR imaging of the lumbar spine was performed. No intravenous contrast was administered. COMPARISON:  None. FINDINGS: Segmentation:  5 lumbar type vertebral bodies. Alignment:  Normal Vertebrae:  Normal Conus medullaris and cauda equina: Conus extends to the L1 level. Conus and cauda equina appear normal. Paraspinal and other soft tissues: There is lower paraspinous muscle edema as might be seen with a muscular strain. This appears symmetric from right to left. Disc levels: No abnormality at L3-4 or above. L4-5: Very minimal disc bulge.  No stenosis or neural compression. L5-S1: Very minimal disc bulge.  No stenosis or neural compression. IMPRESSION: Minimal disc bulges at L4-5 and L5-S1. No stenosis or neural compression. Paraspinous muscle edema on both sides in the lower lumbar region as might be seen with a muscular strain. This is nonspecific. The differential diagnosis does include infection, but the symmetric nature would be unusual. The patient refused contrast administration. Electronically Signed   By: Paulina Fusi M.D.   On: 02/08/2018 13:56   Dg Chest Port 1 View  Result Date: 02/20/2018 CLINICAL DATA:  Sepsis, endocarditis, cavitary pneumonia with septic emboli and fever. EXAM: PORTABLE CHEST 1 VIEW COMPARISON:  02/08/2018 FINDINGS: Multiple cavitary lesions again  identified in both lungs with associated airspace disease in a pattern likely representing septic emboli with multiple areas of cavitary infection. There is a new left lateral pleural effusion which appears likely loculated and may represent an empyema. Further evaluation with CT of the chest with contrast may be helpful. The heart size is stable. IMPRESSION: Persistent appearance of bilateral cavitary pneumonia likely representing septic emboli. There is a new loculated left lateral pleural effusion that may represent a developing empyema. Further evaluation with CT of the chest with contrast may be helpful. Electronically Signed   By: Irish Lack M.D.   On: 02/20/2018 19:11     CBC Recent Labs  Lab 02/18/18 0940 02/20/18 0932 02/22/18 0941  WBC 21.9* 13.8* 12.3*  HGB 10.0* 8.3* 8.0*  HCT 33.6* 27.5* 26.9*  PLT 123* 151 198  MCV 87.7 85.9 87.6  MCH 26.1 25.9* 26.1  MCHC 29.8* 30.2 29.7*  RDW 20.6* 20.4* 20.9*    Chemistries  Recent Labs  Lab 02/18/18 0940 02/20/18 0932 02/22/18 0941  NA 130* 134* 134*  K 4.7 4.2 4.1  CL 103 103 102  CO2 18* 23 23  GLUCOSE 103* 99 111*  BUN 8 <5* 5*  CREATININE 0.63 0.44 0.47  CALCIUM 7.4* 7.4* 7.6*  MG 1.7  --   --   AST 70* 50*  --   ALT 21 17  --   ALKPHOS 100 83  --   BILITOT 0.7 0.3  --    ------------------------------------------------------------------------------------------------------------------ No results for input(s): CHOL, HDL, LDLCALC, TRIG, CHOLHDL, LDLDIRECT in the last 72 hours.  No results found for: HGBA1C ------------------------------------------------------------------------------------------------------------------ No results for input(s): TSH, T4TOTAL, T3FREE, THYROIDAB in the last 72 hours.  Invalid input(s): FREET3 ------------------------------------------------------------------------------------------------------------------ No results for input(s): VITAMINB12, FOLATE, FERRITIN, TIBC, IRON, RETICCTPCT  in the last 72 hours.  Coagulation profile No results for input(s): INR,  PROTIME in the last 168 hours.  No results for input(s): DDIMER in the last 72 hours.  Cardiac Enzymes No results for input(s): CKMB, TROPONINI, MYOGLOBIN in the last 168 hours.  Invalid input(s): CK ------------------------------------------------------------------------------------------------------------------ No results found for: BNP   Shon Hale M.D on 02/22/2018 at 6:01 PM  Go to [email or web address] - for contact info  [facility] - Office  [phone]

## 2018-02-23 MED ORDER — HYDROXYZINE HCL 25 MG PO TABS
25.0000 mg | ORAL_TABLET | Freq: Every day | ORAL | Status: DC
  Administered 2018-02-23 – 2018-03-09 (×15): 25 mg via ORAL
  Filled 2018-02-23 (×15): qty 1

## 2018-02-23 MED ORDER — OXYCODONE HCL 5 MG PO TABS
5.0000 mg | ORAL_TABLET | Freq: Three times a day (TID) | ORAL | Status: DC | PRN
  Administered 2018-02-23 – 2018-03-13 (×47): 5 mg via ORAL
  Filled 2018-02-23 (×49): qty 1

## 2018-02-23 MED ORDER — HYDROXYZINE HCL 25 MG PO TABS
25.0000 mg | ORAL_TABLET | Freq: Once | ORAL | Status: AC
  Administered 2018-02-23: 25 mg via ORAL
  Filled 2018-02-23: qty 1

## 2018-02-23 MED ORDER — HYDROMORPHONE HCL 2 MG PO TABS
1.0000 mg | ORAL_TABLET | Freq: Three times a day (TID) | ORAL | Status: DC | PRN
  Administered 2018-02-23 – 2018-02-24 (×3): 1 mg via ORAL
  Filled 2018-02-23 (×3): qty 1

## 2018-02-23 NOTE — Progress Notes (Signed)
Patient Demographics:    Amy Mcknight Curci, is a 23 y.o. female, DOB - Feb 13, 1995, ZOX:[id number]  Admit date - 02/08/2018   Admitting Physician Charlsie QuestVishal R Patel, MD  Outpatient Primary MD for the patient is Patient, No Pcp Per  LOS - 15   No chief complaint on file.       Subjective:    Amy Mcknight Rout today has no fevers, no emesis,  No chest pain, continues to be sleepy, often not cooperating with care  Assessment  & Plan :    Principal Problem:   MSSA bacteremia Active Problems:   Cavitary pneumonia   AKI (acute kidney injury) (HCC)   Substance use disorder   Hyponatremia   IVDU (intravenous drug user)   Myositis   Normocytic anemia   Thrombocytopenia (HCC)   Cigarette smoker   Hepatitis C antibody test positive   Endocarditis of tricuspid valve  Brief Narrative:  23 y.o.femalewith medical history significant forsubstance use disorderwho presents to the ED with 2 days of right lower back pain. Patient states symptoms began with acute onset while at rest. Symptoms have been persistent. She has noted associated diaphoresis, chills, shortness of breath, cough productive of brown sputum, and right chest wall pain. She denies any rashes or obvious skin changes. She reports good urine output without dysuria. She denies any abdominal pain, diarrhea, or constipation.  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 She continues to use recreational drugs by snorting nasally. She denies any alcohol use. She reports a history of withdrawal from opiates in the past.And is noted to be positive for marijuana and opiates on her UDS.  She is noted to be anemicand is status post 2 unit PRBC transfusion with improvement noted. No overt bleeding currently identified, but patient is complaining of some epigastric abdominal pain. As result of this pain, she has not been eating very much. She  denies any nausea or vomiting.GI has evaluated patient with no need for endoscopy noted at this time.  she refuses bathing and laboratory draws.  She has been using a bedpan and has not been eating very well.  She has been more somnolent from time to time possibly due to oversedation   Plan:-  1)Sepsis with early septic shock in the setting of MSSA bacteremia, tricuspid vegetation, paravertebral myositis and cavitary pneumonia--- sepsis pathophysiology and septic shock appears to have resolved   -Pneumonia lesions could also represent septic emboli-------Case discussed previously with infectious disease doctor who has recommended continue  IV Ancef for total of 6 weeks from date of first negative culture which was 02/12/2018 in a supervised setting (IVDU) -2D echo positive for tricuspid vegetation and moderate regurgitation.  No further fevers , repeat blood cultures from 02/20/2018 Negative to date, WBC is down to 12.3 from 21.9  2)-AKI (acute kidney injury) (HCC)-resolved---, continue to encourage adequate oral intake  3)-Substance use disorder/opiate withdrawal syndrome-- -  patient continues to ask for extra doses of narcotics, for the most part she appears to be resting comfortably for extended/long periods of time without any evidence of pain, we will Continue adjusted wean down narcotic dosage and frequency, c/n  methocarbamol , Cymbalta and gabapentin to help blunt withdrawal symptoms, give hydroxyzine nightly  4)Tobacco Abuse---Extensive  cessation counseling provided--continue nicotine patch   5)positive hepatitis C antibody -Quantitative viral load test undetectable;-No treatment needed , outpatient follow-up with ID/gastroenterology advised  6)Generalized weakness and debility----continues to refuse from time to time to work with physical therapy, patient continues to refuse to participate in ADLs often, wanting to have staff do more and more for her--- encourage to participate  more in ADLs and physical therapy, consider repeat lumbar MRI with contrast if increased back pain or further concerns about possible epidural abscess  7)-severe anemia-suspecthemolysis in the setting of acute infection-stable -Anemia panelrevealed some iron deficiency and stool occult is negative -2 unit PRBC orderedon 2/12 with stable hemoglobin levelsnoted on repeat CBC -Appreciate GI evaluation with no recommendations for endoscopy at this time -Continue p.o. PPI, -Ferahemegiven during this admission due to iron deficiency  8)-lethargy/depression-----c/n Cymbalta for depression, anxiety and pain issues, stop Remeron due to excessive sedation   DVT prophylaxis:SCDs Code Status:Full code Family Communication:No family at bedside. Disposition Plan:Remains inpatient, continue IV Ancef for total 6 weeks  From first negative cultureas patient is not a candidate for PICC line placement due to  IV drug use. Tele-sitter at bedside.    .  .  Consultants:  Infectious disease over the phone (Dr. Orvan Falconer).  GI Dr. Karilyn Cota  Palliative care over phone  Procedures:  2D echo:  Fraction (55-60%), and no microscopic valve with moderate regurgitation and large vegetation. No wall motion normalities appreciated.  Antimicrobials:  Cefazolin 2/7->   Vancomycin 2/7-2/7      Lab Results  Component Value Date   PLT 198 02/22/2018    Inpatient Medications  Scheduled Meds: . diclofenac  1 patch Transdermal BID  . DULoxetine  30 mg Oral BID  . gabapentin  200 mg Oral TID  . hydrOXYzine  25 mg Oral QHS  . ipratropium-albuterol  3 mL Nebulization Q6H  . methocarbamol  500 mg Oral QID  . metoprolol tartrate  25 mg Oral BID  . multivitamin with minerals  1 tablet Oral Daily  . nicotine  14 mg Transdermal Daily  . pantoprazole  40 mg Oral BID  . senna-docusate  2 tablet Oral BID  . sodium chloride flush  3 mL Intravenous Q12H   Continuous Infusions: . sodium  chloride 50 mL/hr at 02/23/18 0107  .  ceFAZolin (ANCEF) IV 2 g (02/23/18 1603)   PRN Meds:.acetaminophen **OR** acetaminophen, albuterol, guaiFENesin-dextromethorphan, HYDROmorphone, ondansetron **OR** ondansetron (ZOFRAN) IV, oxyCODONE, pentafluoroprop-tetrafluoroeth    Anti-infectives (From admission, onward)   Start     Dose/Rate Route Frequency Ordered Stop   02/09/18 1500  vancomycin (VANCOCIN) IVPB 750 mg/150 ml premix  Status:  Discontinued     750 mg 150 mL/hr over 60 Minutes Intravenous Every 24 hours 02/08/18 1553 02/09/18 1201   02/09/18 1400  ceFAZolin (ANCEF) IVPB 2g/100 mL premix     2 g 200 mL/hr over 30 Minutes Intravenous Every 8 hours 02/09/18 1220 03/22/18 2359   02/09/18 1300  ceFAZolin (ANCEF) 2 g in dextrose 5 % 100 mL IVPB  Status:  Discontinued     2 g 200 mL/hr over 30 Minutes Intravenous Every 8 hours 02/09/18 1201 02/09/18 1220   02/09/18 0000  meropenem (MERREM) 1 g in sodium chloride 0.9 % 100 mL IVPB  Status:  Discontinued     1 g 200 mL/hr over 30 Minutes Intravenous Every 12 hours 02/08/18 1459 02/09/18 0853   02/08/18 1445  vancomycin (VANCOCIN) IVPB 1000 mg/200 mL premix     1,000 mg  200 mL/hr over 60 Minutes Intravenous  Once 02/08/18 1442 02/08/18 1555   02/08/18 1415  meropenem (MERREM) 1 g in sodium chloride 0.9 % 100 mL IVPB     1 g 200 mL/hr over 30 Minutes Intravenous  Once 02/08/18 1402 02/08/18 1455        Objective:   Vitals:   02/23/18 0730 02/23/18 1116 02/23/18 1448 02/23/18 1812  BP:      Pulse:      Resp:      Temp:      TempSrc:      SpO2: 92% 96% 96% 98%  Weight:      Height:        Wt Readings from Last 3 Encounters:  02/23/18 64.2 kg    No intake or output data in the 24 hours ending 02/23/18 1826   Physical Exam Patient is examined daily including today on 02/23/18 , exams remain the same as of yesterday except that has changed   Gen:-No acute distress HEENT:- [place].AT, No sclera icterus Neck-Supple Neck,No  JVD,.  Lungs-diminished with scattered rhonchi especially on left  CV- S1, S2 normal, regular  Abd-  +ve B.Sounds, Abd Soft, No tenderness,    Extremity/Skin:-trace ankle Edema, negative Homans, pedal pulses present  Psych-affect is flat to lethargic, oriented x3 Neuro-generalized weakness without new focal deficits no new focal deficits, no tremors MSK--- lumbar range of motion adequate, palpation over the lumbar spine failed to demonstrate any pinpoint tenderness, step deformity or crepitus on the right flank, overlying skin without significant erythema or inflammatory changes   Data Review:   Micro Results Recent Results (from the past 240 hour(s))  Culture, blood (routine x 2)     Status: None (Preliminary result)   Collection Time: 02/20/18  3:35 PM  Result Value Ref Range Status   Specimen Description BLOOD RIGHT FOREARM  Final   Special Requests   Final    BOTTLES DRAWN AEROBIC AND ANAEROBIC Blood Culture adequate volume   Culture   Final    NO GROWTH 3 DAYS Performed at [facility], [address], [place], [place] [address]    Report Status PENDING  Incomplete  Culture, blood (routine x 2)     Status: None (Preliminary result)   Collection Time: 02/20/18  3:53 PM  Result Value Ref Range Status   Specimen Description LEFT ANTECUBITAL  Final   Special Requests   Final    BOTTLES DRAWN AEROBIC AND ANAEROBIC Blood Culture adequate volume   Culture   Final    NO GROWTH 3 DAYS Performed at [facility], [address], [place], [place] [address]    Report Status PENDING  Incomplete    Radiology Reports Ct Abdomen Pelvis Wo Contrast  Result Date: 02/08/2018 CLINICAL DATA:  Abdominal pain and fever EXAM: CT ABDOMEN AND PELVIS WITHOUT CONTRAST TECHNIQUE: Multidetector CT imaging of the abdomen and pelvis was performed following the standard protocol without oral or IV contrast. COMPARISON:  None. FINDINGS: Lower chest: There is airspace consolidation throughout the lung bases  with several areas of cavitation, likely developing lung abscesses. Several nodular opacities are noted associated with these areas of cavitary consolidation, likely septic emboli. Hepatobiliary: Liver measures 20.5 cm in length. No focal liver lesions are appreciable on this noncontrast enhanced study. The gallbladder is borderline dilated without wall thickening. There is no biliary duct dilatation. Pancreas: No pancreatic mass or inflammatory focus. Spleen: Spleen measures 14.1 x 10.9 x 5.1 cm with a measured splenic volume of  392 cubic cm. No focal splenic lesions are evident. Adrenals/Urinary Tract: Adrenals appear unremarkable bilaterally. 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 No renal mass evident. No hydronephrosis on either side. There is a suspected developing staghorn calculus on the left with somewhat amorphous calcification involving a lower pole calyx on the left extending into the left renal pelvis, best appreciated on coronal imaging. This developing staghorn calculus measures 3.7 x 1.0 cm. No ureteral calculi are evident. Urinary bladder is decompressed. No urinary bladder wall thickening is appreciable with essentially empty bladder. Stomach/Bowel: There is fluid throughout most small bowel loops. There is no appreciable bowel wall or mesenteric thickening. No evident bowel obstruction. No free air or portal venous air is evident. Vascular/Lymphatic: There is no abdominal aortic aneurysm. No vascular lesions are evident on this noncontrast enhanced study. Note that the attenuation throughout the vascular structures appear slightly diminished which raises question of anemia. No adenopathy is appreciable in abdomen or pelvis. Reproductive: Uterus is anteverted. No pelvic masses evident. There is a slight degree of free fluid in the cul-de-sac region. Other: Appendix is not well seen. There is no periappendiceal region inflammation on this study. No abscess is seen in the abdomen or pelvis.  There is no ascites beyond the slight fluid noted in the cul-de-sac region. Musculoskeletal: There are no blastic or lytic bone lesions. No intramuscular or abdominal wall lesions are appreciable. IMPRESSION: Comment: Note that paucity of fat makes assessment in the abdomen and pelvis somewhat less than optimal. 1. Areas of cavitary pneumonia, likely with developing lung abscesses, in the right lower lobe. Areas of consolidation elsewhere with probable septic emboli associated. 2. Prominent liver and spleen. No focal liver and splenic lesions evident. 3. There is evidence of a degree of nephrocalcinosis. Amorphous calcification in a portion of the left renal pelvis and an inferior left calyx suggest developing staghorn calculus. No hydronephrosis on either side. No well-defined ureteral calculi. 4. Fluid in most loops of small bowel. Suspect a degree of ileus or enteritis. No bowel obstruction. No abscess evident in the abdomen or pelvis. 5. Small amount of free fluid in the cul-de-sac may be upper physiologic. Electronically Signed   By: Bretta Bang III M.D.   On: 02/08/2018 14:47   Dg Chest 1 View  Result Date: 02/08/2018 CLINICAL DATA:  Hypotension and tachycardia. Smoking history. Asthma. EXAM: CHEST  1 VIEW COMPARISON:  None. FINDINGS: Heart size is within normal limits. Mild prominence of the LEFT hilum. Patchy opacities bilaterally, most prominent at the RIGHT lung base, majority of which have central lucencies suggest cavitary lesions. IMPRESSION: 1. Patchy opacities bilaterally, majority of which have central lucencies suggest cavitary masses/infections. Corresponding cavitary consolidations noted at the lung bases of a CT abdomen performed at the time of today's chest x-ray. Recommend CT chest with contrast for complete evaluation. 2. Questionable prominence of the LEFT hilum, additional cavitary mass/consolidation versus central bronchiectasis. Electronically Signed   By: Bary Richard M.D.   On:  02/08/2018 14:48   Mr Lumbar Spine W Wo Contrast  Result Date: 02/08/2018 CLINICAL DATA:  Acute onset of back pain beginning 2 days ago. Study was ordered with contrast but the patient refused. EXAM: MRI LUMBAR SPINE WITHOUT CONTRAST TECHNIQUE: Multiplanar, multisequence MR imaging of the lumbar spine was performed. No intravenous contrast was administered. COMPARISON:  None. FINDINGS: Segmentation:  5 lumbar type vertebral bodies. Alignment:  Normal Vertebrae:  Normal Conus medullaris and cauda equina: Conus extends to the L1 level. Conus and cauda equina appear  normal. Paraspinal and other soft tissues: There is lower paraspinous muscle edema as might be seen with a muscular strain. This appears symmetric from right to left. Disc levels: No abnormality at L3-4 or above. L4-5: Very minimal disc bulge.  No stenosis or neural compression. L5-S1: Very minimal disc bulge.  No stenosis or neural compression. IMPRESSION: Minimal disc bulges at L4-5 and L5-S1. No stenosis or neural compression. Paraspinous muscle edema on both sides in the lower lumbar region as might be seen with a muscular strain. This is nonspecific. The differential diagnosis does include infection, but the symmetric nature would be unusual. The patient refused contrast administration. Electronically Signed   By: Paulina Fusi M.D.   On: 02/08/2018 13:56   Dg Chest Port 1 View  Result Date: 02/20/2018 CLINICAL DATA:  Sepsis, endocarditis, cavitary pneumonia with septic emboli and fever. EXAM: PORTABLE CHEST 1 VIEW COMPARISON:  02/08/2018 FINDINGS: Multiple cavitary lesions again identified in both lungs with associated airspace disease in a pattern likely representing septic emboli with multiple areas of cavitary infection. There is a new left lateral pleural effusion which appears likely loculated and may represent an empyema. Further evaluation with CT of the chest with contrast may be helpful. The heart size is stable. IMPRESSION: Persistent  appearance of bilateral cavitary pneumonia likely representing septic emboli. There is a new loculated left lateral pleural effusion that may represent a developing empyema. Further evaluation with CT of the chest with contrast may be helpful. Electronically Signed   By: Irish Lack M.D.   On: 02/20/2018 19:11     CBC Recent Labs  Lab 02/18/18 0940 02/20/18 0932 02/22/18 0941  WBC 21.9* 13.8* 12.3*  HGB 10.0* 8.3* 8.0*  HCT 33.6* 27.5* 26.9*  PLT 123* 151 198  MCV 87.7 85.9 87.6  MCH 26.1 25.9* 26.1  MCHC 29.8* 30.2 29.7*  RDW 20.6* 20.4* 20.9*    Chemistries  Recent Labs  Lab 02/18/18 0940 02/20/18 0932 02/22/18 0941  NA 130* 134* 134*  K 4.7 4.2 4.1  CL 103 103 102  CO2 18* 23 23  GLUCOSE 103* 99 111*  BUN 8 <5* 5*  CREATININE 0.63 0.44 0.47  CALCIUM 7.4* 7.4* 7.6*  MG 1.7  --   --   AST 70* 50*  --   ALT 21 17  --   ALKPHOS 100 83  --   BILITOT 0.7 0.3  --    ------------------------------------------------------------------------------------------------------------------ No results for input(s): CHOL, HDL, LDLCALC, TRIG, CHOLHDL, LDLDIRECT in the last 72 hours.  No results found for: HGBA1C ------------------------------------------------------------------------------------------------------------------ No results for input(s): TSH, T4TOTAL, T3FREE, THYROIDAB in the last 72 hours.  Invalid input(s): FREET3 ------------------------------------------------------------------------------------------------------------------ No results for input(s): VITAMINB12, FOLATE, FERRITIN, TIBC, IRON, RETICCTPCT in the last 72 hours.  Coagulation profile No results for input(s): INR, PROTIME in the last 168 hours.  No results for input(s): DDIMER in the last 72 hours.  Cardiac Enzymes No results for input(s): CKMB, TROPONINI, MYOGLOBIN in the last 168 hours.  Invalid input(s):  CK ------------------------------------------------------------------------------------------------------------------ No results found for: BNP   Shon Hale M.D on 02/23/2018 at 6:26 PM  Go to [email or web address] - for contact info  [facility] - Office  [phone]

## 2018-02-23 NOTE — Progress Notes (Signed)
Pharmacy Antibiotic Note  Amy Mcknight is a 23 y.o. female admitted on 02/08/2018 with bacteremia.  Pharmacy has been consulted for Cefazolin dosing. MSSA bacteremia complicated by tricuspid vegetation and cavitary pneumonia.and lumbar paravertebral myositis. She continues with low grade temps. ID recommends 6 weeks IV antibiotics in a supervised setting from 2/10 because of her active injecting drug use.  Plan: Continue Cefazolin 2gm IV q8h F/U cxs and clinical progress Monitor V/S and labs  Height: 5\' 3"  (160 cm) Weight: 141 lb 8.6 oz (64.2 kg) IBW/kg (Calculated) : 52.4  Temp (24hrs), Avg:98.2 F (36.8 C), Min:97.4 F (36.3 C), Max:98.6 F (37 C)  Recent Labs  Lab 02/17/18 1703 02/18/18 0940 02/20/18 0932 02/22/18 0941  WBC  --  21.9* 13.8* 12.3*  CREATININE  --  0.63 0.44 0.47  LATICACIDVEN 1.2  --   --   --     Estimated Creatinine Clearance: 97.7 mL/min (by C-G formula based on SCr of 0.47 mg/dL).    Allergies  Allergen Reactions  . Penicillins Other (See Comments)    Diaper rash as an infant per mother    Antimicrobials this admission: Cefazolin 2/7>> Merrem 2/6 >> 2/7 Vanco 2/6 >>2/7  Dose adjustments this admission: N/A  Microbiology results: 2/6 BCx: MSSA s- oxacillin, TCN, vanco  R-clindamycin 2/9 BCx. NGTD 2/6 UCx: >100K CFU/ml MSSA  s- oxacillin  r-clindamycin 2/6 MRSA PCR is negative hepatitis C antibody was positive   Thank you for allowing pharmacy to be a part of this patient's care.  Judeth Cornfield, PharmD Clinical Pharmacist 02/23/2018 12:19 PM

## 2018-02-24 ENCOUNTER — Inpatient Hospital Stay (HOSPITAL_COMMUNITY): Payer: Self-pay

## 2018-02-24 DIAGNOSIS — E43 Unspecified severe protein-calorie malnutrition: Secondary | ICD-10-CM

## 2018-02-24 LAB — URINALYSIS, ROUTINE W REFLEX MICROSCOPIC
Bilirubin Urine: NEGATIVE
GLUCOSE, UA: NEGATIVE mg/dL
Ketones, ur: NEGATIVE mg/dL
Nitrite: NEGATIVE
Protein, ur: 30 mg/dL — AB
Specific Gravity, Urine: 1.02 (ref 1.005–1.030)
pH: 5 (ref 5.0–8.0)

## 2018-02-24 LAB — RAPID URINE DRUG SCREEN, HOSP PERFORMED
Amphetamines: NOT DETECTED
BARBITURATES: NOT DETECTED
Benzodiazepines: NOT DETECTED
COCAINE: NOT DETECTED
Opiates: POSITIVE — AB
TETRAHYDROCANNABINOL: NOT DETECTED

## 2018-02-24 MED ORDER — METHOCARBAMOL 500 MG PO TABS
750.0000 mg | ORAL_TABLET | Freq: Four times a day (QID) | ORAL | Status: DC
  Administered 2018-02-24 – 2018-03-19 (×87): 750 mg via ORAL
  Filled 2018-02-24 (×89): qty 2

## 2018-02-24 MED ORDER — GABAPENTIN 300 MG PO CAPS
300.0000 mg | ORAL_CAPSULE | Freq: Three times a day (TID) | ORAL | Status: DC
  Administered 2018-02-24 – 2018-03-19 (×68): 300 mg via ORAL
  Filled 2018-02-24 (×69): qty 1

## 2018-02-24 MED ORDER — NICOTINE 7 MG/24HR TD PT24
7.0000 mg | MEDICATED_PATCH | Freq: Every day | TRANSDERMAL | Status: DC
  Administered 2018-02-25 – 2018-02-26 (×2): 7 mg via TRANSDERMAL
  Filled 2018-02-24 (×6): qty 1

## 2018-02-24 NOTE — Progress Notes (Signed)
Able to have a conversation with patient regarding her diagnosis. Pt states "I am confused about what is going on with me because no one has explained for me to understand." Boyfriend at bedside and pt in agreeance with explanation in his presence. Explained to pt cavitary pneumonia and endocarditis and the effect of both of these diseases on the body. This nurse explained in depth with drawings on the white board and laymen's terms in order for pt to understand. Pt receptive of education given responding "ok, now I see what's going on because no one has taken the time to tell me this stuff." During this conversation pt says "but I came in here because my leg was hurting and they are not even treating it." Explained to pt ample pain management regimen at this time. Pt responds "Oh, ok." Explained to pt that lumbar spine MRI was obtained on 02/08/2018 and noted abnormalities. Took this time to also encourage pt to increase activity with each day. Also able to educate pt using  pt engagement and education system via TV in order for pt to better understand her current ordered medications and decrease confusion. Will continue to monitor and educate as necessary.

## 2018-02-24 NOTE — Progress Notes (Signed)
PT Cancellation Note  Patient Details Name: Amy Mcknight MRN: [id number] DOB: November 21, 1995   Cancelled Treatment:    Reason Eval/Treat Not Completed: Patient declined, no reason specified.  Patient declined therapy secondary to fatigue and states she has been walking to bathroom - RN notified and confirmed that she has been walking patient to bathroom.   12:08 PM, 02/24/18 Ocie Bob, MPT Physical Therapist with [facility] [phone] office [phone] mobile phone

## 2018-02-24 NOTE — Progress Notes (Signed)
Pt up to bedside commode, urine sample given. Urine drug screen sent to lab. Pt requesting call to Dr. Marisa Severin for pain meds after urine sample given.

## 2018-02-24 NOTE — Progress Notes (Signed)
Patient is still breathing at a very fast shallow rate almost 40 times a minute. Have placed her on 2 lpm/[place] her saturation has increased from 94 on room air to 98. He heart rate is 120's. RT question her if it made her feel better when she took neb treatment and Suspect its the oxygen in neb not the albuterol. She is not wheezing but she does have some noise in her upper lungs.

## 2018-02-24 NOTE — Progress Notes (Addendum)
Patient appears to be breathing in the 40's while taking her neb.  Not sure is she has been doing this. She is hard to deal with as she has been refusing labs Mainly blood draws. Her last X-RAY was on 18 and was worse than Feb 6. She had a 17 on CO2 on 17 on feb which most like means a hyper ventilation going on. Breath sounds do not appear with wheezes though cough is congested. Sat was 96 with neb. Will continue to watch for signs of Respiratory compromise.

## 2018-02-24 NOTE — Progress Notes (Signed)
Called to the room per [facility] tech; pt was noted to be placing something in her nose. Prior to incident pt's boyfriend entered the room. Entered pt room to find pt sitting in bed, boyfriend at bedside. Notified security, AC - T. Goins, RN, Ashok Croon RN, [place] police and Dr. Marisa Severin due to incident. Pt stated, "I was demonstrating how a friend snorts". Pt was educated on the seriousness of her condition and for the treatment needed to stay healthy. Dr. Marisa Severin aware.

## 2018-02-24 NOTE — Progress Notes (Signed)
Amy Mcknight Demographics:    Amy Mcknight, is a 23 y.o. female, DOB - [birth year], ZOX:[id number]  Admit date - 02/08/2018   Admitting Physician Charlsie Quest, MD  Outpatient Primary MD for the Amy Mcknight is Amy Mcknight, No Pcp Per  LOS - 16   No chief complaint on file.       Subjective:    Eddie Koc today has no fevers, no emesis,  No chest pain, I was Called to the room per [facility] tech; pt was noted to be placing something in her nose on the telemetry sitter- monitor. Prior to incident pt's boyfriend entered the room. We Entered pt room to find pt sitting in bed, boyfriend at bedside. Notified security, AC - T. Goins, RN, Ashok Croon RN, [place] police ----Pt stated, "I was demonstrating how a friend snorts".....  Amy Mcknight boyfriend told myself and the police officer that the Amy Mcknight asked him to hand over her purse----she then proceeded to open the patch, rolled a dollar bill and then got something from a bag inside the purse and acted like she was snorting it into her nose----Amy Mcknight admits to doing this but she states she was only trying to demonstrate how 1 of their friends like to snort drugs..... She insists that there was nothing in the dollar bill that she had rolled up--- requested UA for UDS, Amy Mcknight tells me she cannot give UA at this time, hours later Amy Mcknight still states she cannot give a urine sample   Assessment  & Plan :    Principal Problem:   MSSA bacteremia Active Problems:   Cavitary pneumonia   AKI (acute kidney injury) (HCC)   Substance use disorder   Hyponatremia   IVDU (intravenous drug user)   Myositis   Normocytic anemia   Thrombocytopenia (HCC)   Cigarette smoker   Hepatitis C antibody test positive   Endocarditis of tricuspid valve   Protein-calorie malnutrition, severe  Brief Narrative:  23 y.o.femalewith medical history significant forsubstance use disorderwho presents  to the ED with 2 days of right lower back pain. Amy Mcknight states symptoms began with acute onset while at rest. Symptoms have been persistent. She has noted associated diaphoresis, chills, shortness of breath, cough productive of brown sputum, and right chest wall pain. She denies any rashes or obvious skin changes. She reports good urine output without dysuria. She denies any abdominal pain, diarrhea, or constipation.  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 She continues to use recreational drugs by snorting nasally. She denies any alcohol use. She reports a history of withdrawal from opiates in the past.And is noted to be positive for marijuana and opiates on her UDS.  She is noted to be anemicand is status post 2 unit PRBC transfusion with improvement noted. No overt bleeding currently identified, but Amy Mcknight is complaining of some epigastric abdominal pain. As result of this pain, she has not been eating very much. She denies any nausea or vomiting.GI has evaluated Amy Mcknight with no need for endoscopy noted at this time.  she refuses bathing and laboratory draws.  She has been using a bedpan and has not been eating very well.  She has been more somnolent from time to time possibly due to oversedation   Plan:- 1)Sepsis  with early septic shock in the setting of MSSA bacteremia, tricuspid vegetation, paravertebral myositis and cavitary pneumonia--- sepsis pathophysiology and septic shock appears to have resolved   -Pneumonia lesions could also represent septic emboli-------Case discussed previously with infectious disease doctor who has recommended continue  IV Ancef for total of 6 weeks from date of first negative culture which was 02/12/2018 in a supervised setting (IVDU) -2D echo positive for tricuspid vegetation and moderate regurgitation.  No further fevers , repeat blood cultures from 02/20/2018 Negative to date, WBC is down to 12.3 from 21.9  2)-AKI (acute  kidney injury) (HCC)-resolved---, continue to encourage adequate oral intake  3)-Substance use disorder/opiate withdrawal syndrome-- -  Amy Mcknight continues to ask for extra doses of narcotics, for the most part she appears to be resting comfortably for extended/long periods of time without any evidence of pain, we will Continue adjusted wean down narcotic dosage and frequency,  increase  methocarbamol   4 times daily, Cymbalta 30 mg daily and gabapentin 300 mg 3 times daily to help blunt withdrawal symptoms, c/n hydroxyzine nightly  ???  Amy Mcknight was using illegal substances/snorting drugs in the room on 02/24/2018 please see subjective section above----requested UA for UDS, Amy Mcknight tells me she cannot give UA at this time, hours later Amy Mcknight still states she cannot give a urine sample   4)Tobacco Abuse---Extensive cessation counseling provided--continue nicotine patch   5)positive hepatitis C antibody -Quantitative viral load test undetectable;-No treatment needed , outpatient follow-up with ID/gastroenterology advised  6)Generalized weakness and debility----continues to refuse from time to time to work with physical therapy, Amy Mcknight continues to refuse to participate in ADLs often, wanting to have staff do more and more for her--- encourage to participate more in ADLs and physical therapy, consider repeat lumbar MRI with contrast if increased back pain or further concerns about possible epidural abscess  7)-severe anemia-suspecthemolysis in the setting of acute infection-stable -Anemia panelrevealed some iron deficiency and stool occult is negative -2 unit PRBC orderedon 2/12 with stable hemoglobin levelsnoted on repeat CBC -Appreciate GI evaluation with no recommendations for endoscopy at this time -Continue p.o. PPI, -Ferahemegiven during this admission due to iron deficiency  8)Lethargy/Depression-----c/n Cymbalta for depression, anxiety and pain issues, stop Remeron due to  excessive sedation   DVT prophylaxis:SCDs Code Status:Full code Family Communication:No family at bedside. Disposition Plan:Remains inpatient, continue IV Ancef for total 6 weeks  From first negative cultureas Amy Mcknight is not a candidate for PICC line placement due to  IV drug use. Tele-sitter at bedside.    .  .  Consultants:  Infectious disease over the phone (Dr. Orvan Falconer).  GI Dr. Karilyn Cota  Palliative care over phone  Procedures:  2D echo:  Fraction (55-60%), and no microscopic valve with moderate regurgitation and large vegetation. No wall motion normalities appreciated.  Antimicrobials:  Cefazolin 2/7->   Vancomycin 2/7-2/7     Lab Results  Component Value Date   PLT 198 02/22/2018    Inpatient Medications  Scheduled Meds: . diclofenac  1 patch Transdermal BID  . DULoxetine  30 mg Oral BID  . gabapentin  300 mg Oral TID  . hydrOXYzine  25 mg Oral QHS  . ipratropium-albuterol  3 mL Nebulization Q6H  . methocarbamol  750 mg Oral QID  . metoprolol tartrate  25 mg Oral BID  . multivitamin with minerals  1 tablet Oral Daily  . [START ON 02/25/2018] nicotine  7 mg Transdermal Daily  . pantoprazole  40 mg Oral BID  . senna-docusate  2  tablet Oral BID  . sodium chloride flush  3 mL Intravenous Q12H   Continuous Infusions: . sodium chloride 50 mL/hr at 02/24/18 0056  .  ceFAZolin (ANCEF) IV 2 g (02/24/18 1627)   PRN Meds:.acetaminophen **OR** acetaminophen, albuterol, guaiFENesin-dextromethorphan, ondansetron **OR** ondansetron (ZOFRAN) IV, oxyCODONE, pentafluoroprop-tetrafluoroeth    Anti-infectives (From admission, onward)   Start     Dose/Rate Route Frequency Ordered Stop   02/09/18 1500  vancomycin (VANCOCIN) IVPB 750 mg/150 ml premix  Status:  Discontinued     750 mg 150 mL/hr over 60 Minutes Intravenous Every 24 hours 02/08/18 1553 02/09/18 1201   02/09/18 1400  ceFAZolin (ANCEF) IVPB 2g/100 mL premix     2 g 200 mL/hr over 30 Minutes  Intravenous Every 8 hours 02/09/18 1220 03/22/18 2359   02/09/18 1300  ceFAZolin (ANCEF) 2 g in dextrose 5 % 100 mL IVPB  Status:  Discontinued     2 g 200 mL/hr over 30 Minutes Intravenous Every 8 hours 02/09/18 1201 02/09/18 1220   02/09/18 0000  meropenem (MERREM) 1 g in sodium chloride 0.9 % 100 mL IVPB  Status:  Discontinued     1 g 200 mL/hr over 30 Minutes Intravenous Every 12 hours 02/08/18 1459 02/09/18 0853   02/08/18 1445  vancomycin (VANCOCIN) IVPB 1000 mg/200 mL premix     1,000 mg 200 mL/hr over 60 Minutes Intravenous  Once 02/08/18 1442 02/08/18 1555   02/08/18 1415  meropenem (MERREM) 1 g in sodium chloride 0.9 % 100 mL IVPB     1 g 200 mL/hr over 30 Minutes Intravenous  Once 02/08/18 1402 02/08/18 1455        Objective:   Vitals:   02/24/18 0559 02/24/18 0609 02/24/18 0620 02/24/18 1140  BP:      Pulse:   (!) 124   Resp:   (!) 36   Temp:      TempSrc:      SpO2:  94% 99% 99%  Weight: 67.4 kg     Height:        Wt Readings from Last 3 Encounters:  02/24/18 67.4 kg     Intake/Output Summary (Last 24 hours) at 02/24/2018 1659 Last data filed at 02/23/2018 2200 Gross per 24 hour  Intake 240 ml  Output -  Net 240 ml     Physical Exam Amy Mcknight is examined daily including today on 02/24/18 , exams remain the same as of yesterday except that has changed   Gen:-Awake, no acute distress HEENT:- [place].AT, No sclera icterus Neck-Supple Neck,No JVD,.  Lungs-diminished with scattered rhonchi especially on left, no wheezing CV- S1, S2 normal, regular , tachycardic Abd-  +ve B.Sounds, Abd Soft, No tenderness,    Extremity/Skin:-trace ankle Edema, negative Homans, pedal pulses present  Psych-affect is flat to lethargic, oriented x3 Neuro-generalized weakness without new focal deficits no new focal deficits, no tremors MSK--- lumbar range of motion adequate, palpation over the lumbar spine failed to demonstrate any pinpoint tenderness, step deformity or crepitus on the  right flank, overlying skin without significant erythema or inflammatory changes   Data Review:   Micro Results Recent Results (from the past 240 hour(s))  Culture, blood (routine x 2)     Status: None (Preliminary result)   Collection Time: 02/20/18  3:35 PM  Result Value Ref Range Status   Specimen Description BLOOD RIGHT FOREARM  Final   Special Requests   Final    BOTTLES DRAWN AEROBIC AND ANAEROBIC Blood Culture adequate volume  Culture   Final    NO GROWTH 4 DAYS Performed at [facility], [address], [place], [place] [address]    Report Status PENDING  Incomplete  Culture, blood (routine x 2)     Status: None (Preliminary result)   Collection Time: 02/20/18  3:53 PM  Result Value Ref Range Status   Specimen Description LEFT ANTECUBITAL  Final   Special Requests   Final    BOTTLES DRAWN AEROBIC AND ANAEROBIC Blood Culture adequate volume   Culture   Final    NO GROWTH 4 DAYS Performed at [facility], [address] [place], [place] [address]    Report Status PENDING  Incomplete    Radiology Reports Ct Abdomen Pelvis Wo Contrast  Result Date: 02/08/2018 CLINICAL DATA:  Abdominal pain and fever EXAM: CT ABDOMEN AND PELVIS WITHOUT CONTRAST TECHNIQUE: Multidetector CT imaging of the abdomen and pelvis was performed following the standard protocol without oral or IV contrast. COMPARISON:  None. FINDINGS: Lower chest: There is airspace consolidation throughout the lung bases with several areas of cavitation, likely developing lung abscesses. Several nodular opacities are noted associated with these areas of cavitary consolidation, likely septic emboli. Hepatobiliary: Liver measures 20.5 cm in length. No focal liver lesions are appreciable on this noncontrast enhanced study. The gallbladder is borderline dilated without wall thickening. There is no biliary duct dilatation. Pancreas: No pancreatic mass or inflammatory focus. Spleen: Spleen measures 14.1 x 10.9 x 5.1 cm with a  measured splenic volume of 392 cubic cm. No focal splenic lesions are evident. Adrenals/Urinary Tract: Adrenals appear unremarkable bilaterally. 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 No renal mass evident. No hydronephrosis on either side. There is a suspected developing staghorn calculus on the left with somewhat amorphous calcification involving a lower pole calyx on the left extending into the left renal pelvis, best appreciated on coronal imaging. This developing staghorn calculus measures 3.7 x 1.0 cm. No ureteral calculi are evident. Urinary bladder is decompressed. No urinary bladder wall thickening is appreciable with essentially empty bladder. Stomach/Bowel: There is fluid throughout most small bowel loops. There is no appreciable bowel wall or mesenteric thickening. No evident bowel obstruction. No free air or portal venous air is evident. Vascular/Lymphatic: There is no abdominal aortic aneurysm. No vascular lesions are evident on this noncontrast enhanced study. Note that the attenuation throughout the vascular structures appear slightly diminished which raises question of anemia. No adenopathy is appreciable in abdomen or pelvis. Reproductive: Uterus is anteverted. No pelvic masses evident. There is a slight degree of free fluid in the cul-de-sac region. Other: Appendix is not well seen. There is no periappendiceal region inflammation on this study. No abscess is seen in the abdomen or pelvis. There is no ascites beyond the slight fluid noted in the cul-de-sac region. Musculoskeletal: There are no blastic or lytic bone lesions. No intramuscular or abdominal wall lesions are appreciable. IMPRESSION: Comment: Note that paucity of fat makes assessment in the abdomen and pelvis somewhat less than optimal. 1. Areas of cavitary pneumonia, likely with developing lung abscesses, in the right lower lobe. Areas of consolidation elsewhere with probable septic emboli associated. 2. Prominent liver and spleen.  No focal liver and splenic lesions evident. 3. There is evidence of a degree of nephrocalcinosis. Amorphous calcification in a portion of the left renal pelvis and an inferior left calyx suggest developing staghorn calculus. No hydronephrosis on either side. No well-defined ureteral calculi. 4. Fluid in most loops of small bowel. Suspect a degree of ileus  or enteritis. No bowel obstruction. No abscess evident in the abdomen or pelvis. 5. Small amount of free fluid in the cul-de-sac may be upper physiologic. Electronically Signed   By: Bretta BangWilliam  Woodruff III M.D.   On: 02/08/2018 14:47   Dg Chest 1 View  Result Date: 02/08/2018 CLINICAL DATA:  Hypotension and tachycardia. Smoking history. Asthma. EXAM: CHEST  1 VIEW COMPARISON:  None. FINDINGS: Heart size is within normal limits. Mild prominence of the LEFT hilum. Patchy opacities bilaterally, most prominent at the RIGHT lung base, majority of which have central lucencies suggest cavitary lesions. IMPRESSION: 1. Patchy opacities bilaterally, majority of which have central lucencies suggest cavitary masses/infections. Corresponding cavitary consolidations noted at the lung bases of a CT abdomen performed at the time of today's chest x-ray. Recommend CT chest with contrast for complete evaluation. 2. Questionable prominence of the LEFT hilum, additional cavitary mass/consolidation versus central bronchiectasis. Electronically Signed   By: Bary RichardStan  Maynard M.D.   On: 02/08/2018 14:48   Dg Chest 2 View  Result Date: 02/24/2018 CLINICAL DATA:  Short of breath EXAM: CHEST - 2 VIEW COMPARISON:  02/20/2018 FINDINGS: Upper normal heart size. Stable left pleural effusion. Tiny right pleural effusion is stable. Patchy airspace opacities throughout both lungs are not significantly changed. No pneumothorax. IMPRESSION: Bilateral airspace disease and bilateral pleural effusions left greater than right are stable. Electronically Signed   By: Jolaine ClickArthur  Hoss M.D.   On: 02/24/2018  15:50   Mr Lumbar Spine W Wo Contrast  Result Date: 02/08/2018 CLINICAL DATA:  Acute onset of back pain beginning 2 days ago. Study was ordered with contrast but the Amy Mcknight refused. EXAM: MRI LUMBAR SPINE WITHOUT CONTRAST TECHNIQUE: Multiplanar, multisequence MR imaging of the lumbar spine was performed. No intravenous contrast was administered. COMPARISON:  None. FINDINGS: Segmentation:  5 lumbar type vertebral bodies. Alignment:  Normal Vertebrae:  Normal Conus medullaris and cauda equina: Conus extends to the L1 level. Conus and cauda equina appear normal. Paraspinal and other soft tissues: There is lower paraspinous muscle edema as might be seen with a muscular strain. This appears symmetric from right to left. Disc levels: No abnormality at L3-4 or above. L4-5: Very minimal disc bulge.  No stenosis or neural compression. L5-S1: Very minimal disc bulge.  No stenosis or neural compression. IMPRESSION: Minimal disc bulges at L4-5 and L5-S1. No stenosis or neural compression. Paraspinous muscle edema on both sides in the lower lumbar region as might be seen with a muscular strain. This is nonspecific. The differential diagnosis does include infection, but the symmetric nature would be unusual. The Amy Mcknight refused contrast administration. Electronically Signed   By: Paulina FusiMark  Shogry M.D.   On: 02/08/2018 13:56   Dg Chest Port 1 View  Result Date: 02/20/2018 CLINICAL DATA:  Sepsis, endocarditis, cavitary pneumonia with septic emboli and fever. EXAM: PORTABLE CHEST 1 VIEW COMPARISON:  02/08/2018 FINDINGS: Multiple cavitary lesions again identified in both lungs with associated airspace disease in a pattern likely representing septic emboli with multiple areas of cavitary infection. There is a new left lateral pleural effusion which appears likely loculated and may represent an empyema. Further evaluation with CT of the chest with contrast may be helpful. The heart size is stable. IMPRESSION: Persistent appearance  of bilateral cavitary pneumonia likely representing septic emboli. There is a new loculated left lateral pleural effusion that may represent a developing empyema. Further evaluation with CT of the chest with contrast may be helpful. Electronically Signed   By: Rudene AndaGlenn  Yamagata M.D.  On: 02/20/2018 19:11     CBC Recent Labs  Lab 02/18/18 0940 02/20/18 0932 02/22/18 0941  WBC 21.9* 13.8* 12.3*  HGB 10.0* 8.3* 8.0*  HCT 33.6* 27.5* 26.9*  PLT 123* 151 198  MCV 87.7 85.9 87.6  MCH 26.1 25.9* 26.1  MCHC 29.8* 30.2 29.7*  RDW 20.6* 20.4* 20.9*    Chemistries  Recent Labs  Lab 02/18/18 0940 02/20/18 0932 02/22/18 0941  NA 130* 134* 134*  K 4.7 4.2 4.1  CL 103 103 102  CO2 18* 23 23  GLUCOSE 103* 99 111*  BUN 8 <5* 5*  CREATININE 0.63 0.44 0.47  CALCIUM 7.4* 7.4* 7.6*  MG 1.7  --   --   AST 70* 50*  --   ALT 21 17  --   ALKPHOS 100 83  --   BILITOT 0.7 0.3  --     ------------------------------------------------------------------------------------------------------------------ No results found for: BNP   Shon Hale M.D on 02/24/2018 at 4:59 PM  Go to [email or web address] - for contact info  [facility] - Office  [phone]

## 2018-02-25 LAB — BASIC METABOLIC PANEL
Anion gap: 7 (ref 5–15)
BUN: 6 mg/dL (ref 6–20)
CO2: 25 mmol/L (ref 22–32)
Calcium: 8.2 mg/dL — ABNORMAL LOW (ref 8.9–10.3)
Chloride: 105 mmol/L (ref 98–111)
Creatinine, Ser: 0.63 mg/dL (ref 0.44–1.00)
GFR calc Af Amer: >60 mL/min (ref >60)
GFR calc non Af Amer: >60 mL/min (ref >60)
Glucose, Bld: 100 mg/dL — ABNORMAL HIGH (ref 70–99)
Potassium: 3.8 mmol/L (ref 3.5–5.1)
Sodium: 137 mmol/L (ref 135–145)

## 2018-02-25 LAB — CBC
HCT: 33.3 % — ABNORMAL LOW (ref 36.0–46.0)
Hemoglobin: 9.8 g/dL — ABNORMAL LOW (ref 12.0–15.0)
MCH: 26.5 pg (ref 26.0–34.0)
MCHC: 29.4 g/dL — ABNORMAL LOW (ref 30.0–36.0)
MCV: 90 fL (ref 80.0–100.0)
Platelets: 300 10*3/uL (ref 150–400)
RBC: 3.7 MIL/uL — ABNORMAL LOW (ref 3.87–5.11)
RDW: 20.4 % — ABNORMAL HIGH (ref 11.5–15.5)
WBC: 11.1 10*3/uL — ABNORMAL HIGH (ref 4.0–10.5)
nRBC: 0 % (ref 0.0–0.2)

## 2018-02-25 LAB — CULTURE, BLOOD (ROUTINE X 2)
Culture: NO GROWTH
Culture: NO GROWTH
SPECIAL REQUESTS: ADEQUATE
Special Requests: ADEQUATE

## 2018-02-25 MED ORDER — METOPROLOL TARTRATE 25 MG PO TABS
37.5000 mg | ORAL_TABLET | Freq: Two times a day (BID) | ORAL | Status: DC
  Administered 2018-02-25: 37.5 mg via ORAL
  Filled 2018-02-25: qty 2

## 2018-02-25 MED ORDER — METOPROLOL TARTRATE 50 MG PO TABS
50.0000 mg | ORAL_TABLET | Freq: Two times a day (BID) | ORAL | Status: DC
  Administered 2018-02-25 – 2018-03-01 (×8): 50 mg via ORAL
  Filled 2018-02-25 (×8): qty 1

## 2018-02-25 NOTE — Progress Notes (Signed)
Physical Therapy Treatment Patient Details Name: Amy Mcknight MRN: [id number] DOB: [birth year] Today's Date: 02/25/2018    History of Present Illness Amy Mcknight is a 23 y.o. female with medical history significant for substance use disorder who presents to the ED with 2 days of right lower back pain.  Patient states symptoms began with acute onset while at rest.  Symptoms have been persistent.  She has noted associated diaphoresis, chills, shortness of breath, cough productive of brown sputum, and right chest wall pain.  She denies any rashes or obvious skin changes.  She reports good urine output without dysuria.  She denies any abdominal pain, diarrhea, or constipation. 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  She continues to use recreational drugs by snorting nasally.  She denies any alcohol use.  She reports a history of withdrawal from opiates in the past.    PT Comments    Patient apprehensive to attempt therapy due to c/o low back pain and fatigue, presents on room air with O2 saturation at 85%, put on 2 LPM for physical therapy.  Patient demonstrates slow labored movement for sitting up at bedside and transferring to chair requesting assistance, limited to a few steps in room due to c/o increasing right sided low back pain with possible radiation down RLE.  Patient tolerated sitting up in chair after therapy with encouragement - nursing staff notified.  Patient will benefit from continued physical therapy in hospital and recommended venue below to increase strength, balance, endurance for safe ADLs and gait.    Follow Up Recommendations  Supervision - Intermittent;Supervision for mobility/OOB;SNF     Equipment Recommendations  Rolling walker with 5" wheels    Recommendations for Other Services       Precautions / Restrictions Precautions Precautions: Fall Precaution Comments: monitor O2 saturation Restrictions Weight Bearing Restrictions: No    Mobility  Bed  Mobility Overal bed mobility: Needs Assistance Bed Mobility: Supine to Sit     Supine to sit: Min assist;Mod assist     General bed mobility comments: increased time, slow labored movement  Transfers Overall transfer level: Needs assistance Equipment used: Rolling walker (2 wheeled);1 person hand held assist Transfers: Sit to/from [employer] Sit to Stand: Min assist Stand pivot transfers: Min assist;Mod assist       General transfer comment: has to lean on armrest of chair during stand pivot transfers, safer using RW  Ambulation/Gait Ambulation/Gait assistance: Min assist Gait Distance (Feet): 12 Feet Assistive device: Rolling walker (2 wheeled) Gait Pattern/deviations: Decreased step length - right;Decreased step length - left;Decreased stride length Gait velocity: decreased   General Gait Details: slow unsteady labored cadence, mostly limited due to c/o severe pain right side lower back and RLE   Stairs             Wheelchair Mobility    Modified Rankin (Stroke Patients Only)       Balance Overall balance assessment: Needs assistance Sitting-balance support: Feet supported;No upper extremity supported Sitting balance-Leahy Scale: Fair     Standing balance support: No upper extremity supported;During functional activity Standing balance-Leahy Scale: Poor Standing balance comment: fair/poor using RW                            Cognition Arousal/Alertness: Awake/alert Behavior During Therapy: Agitated Overall Cognitive Status: Within Functional Limits for tasks assessed  Exercises      General Comments        Pertinent Vitals/Pain Pain Assessment: Faces Faces Pain Scale: Hurts even more Pain Location: right side low back and RLE Pain Descriptors / Indicators: Shooting;Sharp;Grimacing Pain Intervention(s): Limited activity within patient's tolerance;Monitored  during session;Patient requesting pain meds-RN notified    Home Living                      Prior Function            PT Goals (current goals can now be found in the care plan section) Acute Rehab PT Goals Patient Stated Goal: return home PT Goal Formulation: With patient Time For Goal Achievement: 03/02/18 Potential to Achieve Goals: Fair Progress towards PT goals: Progressing toward goals    Frequency    Min 2X/week      PT Plan Current plan remains appropriate    Co-evaluation              AM-PAC PT "6 Clicks" Mobility   Outcome Measure  Help needed turning from your back to your side while in a flat bed without using bedrails?: A Lot Help needed moving from lying on your back to sitting on the side of a flat bed without using bedrails?: A Lot Help needed moving to and from a bed to a chair (including a wheelchair)?: A Lot Help needed standing up from a chair using your arms (e.g., wheelchair or bedside chair)?: A Lot Help needed to walk in hospital room?: A Lot Help needed climbing 3-5 steps with a railing? : A Lot 6 Click Score: 12    End of Session Equipment Utilized During Treatment: Gait belt;Oxygen Activity Tolerance: Patient tolerated treatment well;Patient limited by fatigue;Patient limited by pain Patient left: in chair;with call bell/phone within reach Nurse Communication: Mobility status PT Visit Diagnosis: Unsteadiness on feet (R26.81);Other abnormalities of gait and mobility (R26.89);Muscle weakness (generalized) (M62.81)     Time: 1950-9326 PT Time Calculation (min) (ACUTE ONLY): 27 min  Charges:  $Therapeutic Activity: 23-37 mins                     12:23 PM, 02/25/18 Ocie Bob, MPT Physical Therapist with [facility] [phone] office [phone] mobile phone

## 2018-02-25 NOTE — Progress Notes (Signed)
Patient Demographics:    Amy Mcknight, is a 23 y.o. female, DOB - 08-May-1995, ZOX:[id number]  Admit date - 02/08/2018   Admitting Physician Charlsie Quest, MD  Outpatient Primary MD for the patient is Patient, No Pcp Per  LOS - 17   No chief complaint on file.       Subjective:    Amy Mcknight today has no fevers, no emesis,  No chest pain, complains of back pain, No chills, not eating well, reluctant to get out of bed or participate in ADLs   Assessment  & Plan :    Principal Problem:   MSSA bacteremia Active Problems:   Cavitary pneumonia   AKI (acute kidney injury) (HCC)   Substance use disorder   Hyponatremia   IVDU (intravenous drug user)   Myositis   Normocytic anemia   Thrombocytopenia (HCC)   Cigarette smoker   Hepatitis C antibody test positive   Endocarditis of tricuspid valve   Protein-calorie malnutrition, severe  Brief Narrative:  23 y.o.femalewith medical history significant forsubstance use disorderwho presents to the ED with 2 days of right lower back pain. Patient states symptoms began with acute onset while at rest. Symptoms have been persistent. She has noted associated diaphoresis, chills, shortness of breath, cough productive of brown sputum, and right chest wall pain. She denies any rashes or obvious skin changes. She reports good urine output without dysuria. She denies any abdominal pain, diarrhea, or constipation.  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 She continues to use recreational drugs by snorting nasally. She denies any alcohol use. She reports a history of withdrawal from opiates in the past.And is noted to be positive for marijuana and opiates on her UDS.  She is noted to be anemicand is status post 2 unit PRBC transfusion with improvement noted. No overt bleeding currently identified, but patient is complaining of some  epigastric abdominal pain. As result of this pain, she has not been eating very much. She denies any nausea or vomiting.GI has evaluated patient with no need for endoscopy noted at this time.  she refuses bathing and laboratory draws.  She has been using a bedpan and has not been eating very well.  She has been more somnolent from time to time possibly due to oversedation   Plan:- 1)Sepsis with early septic shock in the setting of MSSA bacteremia, tricuspid vegetation, paravertebral myositis and cavitary pneumonia--- sepsis pathophysiology and septic shock appears to have resolved   -Pneumonia lesions could also represent septic emboli-------Case discussed previously with infectious disease doctor who has recommended continue  IV Ancef for total of 6 weeks from date of first negative culture which was 02/12/2018 in a supervised setting (IVDU) -2D echo positive for tricuspid vegetation and moderate regurgitation.  No further fevers , repeat blood cultures from 02/20/2018 Negative to date, WBC is down to 11.2  from 21.9  2)-AKI (acute kidney injury) (HCC)-resolved---, continue to encourage adequate oral intake  3)-Substance use disorder/opiate withdrawal syndrome-- -    for the most part she appears to be resting comfortably for extended/long periods of time without any evidence of pain, patient has been weaned off Dilaudid, okay to continue PRN low-dose oxycodone continue  methocarbamol 750mg   4 times  daily, Cymbalta 30 mg twice daily and gabapentin 300 mg 3 times daily to help blunt withdrawal symptoms, c/n hydroxyzine 25 mg nightly UDS on 02/24/2018 shows opiates --- this was done after there was suspicion of possible inhalation/snorting of drugs in the room on the telemetry sitter camera--- patient did receive Dilaudid here in the hospital, cannot rule out concomitant inhalation/snorting of opiates on 02/24/18   4)Tobacco Abuse---Extensive cessation counseling provided--continue nicotine  patch   5)positive hepatitis C antibody--- -Quantitative viral load test undetectable;-No treatment needed , outpatient follow-up with ID/gastroenterology advised  6)Generalized weakness and debility----continues to refuse from time to time to work with physical therapy, patient continues to refuse to participate in ADLs often, wanting to have staff do more and more for her--- encourage to participate more in ADLs and physical therapy, pt now complaints of back pain as of 02/25/2018, patient currently has MSSA bacteremia and back pain---need to rule out epidural abscess so will repeat lumbar MRI with contrast (prior lumbar MRI from 02/08/2018 was without contrast as patient refused contrast at the time )  7)-severe anemia-suspecthemolysis in the setting of acute infection-stable -Anemia panelrevealed some iron deficiency and stool occult is negative -2 unit PRBC orderedon 02/14/18--- Hgb remains stable post prior transfusion -Appreciate GI evaluation with no recommendations for endoscopy at this time -Continue p.o. PPI, -Ferahemegiven during this admission due to iron deficiency  8)Depression/Anxiety-----c/n Cymbalta for depression, may use hydroxyzine for sleep and anxiety    DVT prophylaxis:SCDs Code Status:Full code Family Communication:No family at bedside. Disposition Plan:Remains inpatient, continue IV Ancef for total 6 weeks  From first negative cultureas patient is not a candidate for PICC line placement due to  IV drug use. Tele-sitter at bedside.    .  .  Consultants:  Infectious disease over the phone (Dr. Orvan Falconer).  GI Dr. Karilyn Cota  Palliative care over phone  Procedures:  2D echo:  Fraction (55-60%), and no microscopic valve with moderate regurgitation and large vegetation. No wall motion normalities appreciated.  Antimicrobials:  Cefazolin 2/7->   Vancomycin 2/7-2/7     Lab Results  Component Value Date   PLT 300 02/25/2018     Inpatient Medications  Scheduled Meds: . diclofenac  1 patch Transdermal BID  . DULoxetine  30 mg Oral BID  . gabapentin  300 mg Oral TID  . hydrOXYzine  25 mg Oral QHS  . ipratropium-albuterol  3 mL Nebulization Q6H  . methocarbamol  750 mg Oral QID  . metoprolol tartrate  50 mg Oral BID  . multivitamin with minerals  1 tablet Oral Daily  . nicotine  7 mg Transdermal Daily  . pantoprazole  40 mg Oral BID  . senna-docusate  2 tablet Oral BID  . sodium chloride flush  3 mL Intravenous Q12H   Continuous Infusions: . sodium chloride 50 mL/hr at 02/24/18 0056  .  ceFAZolin (ANCEF) IV 2 g (02/25/18 1323)   PRN Meds:.acetaminophen **OR** acetaminophen, albuterol, guaiFENesin-dextromethorphan, ondansetron **OR** ondansetron (ZOFRAN) IV, oxyCODONE, pentafluoroprop-tetrafluoroeth    Anti-infectives (From admission, onward)   Start     Dose/Rate Route Frequency Ordered Stop   02/09/18 1500  vancomycin (VANCOCIN) IVPB 750 mg/150 ml premix  Status:  Discontinued     750 mg 150 mL/hr over 60 Minutes Intravenous Every 24 hours 02/08/18 1553 02/09/18 1201   02/09/18 1400  ceFAZolin (ANCEF) IVPB 2g/100 mL premix     2 g 200 mL/hr over 30 Minutes Intravenous Every 8 hours 02/09/18 1220 03/22/18 2359   02/09/18 1300  ceFAZolin (ANCEF) 2 g in dextrose 5 % 100 mL IVPB  Status:  Discontinued     2 g 200 mL/hr over 30 Minutes Intravenous Every 8 hours 02/09/18 1201 02/09/18 1220   02/09/18 0000  meropenem (MERREM) 1 g in sodium chloride 0.9 % 100 mL IVPB  Status:  Discontinued     1 g 200 mL/hr over 30 Minutes Intravenous Every 12 hours 02/08/18 1459 02/09/18 0853   02/08/18 1445  vancomycin (VANCOCIN) IVPB 1000 mg/200 mL premix     1,000 mg 200 mL/hr over 60 Minutes Intravenous  Once 02/08/18 1442 02/08/18 1555   02/08/18 1415  meropenem (MERREM) 1 g in sodium chloride 0.9 % 100 mL IVPB     1 g 200 mL/hr over 30 Minutes Intravenous  Once 02/08/18 1402 02/08/18 1455        Objective:    Vitals:   02/25/18 0536 02/25/18 0845 02/25/18 0846 02/25/18 1321  BP: (!) 123/101   (!) 136/106  Pulse: (!) 114  (!) 124 (!) 121  Resp: 20   18  Temp: (!) 97.4 F (36.3 C)   98.6 F (37 C)  TempSrc: Oral   Oral  SpO2: 97% 100% 100% 94%  Weight: 67 kg     Height:        Wt Readings from Last 3 Encounters:  02/25/18 67 kg     Intake/Output Summary (Last 24 hours) at 02/25/2018 1344 Last data filed at 02/24/2018 2153 Gross per 24 hour  Intake 480 ml  Output -  Net 480 ml     Physical Exam Patient is examined daily including today on 02/25/18 , exams remain the same as of yesterday except that has changed   Gen:-Awake, no acute distress HEENT:- [place].AT, No sclera icterus Neck-Supple Neck,No JVD,.  Lungs-diminished with scattered rhonchi especially on left, no wheezing CV- S1, S2 normal, regular , tachycardic Abd-  +ve B.Sounds, Abd Soft, No tenderness,    Extremity/Skin:-trace ankle Edema, negative Homans, pedal pulses present  Psych-affect is flat to lethargic, oriented x3 Neuro-generalized weakness without new focal deficits no new focal deficits, no tremors MSK--- lumbar range of motion adequate, palpation over the lumbar spine failed to   step deformity or crepitus on the right flank, overlying skin without significant erythema or inflammatory changes, patient does complain of pain with palpation over the lumbar spine and lumbar range of motion   Data Review:   Micro Results Recent Results (from the past 240 hour(s))  Culture, blood (routine x 2)     Status: None   Collection Time: 02/20/18  3:35 PM  Result Value Ref Range Status   Specimen Description BLOOD RIGHT FOREARM  Final   Special Requests   Final    BOTTLES DRAWN AEROBIC AND ANAEROBIC Blood Culture adequate volume   Culture   Final    NO GROWTH 5 DAYS Performed at [facility], [address] [place], [place] [address]    Report Status 02/25/2018 FINAL  Final  Culture, blood (routine x 2)     Status:  None   Collection Time: 02/20/18  3:53 PM  Result Value Ref Range Status   Specimen Description LEFT ANTECUBITAL  Final   Special Requests   Final    BOTTLES DRAWN AEROBIC AND ANAEROBIC Blood Culture adequate volume   Culture   Final    NO GROWTH 5 DAYS Performed at [facility], [address] [place], [place] [address]    Report Status 02/25/2018 FINAL  Final  Radiology Reports Ct Abdomen Pelvis Wo Contrast  Result Date: 02/08/2018 CLINICAL DATA:  Abdominal pain and fever EXAM: CT ABDOMEN AND PELVIS WITHOUT CONTRAST TECHNIQUE: Multidetector CT imaging of the abdomen and pelvis was performed following the standard protocol without oral or IV contrast. COMPARISON:  None. FINDINGS: Lower chest: There is airspace consolidation throughout the lung bases with several areas of cavitation, likely developing lung abscesses. Several nodular opacities are noted associated with these areas of cavitary consolidation, likely septic emboli. Hepatobiliary: Liver measures 20.5 cm in length. No focal liver lesions are appreciable on this noncontrast enhanced study. The gallbladder is borderline dilated without wall thickening. There is no biliary duct dilatation. Pancreas: No pancreatic mass or inflammatory focus. Spleen: Spleen measures 14.1 x 10.9 x 5.1 cm with a measured splenic volume of 392 cubic cm. No focal splenic lesions are evident. Adrenals/Urinary Tract: Adrenals appear unremarkable bilaterally. 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 No renal mass evident. No hydronephrosis on either side. There is a suspected developing staghorn calculus on the left with somewhat amorphous calcification involving a lower pole calyx on the left extending into the left renal pelvis, best appreciated on coronal imaging. This developing staghorn calculus measures 3.7 x 1.0 cm. No ureteral calculi are evident. Urinary bladder is decompressed. No urinary bladder wall thickening is appreciable with essentially empty  bladder. Stomach/Bowel: There is fluid throughout most small bowel loops. There is no appreciable bowel wall or mesenteric thickening. No evident bowel obstruction. No free air or portal venous air is evident. Vascular/Lymphatic: There is no abdominal aortic aneurysm. No vascular lesions are evident on this noncontrast enhanced study. Note that the attenuation throughout the vascular structures appear slightly diminished which raises question of anemia. No adenopathy is appreciable in abdomen or pelvis. Reproductive: Uterus is anteverted. No pelvic masses evident. There is a slight degree of free fluid in the cul-de-sac region. Other: Appendix is not well seen. There is no periappendiceal region inflammation on this study. No abscess is seen in the abdomen or pelvis. There is no ascites beyond the slight fluid noted in the cul-de-sac region. Musculoskeletal: There are no blastic or lytic bone lesions. No intramuscular or abdominal wall lesions are appreciable. IMPRESSION: Comment: Note that paucity of fat makes assessment in the abdomen and pelvis somewhat less than optimal. 1. Areas of cavitary pneumonia, likely with developing lung abscesses, in the right lower lobe. Areas of consolidation elsewhere with probable septic emboli associated. 2. Prominent liver and spleen. No focal liver and splenic lesions evident. 3. There is evidence of a degree of nephrocalcinosis. Amorphous calcification in a portion of the left renal pelvis and an inferior left calyx suggest developing staghorn calculus. No hydronephrosis on either side. No well-defined ureteral calculi. 4. Fluid in most loops of small bowel. Suspect a degree of ileus or enteritis. No bowel obstruction. No abscess evident in the abdomen or pelvis. 5. Small amount of free fluid in the cul-de-sac may be upper physiologic. Electronically Signed   By: Bretta Bang III M.D.   On: 02/08/2018 14:47   Dg Chest 1 View  Result Date: 02/08/2018 CLINICAL DATA:   Hypotension and tachycardia. Smoking history. Asthma. EXAM: CHEST  1 VIEW COMPARISON:  None. FINDINGS: Heart size is within normal limits. Mild prominence of the LEFT hilum. Patchy opacities bilaterally, most prominent at the RIGHT lung base, majority of which have central lucencies suggest cavitary lesions. IMPRESSION: 1. Patchy opacities bilaterally, majority of which have central lucencies suggest cavitary masses/infections. Corresponding cavitary consolidations noted at the  lung bases of a CT abdomen performed at the time of today's chest x-ray. Recommend CT chest with contrast for complete evaluation. 2. Questionable prominence of the LEFT hilum, additional cavitary mass/consolidation versus central bronchiectasis. Electronically Signed   By: Bary Richard M.D.   On: 02/08/2018 14:48   Dg Chest 2 View  Result Date: 02/24/2018 CLINICAL DATA:  Short of breath EXAM: CHEST - 2 VIEW COMPARISON:  02/20/2018 FINDINGS: Upper normal heart size. Stable left pleural effusion. Tiny right pleural effusion is stable. Patchy airspace opacities throughout both lungs are not significantly changed. No pneumothorax. IMPRESSION: Bilateral airspace disease and bilateral pleural effusions left greater than right are stable. Electronically Signed   By: Jolaine Click M.D.   On: 02/24/2018 15:50   Mr Lumbar Spine W Wo Contrast  Result Date: 02/08/2018 CLINICAL DATA:  Acute onset of back pain beginning 2 days ago. Study was ordered with contrast but the patient refused. EXAM: MRI LUMBAR SPINE WITHOUT CONTRAST TECHNIQUE: Multiplanar, multisequence MR imaging of the lumbar spine was performed. No intravenous contrast was administered. COMPARISON:  None. FINDINGS: Segmentation:  5 lumbar type vertebral bodies. Alignment:  Normal Vertebrae:  Normal Conus medullaris and cauda equina: Conus extends to the L1 level. Conus and cauda equina appear normal. Paraspinal and other soft tissues: There is lower paraspinous muscle edema as might  be seen with a muscular strain. This appears symmetric from right to left. Disc levels: No abnormality at L3-4 or above. L4-5: Very minimal disc bulge.  No stenosis or neural compression. L5-S1: Very minimal disc bulge.  No stenosis or neural compression. IMPRESSION: Minimal disc bulges at L4-5 and L5-S1. No stenosis or neural compression. Paraspinous muscle edema on both sides in the lower lumbar region as might be seen with a muscular strain. This is nonspecific. The differential diagnosis does include infection, but the symmetric nature would be unusual. The patient refused contrast administration. Electronically Signed   By: Paulina Fusi M.D.   On: 02/08/2018 13:56   Dg Chest Port 1 View  Result Date: 02/20/2018 CLINICAL DATA:  Sepsis, endocarditis, cavitary pneumonia with septic emboli and fever. EXAM: PORTABLE CHEST 1 VIEW COMPARISON:  02/08/2018 FINDINGS: Multiple cavitary lesions again identified in both lungs with associated airspace disease in a pattern likely representing septic emboli with multiple areas of cavitary infection. There is a new left lateral pleural effusion which appears likely loculated and may represent an empyema. Further evaluation with CT of the chest with contrast may be helpful. The heart size is stable. IMPRESSION: Persistent appearance of bilateral cavitary pneumonia likely representing septic emboli. There is a new loculated left lateral pleural effusion that may represent a developing empyema. Further evaluation with CT of the chest with contrast may be helpful. Electronically Signed   By: Irish Lack M.D.   On: 02/20/2018 19:11     CBC Recent Labs  Lab 02/20/18 0932 02/22/18 0941 02/25/18 0643  WBC 13.8* 12.3* 11.1*  HGB 8.3* 8.0* 9.8*  HCT 27.5* 26.9* 33.3*  PLT 151 198 300  MCV 85.9 87.6 90.0  MCH 25.9* 26.1 26.5  MCHC 30.2 29.7* 29.4*  RDW 20.4* 20.9* 20.4*    Chemistries  Recent Labs  Lab 02/20/18 0932 02/22/18 0941 02/25/18 0643  NA 134*  134* 137  K 4.2 4.1 3.8  CL 103 102 105  CO2 GLUCOSE 99 111* 100*  BUN <5* 5* 6  CREATININE 0.44 0.47 0.63  CALCIUM 7.4* 7.6* 8.2*  AST 50*  --   --  ALT 17  --   --   ALKPHOS 83  --   --   BILITOT 0.3  --   --     ------------------------------------------------------------------------------------------------------------------ No results found for: BNP   Shon Hale M.D on 02/25/2018 at 1:44 PM  Go to [email or web address] - for contact info  [facility] - Office  [phone]

## 2018-02-25 NOTE — Progress Notes (Signed)
Pt ambulated approximately 25 feet with PT this morning. Tolerated fair. Pt sat in chair approx. 20 mins earlier this morning. Pt ambulated to [facility], and is currently sitting up on the side of the bed eating cereal. Will continue to monitor and encourage ambulation as tolerated.

## 2018-02-26 ENCOUNTER — Inpatient Hospital Stay: Payer: Self-pay

## 2018-02-26 ENCOUNTER — Inpatient Hospital Stay (HOSPITAL_COMMUNITY): Payer: Self-pay

## 2018-02-26 MED ORDER — GADOBUTROL 1 MMOL/ML IV SOLN
7.0000 mL | Freq: Once | INTRAVENOUS | Status: AC | PRN
  Administered 2018-02-26: 7 mL via INTRAVENOUS

## 2018-02-26 MED ORDER — SODIUM CHLORIDE 0.9% FLUSH: 10.0000 mL | INTRAVENOUS | Status: DC | PRN

## 2018-02-26 MED ORDER — SODIUM CHLORIDE 0.9% FLUSH
10.0000 mL | Freq: Two times a day (BID) | INTRAVENOUS | Status: DC
  Administered 2018-02-28 – 2018-03-15 (×26): 10 mL
  Administered 2018-03-16: 20 mL
  Administered 2018-03-16 – 2018-03-21 (×10): 10 mL
  Administered 2018-03-22: 20 mL
  Administered 2018-03-22 – 2018-03-23 (×2): 10 mL
  Administered 2018-03-23: 20 mL
  Administered 2018-03-24 – 2018-03-25 (×3): 10 mL

## 2018-02-26 MED ORDER — LORAZEPAM 2 MG/ML IJ SOLN
1.0000 mg | Freq: Once | INTRAMUSCULAR | Status: AC
  Administered 2018-02-26: 1 mg via INTRAVENOUS
  Filled 2018-02-26: qty 1

## 2018-02-26 NOTE — Progress Notes (Signed)
Peripherally Inserted Central Catheter/Midline Placement  The IV Nurse has discussed with the patient and/or persons authorized to consent for the patient, the purpose of this procedure and the potential benefits and risks involved with this procedure.  The benefits include less needle sticks, lab draws from the catheter, and the patient may be discharged home with the catheter. Risks include, but not limited to, infection, bleeding, blood clot (thrombus formation), and puncture of an artery; nerve damage and irregular heartbeat and possibility to perform a PICC exchange if needed/ordered by physician.  Alternatives to this procedure were also discussed.  Bard Power PICC patient education guide, fact sheet on infection prevention and patient information card has been provided to patient /or left at bedside.    PICC/Midline Placement Documentation        Timmothy Sours 02/26/2018, 7:27 PM

## 2018-02-26 NOTE — Progress Notes (Signed)
Nutrition Follow up  DOCUMENTATION CODES:   Severe malnutrition in context of acute illness/injury  INTERVENTION:  Regular diet /snack between meals   Continue- Magic Cup BID  MVI daily  NUTRITION DIAGNOSIS:   Severe Malnutrition related to acute illness(MSSA bacteremia, Pneumonia, endocarditis.) as evidenced by energy intake < or equal to 50% for > or equal to 5 days, 17% wt gain (BLE-(moderate to severe) edema).    GOAL:  Patient will meet greater than or equal to 90% of their needs; progressing with improved appetite and meal intake  MONITOR:   PO intake, Weight trends, Labs, I & O's  ASSESSMENT: Patient is a 23 yo female with a history of substance abuse, severe anemia (received 2 units PRBC's), MSSA bacteremia, Pneumonia, endocarditis. Bi-lateral pedal edema. Severe weight gain since admission-17% increase in wt or gain of 9.9 kg.   Supplements: none- patient refuses offer of any supplements or milk with meals- replies, "I don't have a taste for anything." Explained to patient that her body requires nutrition whether she "feels like" eating or not. RD unable to identify any food or beverage that pt would commit to eat or drink.  F/U- patient consumed 100% of breakfast this morning and on RD arrival she is waiting on nutrition services to bring a snack of cereal. Patient says she is eating better and her demeanor is better today. Lower extremity edema looks better and she is wearing her ted hose. Labs reviewed. Current weight 68 kg which is a gain of 13.6 kg (20%).    Labs: BMP Latest Ref Rng & Units 02/25/2018 02/22/2018 02/20/2018  Glucose 70 - 99 mg/dL 770(H) 403(T) 99  BUN 6 - 20 mg/dL 6 5(L) <2(Y)  Creatinine 0.44 - 1.00 mg/dL 8.18 5.90 9.31  Sodium 135 - 145 mmol/L 137 134(L) 134(L)  Potassium 3.5 - 5.1 mmol/L 3.8 4.1 4.2  Chloride 98 - 111 mmol/L 105 102 103  CO2 22 - 32 mmol/L 25 23 23   Calcium 8.9 - 10.3 mg/dL 8.2(L) 7.6(L) 7.4(L)      NUTRITION - FOCUSED  PHYSICAL EXAM:   Diet Order:   Diet Order            Diet regular Room service appropriate? Yes; Fluid consistency: Thin  Diet effective now              EDUCATION NEEDS:   Not appropriate for education at this time   Skin:  Skin Assessment: Reviewed RN Assessment  Last BM:  2/23  Height:   Ht Readings from Last 1 Encounters:  02/08/18 5\' 3"  (1.6 m)    Weight:   Wt Readings from Last 1 Encounters:  02/26/18 68 kg    Ideal Body Weight:  52 kg  BMI:  Body mass index is 26.56 kg/m.  Estimated Nutritional Needs:   Kcal:  1650-1815 (30-33 kcal/kg/bw)(Based on admission wt of 55 kg. Severe wt gain (20 lb) since admission.)  Protein:  88-99 (1.6-1.8 gr/kg/bw)  Fluid:  per MD goals   Royann Shivers MS,RD,CSG,LDN Office: [phone] Pager: [phone]

## 2018-02-26 NOTE — Progress Notes (Signed)
Patient ambulated to bathroom with walker.

## 2018-02-26 NOTE — Progress Notes (Signed)
Patient Demographics:    Amy Mcknight, is a 22 y.o. female, DOB - December 03, 1995, GNF:[id number]  Admit date - 02/08/2018   Admitting Physician Charlsie Quest, MD  Outpatient Primary MD for the patient is Patient, No Pcp Per  LOS - 18   No chief complaint on file.       Subjective:    Amy Mcknight today has no fevers, no emesis,  No chest pain,no BM, no new concerns   Assessment  & Plan :    Principal Problem:   MSSA bacteremia Active Problems:   Cavitary pneumonia   AKI (acute kidney injury) (HCC)   Substance use disorder   Hyponatremia   IVDU (intravenous drug user)   Myositis   Normocytic anemia   Thrombocytopenia (HCC)   Cigarette smoker   Hepatitis C antibody test positive   Endocarditis of tricuspid valve   Protein-calorie malnutrition, severe  Brief Narrative:  23 y.o.femalewith medical history significant forsubstance use disorderwho presents to the ED with 2 days of right lower back pain. Patient states symptoms began with acute onset while at rest. Symptoms have been persistent. She has noted associated diaphoresis, chills, shortness of breath, cough productive of brown sputum, and right chest wall pain. She denies any rashes or obvious skin changes. She reports good urine output without dysuria. She denies any abdominal pain, diarrhea, or constipation.  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 She continues to use recreational drugs by snorting nasally. She denies any alcohol use. She reports a history of withdrawal from opiates in the past.And is noted to be positive for marijuana and opiates on her UDS.  She is noted to be anemicand is status post 2 unit PRBC transfusion with improvement noted. No overt bleeding currently identified, but patient is complaining of some epigastric abdominal pain. As result of this pain, she has not been eating very much.  She denies any nausea or vomiting.GI has evaluated patient with no need for endoscopy noted at this time.  she refuses bathing and laboratory draws.  She has been using a bedpan and has not been eating very well.  She has been more somnolent from time to time possibly due to oversedation  Plan:- 1)Sepsis with early Septic shock in the setting of MSSA bacteremia, tricuspid vegetation, paravertebral myositis and cavitary pneumonia--- sepsis pathophysiology and septic shock has resolved   -Pneumonia lesions could also represent septic emboli-------Case discussed previously with infectious disease doctor who has recommended continue  IV Ancef for total of 6 weeks from date of first negative culture which was 02/12/2018 in a supervised setting (IVDU) -2D echo positive for tricuspid vegetation and moderate regurgitation.  No further fevers , repeat blood cultures from 02/20/2018 Negative to date, WBC is down to 11.2  from 21.9  2)-AKI (acute kidney injury) (HCC)-resolved---, continue to encourage adequate oral intake  3)-Substance use disorder/opiate withdrawal syndrome-- -    for the most part she appears to be resting comfortably for extended/long periods of time without any evidence of pain, patient has been weaned off Dilaudid, okay to continue PRN low-dose oxycodone continue  methocarbamol   4 times daily, Cymbalta 30 mg twice daily and gabapentin 300 mg 3 times daily to help blunt withdrawal symptoms,  c/n hydroxyzine 25 mg nightly UDS on 02/24/2018 shows opiates --- this was done after there was suspicion of possible inhalation/snorting of drugs in the room on the telemetry sitter camera--- patient did receive Dilaudid here in the hospital, cannot rule out concomitant inhalation/snorting of opiates on 02/24/18   4)Tobacco Abuse---Extensive cessation counseling provided--continue nicotine patch   5)positive hepatitis C antibody--- -Quantitative viral load test undetectable;-No treatment  needed , outpatient follow-up with ID/gastroenterology advised  6)Generalized weakness and debility----continues to refuse from time to time to work with physical therapy, patient continues to refuse to participate in ADLs often, wanting to have staff do more and more for her--- encourage to participate more in ADLs and physical therapy, pt now complaints of back pain as of 02/25/2018, patient currently has MSSA bacteremia and back pain--- lumbar MRI from 02/26/2018 without any evidence of  epidural abscess or significant inflammation ,  (prior lumbar MRI from 02/08/2018 was without contrast as patient refused contrast at the time )  7)Severe Anemia-suspecthemolysis in the setting of acute infection-stable -Anemia panelrevealed some iron deficiency and stool occult is negative -2 unit PRBC orderedon 02/14/18--- Hgb remains stable post prior transfusion -Appreciate GI evaluation with no recommendations for endoscopy at this time -Continue p.o. PPI, -Ferahemegiven during this admission due to iron deficiency  8)Depression/Anxiety-----stable,  c/n Cymbalta for depression, may use hydroxyzine for sleep and anxiety    DVT prophylaxis:SCDs Code Status:Full code Family Communication:No family at bedside. Disposition Plan:Remains inpatient, continue IV Ancef for total 6 weeks  From first negative culture (02/12/18)as patient is not a candidate for PICC line placement due to  IV drug use. Tele-sitter at bedside.    .  .  Consultants:  Infectious disease over the phone (Dr. Orvan Falconer).  GI Dr. Karilyn Cota  Palliative care over phone  Procedures:  2D echo:  Fraction (55-60%), and no microscopic valve with moderate regurgitation and large vegetation. No wall motion normalities appreciated.  Antimicrobials:  Cefazolin 2/7->   Vancomycin 2/7-2/7     Lab Results  Component Value Date   PLT 300 02/25/2018    Inpatient Medications  Scheduled Meds: . diclofenac  1 patch  Transdermal BID  . DULoxetine  30 mg Oral BID  . gabapentin  300 mg Oral TID  . hydrOXYzine  25 mg Oral QHS  . ipratropium-albuterol  3 mL Nebulization Q6H  . methocarbamol  750 mg Oral QID  . metoprolol tartrate  50 mg Oral BID  . multivitamin with minerals  1 tablet Oral Daily  . nicotine  7 mg Transdermal Daily  . pantoprazole  40 mg Oral BID  . senna-docusate  2 tablet Oral BID  . sodium chloride flush  3 mL Intravenous Q12H   Continuous Infusions: . sodium chloride 50 mL/hr at 02/24/18 0056  .  ceFAZolin (ANCEF) IV 2 g (02/26/18 0437)   PRN Meds:.acetaminophen **OR** acetaminophen, albuterol, guaiFENesin-dextromethorphan, ondansetron **OR** ondansetron (ZOFRAN) IV, oxyCODONE, pentafluoroprop-tetrafluoroeth    Anti-infectives (From admission, onward)   Start     Dose/Rate Route Frequency Ordered Stop   02/09/18 1500  vancomycin (VANCOCIN) IVPB 750 mg/150 ml premix  Status:  Discontinued     750 mg 150 mL/hr over 60 Minutes Intravenous Every 24 hours 02/08/18 1553 02/09/18 1201   02/09/18 1400  ceFAZolin (ANCEF) IVPB 2g/100 mL premix     2 g 200 mL/hr over 30 Minutes Intravenous Every 8 hours 02/09/18 1220 03/22/18 2359   02/09/18 1300  ceFAZolin (ANCEF) 2 g in dextrose 5 % 100 mL  IVPB  Status:  Discontinued     2 g 200 mL/hr over 30 Minutes Intravenous Every 8 hours 02/09/18 1201 02/09/18 1220   02/09/18 0000  meropenem (MERREM) 1 g in sodium chloride 0.9 % 100 mL IVPB  Status:  Discontinued     1 g 200 mL/hr over 30 Minutes Intravenous Every 12 hours 02/08/18 1459 02/09/18 0853   02/08/18 1445  vancomycin (VANCOCIN) IVPB 1000 mg/200 mL premix     1,000 mg 200 mL/hr over 60 Minutes Intravenous  Once 02/08/18 1442 02/08/18 1555   02/08/18 1415  meropenem (MERREM) 1 g in sodium chloride 0.9 % 100 mL IVPB     1 g 200 mL/hr over 30 Minutes Intravenous  Once 02/08/18 1402 02/08/18 1455        Objective:   Vitals:   02/26/18 0054 02/26/18 0534 02/26/18 0754 02/26/18 1304    BP:  (!) 123/93  (!) 118/91  Pulse:  (!) 110    Resp:  20  (!) 22  Temp:  97.9 F (36.6 C)    TempSrc:  Oral    SpO2: 100% 100% 99%   Weight:  68 kg    Height:        Wt Readings from Last 3 Encounters:  02/26/18 68 kg     Intake/Output Summary (Last 24 hours) at 02/26/2018 1813 Last data filed at 02/26/2018 1300 Gross per 24 hour  Intake 1380 ml  Output -  Net 1380 ml     Physical Exam Patient is examined daily including today on 02/26/18 , exams remain the same as of yesterday except that has changed   Gen:-Awake, no acute distress HEENT:- [place].AT, No sclera icterus Neck-Supple Neck,No JVD,.  Lungs-diminished with scattered rhonchi especially on left, no wheezing CV- S1, S2 normal, regular , tachycardic Abd-  +ve B.Sounds, Abd Soft, No tenderness,    Extremity/Skin:-trace ankle Edema, negative Homans, pedal pulses present  Psych-affect is flat to lethargic, oriented x3 Neuro-generalized weakness without new focal deficits no new focal deficits, no tremors MSK--- lumbar range of motion adequate, palpation over the lumbar spine failed to demonstrate step deformity or crepitus, overlying skin without significant erythema or inflammatory changes, patient does complain of pain with palpation over the lumbar spine and lumbar range of motion   Data Review:   Micro Results Recent Results (from the past 240 hour(s))  Culture, blood (routine x 2)     Status: None   Collection Time: 02/20/18  3:35 PM  Result Value Ref Range Status   Specimen Description BLOOD RIGHT FOREARM  Final   Special Requests   Final    BOTTLES DRAWN AEROBIC AND ANAEROBIC Blood Culture adequate volume   Culture   Final    NO GROWTH 5 DAYS Performed at [facility], [address], [place], [place] [address]    Report Status 02/25/2018 FINAL  Final  Culture, blood (routine x 2)     Status: None   Collection Time: 02/20/18  3:53 PM  Result Value Ref Range Status   Specimen Description LEFT ANTECUBITAL   Final   Special Requests   Final    BOTTLES DRAWN AEROBIC AND ANAEROBIC Blood Culture adequate volume   Culture   Final    NO GROWTH 5 DAYS Performed at [facility], [address], [place], [place] [address]    Report Status 02/25/2018 FINAL  Final    Radiology Reports Ct Abdomen Pelvis Wo Contrast  Result Date: 02/08/2018 CLINICAL DATA:  Abdominal pain  and fever EXAM: CT ABDOMEN AND PELVIS WITHOUT CONTRAST TECHNIQUE: Multidetector CT imaging of the abdomen and pelvis was performed following the standard protocol without oral or IV contrast. COMPARISON:  None. FINDINGS: Lower chest: There is airspace consolidation throughout the lung bases with several areas of cavitation, likely developing lung abscesses. Several nodular opacities are noted associated with these areas of cavitary consolidation, likely septic emboli. Hepatobiliary: Liver measures 20.5 cm in length. No focal liver lesions are appreciable on this noncontrast enhanced study. The gallbladder is borderline dilated without wall thickening. There is no biliary duct dilatation. Pancreas: No pancreatic mass or inflammatory focus. Spleen: Spleen measures 14.1 x 10.9 x 5.1 cm with a measured splenic volume of 392 cubic cm. No focal splenic lesions are evident. Adrenals/Urinary Tract: Adrenals appear unremarkable bilaterally. 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 No renal mass evident. No hydronephrosis on either side. There is a suspected developing staghorn calculus on the left with somewhat amorphous calcification involving a lower pole calyx on the left extending into the left renal pelvis, best appreciated on coronal imaging. This developing staghorn calculus measures 3.7 x 1.0 cm. No ureteral calculi are evident. Urinary bladder is decompressed. No urinary bladder wall thickening is appreciable with essentially empty bladder. Stomach/Bowel: There is fluid throughout most small bowel loops. There is no appreciable bowel wall or  mesenteric thickening. No evident bowel obstruction. No free air or portal venous air is evident. Vascular/Lymphatic: There is no abdominal aortic aneurysm. No vascular lesions are evident on this noncontrast enhanced study. Note that the attenuation throughout the vascular structures appear slightly diminished which raises question of anemia. No adenopathy is appreciable in abdomen or pelvis. Reproductive: Uterus is anteverted. No pelvic masses evident. There is a slight degree of free fluid in the cul-de-sac region. Other: Appendix is not well seen. There is no periappendiceal region inflammation on this study. No abscess is seen in the abdomen or pelvis. There is no ascites beyond the slight fluid noted in the cul-de-sac region. Musculoskeletal: There are no blastic or lytic bone lesions. No intramuscular or abdominal wall lesions are appreciable. IMPRESSION: Comment: Note that paucity of fat makes assessment in the abdomen and pelvis somewhat less than optimal. 1. Areas of cavitary pneumonia, likely with developing lung abscesses, in the right lower lobe. Areas of consolidation elsewhere with probable septic emboli associated. 2. Prominent liver and spleen. No focal liver and splenic lesions evident. 3. There is evidence of a degree of nephrocalcinosis. Amorphous calcification in a portion of the left renal pelvis and an inferior left calyx suggest developing staghorn calculus. No hydronephrosis on either side. No well-defined ureteral calculi. 4. Fluid in most loops of small bowel. Suspect a degree of ileus or enteritis. No bowel obstruction. No abscess evident in the abdomen or pelvis. 5. Small amount of free fluid in the cul-de-sac may be upper physiologic. Electronically Signed   By: Bretta Bang III M.D.   On: 02/08/2018 14:47   Dg Chest 1 View  Result Date: 02/08/2018 CLINICAL DATA:  Hypotension and tachycardia. Smoking history. Asthma. EXAM: CHEST  1 VIEW COMPARISON:  None. FINDINGS: Heart size is  within normal limits. Mild prominence of the LEFT hilum. Patchy opacities bilaterally, most prominent at the RIGHT lung base, majority of which have central lucencies suggest cavitary lesions. IMPRESSION: 1. Patchy opacities bilaterally, majority of which have central lucencies suggest cavitary masses/infections. Corresponding cavitary consolidations noted at the lung bases of a CT abdomen performed at the time of today's chest x-ray. Recommend CT  chest with contrast for complete evaluation. 2. Questionable prominence of the LEFT hilum, additional cavitary mass/consolidation versus central bronchiectasis. Electronically Signed   By: Bary Richard M.D.   On: 02/08/2018 14:48   Dg Chest 2 View  Result Date: 02/24/2018 CLINICAL DATA:  Short of breath EXAM: CHEST - 2 VIEW COMPARISON:  02/20/2018 FINDINGS: Upper normal heart size. Stable left pleural effusion. Tiny right pleural effusion is stable. Patchy airspace opacities throughout both lungs are not significantly changed. No pneumothorax. IMPRESSION: Bilateral airspace disease and bilateral pleural effusions left greater than right are stable. Electronically Signed   By: Jolaine Click M.D.   On: 02/24/2018 15:50   Mr Lumbar Spine W Wo Contrast  Result Date: 02/26/2018 CLINICAL DATA:  Low back pain since prior abnormal MRI 02/08/2018. Unable to walk. EXAM: MRI LUMBAR SPINE WITHOUT AND WITH CONTRAST TECHNIQUE: Multiplanar and multiecho pulse sequences of the lumbar spine were obtained without and with intravenous contrast. CONTRAST:  7 mL Gadavist COMPARISON:  02/08/2018 FINDINGS: Segmentation:  Standard. Alignment:  Physiologic. Vertebrae:  No fracture, evidence of discitis, or bone lesion. Conus medullaris and cauda equina: Conus extends to the L1 level. Conus and cauda equina appear normal. Paraspinal and other soft tissues: Posterior paraspinal muscle edema bilaterally with mild enhancement on postcontrast imaging. No intramuscular fluid collection or  hematoma. Disc levels: Disc spaces: Disc spaces are maintained. T12-L1: No significant disc bulge. No evidence of neural foraminal stenosis. No central canal stenosis. L1-L2: No significant disc bulge. No evidence of neural foraminal stenosis. No central canal stenosis. L2-L3: No significant disc bulge. No evidence of neural foraminal stenosis. No central canal stenosis. L3-L4: No significant disc bulge. No evidence of neural foraminal stenosis. No central canal stenosis. L4-L5: Minimal broad-based disc bulge. No evidence of neural foraminal stenosis. No central canal stenosis. L5-S1: Minimal broad-based disc bulge. No evidence of neural foraminal stenosis. No central canal stenosis. IMPRESSION: 1. No epidural fluid collection.  No discitis or osteomyelitis. 2. Posterior paraspinal muscle edema bilaterally with mild enhancement on postcontrast imaging. No intramuscular fluid collection or hematoma. Differential considerations include muscle strain versus mild myositis. Electronically Signed   By: Elige Ko   On: 02/26/2018 09:53   Mr Lumbar Spine W Wo Contrast  Result Date: 02/08/2018 CLINICAL DATA:  Acute onset of back pain beginning 2 days ago. Study was ordered with contrast but the patient refused. EXAM: MRI LUMBAR SPINE WITHOUT CONTRAST TECHNIQUE: Multiplanar, multisequence MR imaging of the lumbar spine was performed. No intravenous contrast was administered. COMPARISON:  None. FINDINGS: Segmentation:  5 lumbar type vertebral bodies. Alignment:  Normal Vertebrae:  Normal Conus medullaris and cauda equina: Conus extends to the L1 level. Conus and cauda equina appear normal. Paraspinal and other soft tissues: There is lower paraspinous muscle edema as might be seen with a muscular strain. This appears symmetric from right to left. Disc levels: No abnormality at L3-4 or above. L4-5: Very minimal disc bulge.  No stenosis or neural compression. L5-S1: Very minimal disc bulge.  No stenosis or neural  compression. IMPRESSION: Minimal disc bulges at L4-5 and L5-S1. No stenosis or neural compression. Paraspinous muscle edema on both sides in the lower lumbar region as might be seen with a muscular strain. This is nonspecific. The differential diagnosis does include infection, but the symmetric nature would be unusual. The patient refused contrast administration. Electronically Signed   By: Paulina Fusi M.D.   On: 02/08/2018 13:56   Dg Chest Port 1 View  Result Date: 02/20/2018  CLINICAL DATA:  Sepsis, endocarditis, cavitary pneumonia with septic emboli and fever. EXAM: PORTABLE CHEST 1 VIEW COMPARISON:  02/08/2018 FINDINGS: Multiple cavitary lesions again identified in both lungs with associated airspace disease in a pattern likely representing septic emboli with multiple areas of cavitary infection. There is a new left lateral pleural effusion which appears likely loculated and may represent an empyema. Further evaluation with CT of the chest with contrast may be helpful. The heart size is stable. IMPRESSION: Persistent appearance of bilateral cavitary pneumonia likely representing septic emboli. There is a new loculated left lateral pleural effusion that may represent a developing empyema. Further evaluation with CT of the chest with contrast may be helpful. Electronically Signed   By: Irish Lack M.D.   On: 02/20/2018 19:11   [place] Ekg Site Rite  Result Date: 02/26/2018 If Site Rite image not attached, placement could not be confirmed due to current cardiac rhythm.    CBC Recent Labs  Lab 02/20/18 0932 02/22/18 0941 02/25/18 0643  WBC 13.8* 12.3* 11.1*  HGB 8.3* 8.0* 9.8*  HCT 27.5* 26.9* 33.3*  PLT 151 198 300  MCV 85.9 87.6 90.0  MCH 25.9* 26.1 26.5  MCHC 30.2 29.7* 29.4*  RDW 20.4* 20.9* 20.4*    Chemistries  Recent Labs  Lab 02/20/18 0932 02/22/18 0941 02/25/18 0643  NA 134* 134* 137  K 4.2 4.1 3.8  CL 103 102 105  CO2 GLUCOSE 99 111* 100*  BUN <5* 5* 6    CREATININE 0.44 0.47 0.63  CALCIUM 7.4* 7.6* 8.2*  AST 50*  --   --   ALT 17  --   --   ALKPHOS 83  --   --   BILITOT 0.3  --   --     Shon Hale M.D on 02/26/2018 at 6:13 PM  Go to [email or web address] - for contact info  [facility] - Office  [phone]

## 2018-02-27 NOTE — Progress Notes (Signed)
Patient Demographics:    Amy Mcknight, is a 23 y.o. female, DOB - 1995/05/24, WJX:[id number]  Admit date - 02/08/2018   Admitting Physician Charlsie QuestVishal R Patel, MD  Outpatient Primary MD for the patient is Patient, No Pcp Per  LOS - 19   No chief complaint on file.       Subjective:    Amy BattlesShan Kauk today has no fevers, no emesis,  No chest pain, continues to be reluctant to get out of bed,   Assessment  & Plan :    Principal Problem:   MSSA bacteremia Active Problems:   Cavitary pneumonia   AKI (acute kidney injury) (HCC)   Substance use disorder   Hyponatremia   IVDU (intravenous drug user)   Myositis   Normocytic anemia   Thrombocytopenia (HCC)   Cigarette smoker   Hepatitis C antibody test positive   Endocarditis of tricuspid valve   Protein-calorie malnutrition, severe  Brief Narrative:  23 y.o.femalewith medical history significant forsubstance use disorderwho presents to the ED with 2 days of right lower back pain. Patient states symptoms began with acute onset while at rest. Symptoms have been persistent. She has noted associated diaphoresis, chills, shortness of breath, cough productive of brown sputum, and right chest wall pain. She denies any rashes or obvious skin changes. She reports good urine output without dysuria. She denies any abdominal pain, diarrhea, or constipation.  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 She continues to use recreational drugs by snorting nasally. She denies any alcohol use. She reports a history of withdrawal from opiates in the past.And is noted to be positive for marijuana and opiates on her UDS.  She is noted to be anemicand is status post 2 unit PRBC transfusion with improvement noted. No overt bleeding currently identified, but patient is complaining of some epigastric abdominal pain. As result of this pain, she has not  been eating very much. She denies any nausea or vomiting.GI has evaluated patient with no need for endoscopy noted at this time.  she refuses bathing and laboratory draws.  She has been using a bedpan and has not been eating very well.  She has been more somnolent from time to time possibly due to oversedation  Plan:- 1)Sepsis with early Septic shock in the setting of MSSA bacteremia, tricuspid vegetation, paravertebral myositis and cavitary pneumonia--- sepsis pathophysiology and septic shock has resolved   -Pneumonia lesions could also represent septic emboli-------Case discussed previously with infectious disease doctor who has recommended continue  IV Ancef for total of 6 weeks from date of first negative culture which was 02/12/2018 in a supervised setting (IVDU) -2D echo positive for tricuspid vegetation and moderate regurgitation.  No further fevers , repeat blood cultures from 02/20/2018 Negative to date, WBC is down to 11.2  from 21.9  2)-AKI (acute kidney injury) (HCC)-resolved---, continue to encourage adequate oral intake  3)-Substance use disorder/opiate withdrawal syndrome-- -    for the most part she appears to be resting comfortably for extended/long periods of time without any evidence of pain, patient has been weaned off Dilaudid, okay to continue PRN low-dose oxycodone continue  methocarbamol 750mg   4 times daily, Cymbalta 30 mg twice daily and gabapentin 300 mg 3 times daily  to help blunt withdrawal symptoms, c/n hydroxyzine 25 mg nightly UDS on 02/24/2018 shows opiates --- this was done after there was suspicion of possible inhalation/snorting of drugs in the room on the telemetry sitter camera--- patient did receive Dilaudid here in the hospital, cannot rule out concomitant inhalation/snorting of opiates on 02/24/18  Dilaudid has been discontinued patient only has PRN oxycodone 5 mg at this time   4)Tobacco Abuse---Extensive cessation counseling provided--continue nicotine  patch   5)positive hepatitis C antibody--- -Quantitative viral load test undetectable;-No treatment needed , outpatient follow-up with ID/gastroenterology advised  6)Generalized weakness and debility----continues to refuse from time to time to work with physical therapy, patient continues to refuse to participate in ADLs often, wanting to have staff do more and more for her--- encourage to participate more in ADLs and physical therapy, pt now complaints of back pain as of 02/25/2018, patient currently has MSSA bacteremia and back pain--- lumbar MRI from 02/26/2018 without any evidence of  epidural abscess or significant inflammation ,  (prior lumbar MRI from 02/08/2018 was without contrast as patient refused contrast at the time )  7)Severe Anemia-suspecthemolysis in the setting of acute infection-stable -Anemia panelrevealed some iron deficiency and stool occult is negative -2 unit PRBC orderedon 02/14/18--- Hgb remains stable post prior transfusion -Appreciate GI evaluation with no recommendations for endoscopy at this time -Continue p.o. PPI, -Ferahemegiven during this admission due to iron deficiency  8)Depression/Anxiety-----stable,  c/n Cymbalta for depression, may use hydroxyzine for sleep and anxiety    DVT prophylaxis:SCDs Code Status:Full code Family Communication:No family at bedside. Disposition Plan:Remains inpatient, continue IV Ancef for total 6 weeks  From first negative culture (02/12/18)as patient is not a candidate for PICC line placement due to  IV drug use. Tele-sitter at bedside.    .  .  Consultants:  Infectious disease over the phone (Dr. Orvan Falconer).  GI Dr. Karilyn Cota  Palliative care over phone  Procedures:  2D echo:  Fraction (55-60%), and no microscopic valve with moderate regurgitation and large vegetation. No wall motion normalities appreciated.  Antimicrobials:  Cefazolin 2/7->   Vancomycin 2/7-2/7     Lab Results  Component  Value Date   PLT 300 02/25/2018    Inpatient Medications  Scheduled Meds: . diclofenac  1 patch Transdermal BID  . DULoxetine  30 mg Oral BID  . gabapentin  300 mg Oral TID  . hydrOXYzine  25 mg Oral QHS  . ipratropium-albuterol  3 mL Nebulization Q6H  . methocarbamol  750 mg Oral QID  . metoprolol tartrate  50 mg Oral BID  . multivitamin with minerals  1 tablet Oral Daily  . nicotine  7 mg Transdermal Daily  . pantoprazole  40 mg Oral BID  . senna-docusate  2 tablet Oral BID  . sodium chloride flush  10-40 mL Intracatheter Q12H  . sodium chloride flush  3 mL Intravenous Q12H   Continuous Infusions: . sodium chloride 40 mL/hr at 02/26/18 1954  .  ceFAZolin (ANCEF) IV 2 g (02/27/18 1449)   PRN Meds:.acetaminophen **OR** acetaminophen, albuterol, guaiFENesin-dextromethorphan, ondansetron **OR** ondansetron (ZOFRAN) IV, oxyCODONE, pentafluoroprop-tetrafluoroeth, sodium chloride flush    Anti-infectives (From admission, onward)   Start     Dose/Rate Route Frequency Ordered Stop   02/09/18 1500  vancomycin (VANCOCIN) IVPB 750 mg/150 ml premix  Status:  Discontinued     750 mg 150 mL/hr over 60 Minutes Intravenous Every 24 hours 02/08/18 1553 02/09/18 1201   02/09/18 1400  ceFAZolin (ANCEF) IVPB 2g/100 mL premix  2 g 200 mL/hr over 30 Minutes Intravenous Every 8 hours 02/09/18 1220 03/22/18 2359   02/09/18 1300  ceFAZolin (ANCEF) 2 g in dextrose 5 % 100 mL IVPB  Status:  Discontinued     2 g 200 mL/hr over 30 Minutes Intravenous Every 8 hours 02/09/18 1201 02/09/18 1220   02/09/18 0000  meropenem (MERREM) 1 g in sodium chloride 0.9 % 100 mL IVPB  Status:  Discontinued     1 g 200 mL/hr over 30 Minutes Intravenous Every 12 hours 02/08/18 1459 02/09/18 0853   02/08/18 1445  vancomycin (VANCOCIN) IVPB 1000 mg/200 mL premix     1,000 mg 200 mL/hr over 60 Minutes Intravenous  Once 02/08/18 1442 02/08/18 1555   02/08/18 1415  meropenem (MERREM) 1 g in sodium chloride 0.9 % 100 mL  IVPB     1 g 200 mL/hr over 30 Minutes Intravenous  Once 02/08/18 1402 02/08/18 1455        Objective:   Vitals:   02/27/18 0124 02/27/18 0532 02/27/18 1017 02/27/18 1335  BP:  (!) 127/100 (!) 135/108   Pulse:  (!) 107 (!) 110   Resp:      Temp:  97.8 F (36.6 C)    TempSrc:  Oral    SpO2: 96% 100%  93%  Weight:  69.3 kg    Height:        Wt Readings from Last 3 Encounters:  02/27/18 69.3 kg     Intake/Output Summary (Last 24 hours) at 02/27/2018 1856 Last data filed at 02/27/2018 1620 Gross per 24 hour  Intake 1357.24 ml  Output 600 ml  Net 757.24 ml     Physical Exam Patient is examined daily including today on 02/27/18 , exams remain the same as of yesterday except that has changed   Gen:-Awake, no acute distress HEENT:- [place].AT, No sclera icterus Nose- [place] 2 L/min Neck-Supple Neck,No JVD,.  Lungs-diminished with scattered rhonchi especially on left, no wheezing CV- S1, S2 normal, regular , tachycardic Abd-  +ve B.Sounds, Abd Soft, No tenderness,    Extremity/Skin:-trace ankle Edema, negative Homans, pedal pulses present  Psych-affect is flat to lethargic, oriented x3 Neuro-generalized weakness without new focal deficits no new focal deficits, no tremors MSK--- lumbar range of motion adequate, palpation over the lumbar spine failed to demonstrate step deformity or crepitus, overlying skin without significant erythema or inflammatory changes, patient does complain of pain with palpation over the lumbar spine and lumbar range of motion   Data Review:   Micro Results Recent Results (from the past 240 hour(s))  Culture, blood (routine x 2)     Status: None   Collection Time: 02/20/18  3:35 PM  Result Value Ref Range Status   Specimen Description BLOOD RIGHT FOREARM  Final   Special Requests   Final    BOTTLES DRAWN AEROBIC AND ANAEROBIC Blood Culture adequate volume   Culture   Final    NO GROWTH 5 DAYS Performed at [facility], [address], [place],  [place] [address]    Report Status 02/25/2018 FINAL  Final  Culture, blood (routine x 2)     Status: None   Collection Time: 02/20/18  3:53 PM  Result Value Ref Range Status   Specimen Description LEFT ANTECUBITAL  Final   Special Requests   Final    BOTTLES DRAWN AEROBIC AND ANAEROBIC Blood Culture adequate volume   Culture   Final    NO GROWTH 5 DAYS Performed at [facility],  [address] [place], [place] [address]    Report Status 02/25/2018 FINAL  Final    Radiology Reports Ct Abdomen Pelvis Wo Contrast  Result Date: 02/08/2018 CLINICAL DATA:  Abdominal pain and fever EXAM: CT ABDOMEN AND PELVIS WITHOUT CONTRAST TECHNIQUE: Multidetector CT imaging of the abdomen and pelvis was performed following the standard protocol without oral or IV contrast. COMPARISON:  None. FINDINGS: Lower chest: There is airspace consolidation throughout the lung bases with several areas of cavitation, likely developing lung abscesses. Several nodular opacities are noted associated with these areas of cavitary consolidation, likely septic emboli. Hepatobiliary: Liver measures 20.5 cm in length. No focal liver lesions are appreciable on this noncontrast enhanced study. The gallbladder is borderline dilated without wall thickening. There is no biliary duct dilatation. Pancreas: No pancreatic mass or inflammatory focus. Spleen: Spleen measures 14.1 x 10.9 x 5.1 cm with a measured splenic volume of 392 cubic cm. No focal splenic lesions are evident. Adrenals/Urinary Tract: Adrenals appear unremarkable bilaterally. 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 No renal mass evident. No hydronephrosis on either side. There is a suspected developing staghorn calculus on the left with somewhat amorphous calcification involving a lower pole calyx on the left extending into the left renal pelvis, best appreciated on coronal imaging. This developing staghorn calculus measures 3.7 x 1.0 cm. No ureteral calculi are evident. Urinary bladder  is decompressed. No urinary bladder wall thickening is appreciable with essentially empty bladder. Stomach/Bowel: There is fluid throughout most small bowel loops. There is no appreciable bowel wall or mesenteric thickening. No evident bowel obstruction. No free air or portal venous air is evident. Vascular/Lymphatic: There is no abdominal aortic aneurysm. No vascular lesions are evident on this noncontrast enhanced study. Note that the attenuation throughout the vascular structures appear slightly diminished which raises question of anemia. No adenopathy is appreciable in abdomen or pelvis. Reproductive: Uterus is anteverted. No pelvic masses evident. There is a slight degree of free fluid in the cul-de-sac region. Other: Appendix is not well seen. There is no periappendiceal region inflammation on this study. No abscess is seen in the abdomen or pelvis. There is no ascites beyond the slight fluid noted in the cul-de-sac region. Musculoskeletal: There are no blastic or lytic bone lesions. No intramuscular or abdominal wall lesions are appreciable. IMPRESSION: Comment: Note that paucity of fat makes assessment in the abdomen and pelvis somewhat less than optimal. 1. Areas of cavitary pneumonia, likely with developing lung abscesses, in the right lower lobe. Areas of consolidation elsewhere with probable septic emboli associated. 2. Prominent liver and spleen. No focal liver and splenic lesions evident. 3. There is evidence of a degree of nephrocalcinosis. Amorphous calcification in a portion of the left renal pelvis and an inferior left calyx suggest developing staghorn calculus. No hydronephrosis on either side. No well-defined ureteral calculi. 4. Fluid in most loops of small bowel. Suspect a degree of ileus or enteritis. No bowel obstruction. No abscess evident in the abdomen or pelvis. 5. Small amount of free fluid in the cul-de-sac may be upper physiologic. Electronically Signed   By: Bretta Bang III M.D.    On: 02/08/2018 14:47   Dg Chest 1 View  Result Date: 02/08/2018 CLINICAL DATA:  Hypotension and tachycardia. Smoking history. Asthma. EXAM: CHEST  1 VIEW COMPARISON:  None. FINDINGS: Heart size is within normal limits. Mild prominence of the LEFT hilum. Patchy opacities bilaterally, most prominent at the RIGHT lung base, majority of which have central lucencies suggest cavitary lesions. IMPRESSION: 1.  Patchy opacities bilaterally, majority of which have central lucencies suggest cavitary masses/infections. Corresponding cavitary consolidations noted at the lung bases of a CT abdomen performed at the time of today's chest x-ray. Recommend CT chest with contrast for complete evaluation. 2. Questionable prominence of the LEFT hilum, additional cavitary mass/consolidation versus central bronchiectasis. Electronically Signed   By: Bary Richard M.D.   On: 02/08/2018 14:48   Dg Chest 2 View  Result Date: 02/24/2018 CLINICAL DATA:  Short of breath EXAM: CHEST - 2 VIEW COMPARISON:  02/20/2018 FINDINGS: Upper normal heart size. Stable left pleural effusion. Tiny right pleural effusion is stable. Patchy airspace opacities throughout both lungs are not significantly changed. No pneumothorax. IMPRESSION: Bilateral airspace disease and bilateral pleural effusions left greater than right are stable. Electronically Signed   By: Jolaine Click M.D.   On: 02/24/2018 15:50   Mr Lumbar Spine W Wo Contrast  Result Date: 02/26/2018 CLINICAL DATA:  Low back pain since prior abnormal MRI 02/08/2018. Unable to walk. EXAM: MRI LUMBAR SPINE WITHOUT AND WITH CONTRAST TECHNIQUE: Multiplanar and multiecho pulse sequences of the lumbar spine were obtained without and with intravenous contrast. CONTRAST:  7 mL Gadavist COMPARISON:  02/08/2018 FINDINGS: Segmentation:  Standard. Alignment:  Physiologic. Vertebrae:  No fracture, evidence of discitis, or bone lesion. Conus medullaris and cauda equina: Conus extends to the L1 level. Conus  and cauda equina appear normal. Paraspinal and other soft tissues: Posterior paraspinal muscle edema bilaterally with mild enhancement on postcontrast imaging. No intramuscular fluid collection or hematoma. Disc levels: Disc spaces: Disc spaces are maintained. T12-L1: No significant disc bulge. No evidence of neural foraminal stenosis. No central canal stenosis. L1-L2: No significant disc bulge. No evidence of neural foraminal stenosis. No central canal stenosis. L2-L3: No significant disc bulge. No evidence of neural foraminal stenosis. No central canal stenosis. L3-L4: No significant disc bulge. No evidence of neural foraminal stenosis. No central canal stenosis. L4-L5: Minimal broad-based disc bulge. No evidence of neural foraminal stenosis. No central canal stenosis. L5-S1: Minimal broad-based disc bulge. No evidence of neural foraminal stenosis. No central canal stenosis. IMPRESSION: 1. No epidural fluid collection.  No discitis or osteomyelitis. 2. Posterior paraspinal muscle edema bilaterally with mild enhancement on postcontrast imaging. No intramuscular fluid collection or hematoma. Differential considerations include muscle strain versus mild myositis. Electronically Signed   By: Elige Ko   On: 02/26/2018 09:53   Mr Lumbar Spine W Wo Contrast  Result Date: 02/08/2018 CLINICAL DATA:  Acute onset of back pain beginning 2 days ago. Study was ordered with contrast but the patient refused. EXAM: MRI LUMBAR SPINE WITHOUT CONTRAST TECHNIQUE: Multiplanar, multisequence MR imaging of the lumbar spine was performed. No intravenous contrast was administered. COMPARISON:  None. FINDINGS: Segmentation:  5 lumbar type vertebral bodies. Alignment:  Normal Vertebrae:  Normal Conus medullaris and cauda equina: Conus extends to the L1 level. Conus and cauda equina appear normal. Paraspinal and other soft tissues: There is lower paraspinous muscle edema as might be seen with a muscular strain. This appears symmetric  from right to left. Disc levels: No abnormality at L3-4 or above. L4-5: Very minimal disc bulge.  No stenosis or neural compression. L5-S1: Very minimal disc bulge.  No stenosis or neural compression. IMPRESSION: Minimal disc bulges at L4-5 and L5-S1. No stenosis or neural compression. Paraspinous muscle edema on both sides in the lower lumbar region as might be seen with a muscular strain. This is nonspecific. The differential diagnosis does include infection, but the symmetric  nature would be unusual. The patient refused contrast administration. Electronically Signed   By: Paulina Fusi M.D.   On: 02/08/2018 13:56   Dg Chest Port 1 View  Result Date: 02/20/2018 CLINICAL DATA:  Sepsis, endocarditis, cavitary pneumonia with septic emboli and fever. EXAM: PORTABLE CHEST 1 VIEW COMPARISON:  02/08/2018 FINDINGS: Multiple cavitary lesions again identified in both lungs with associated airspace disease in a pattern likely representing septic emboli with multiple areas of cavitary infection. There is a new left lateral pleural effusion which appears likely loculated and may represent an empyema. Further evaluation with CT of the chest with contrast may be helpful. The heart size is stable. IMPRESSION: Persistent appearance of bilateral cavitary pneumonia likely representing septic emboli. There is a new loculated left lateral pleural effusion that may represent a developing empyema. Further evaluation with CT of the chest with contrast may be helpful. Electronically Signed   By: Irish Lack M.D.   On: 02/20/2018 19:11   [place] Ekg Site Rite  Result Date: 02/26/2018 If Site Rite image not attached, placement could not be confirmed due to current cardiac rhythm.    CBC Recent Labs  Lab 02/22/18 0941 02/25/18 0643  WBC 12.3* 11.1*  HGB 8.0* 9.8*  HCT 26.9* 33.3*  PLT 198 300  MCV 87.6 90.0  MCH 26.1 26.5  MCHC 29.7* 29.4*  RDW 20.9* 20.4*    Chemistries  Recent Labs  Lab 02/22/18 0941  02/25/18 0643  NA 134* 137  K 4.1 3.8  CL 102 105  CO2 23 25  GLUCOSE 111* 100*  BUN 5* 6  CREATININE 0.47 0.63  CALCIUM 7.6* 8.2*    Tyus Kallam M.D on 02/27/2018 at 6:56 PM  Go to [email or web address] - for contact info  [facility] - Office  [phone]

## 2018-02-28 DIAGNOSIS — R6521 Severe sepsis with septic shock: Secondary | ICD-10-CM

## 2018-02-28 DIAGNOSIS — D696 Thrombocytopenia, unspecified: Secondary | ICD-10-CM

## 2018-02-28 DIAGNOSIS — E43 Unspecified severe protein-calorie malnutrition: Secondary | ICD-10-CM

## 2018-02-28 DIAGNOSIS — I76 Septic arterial embolism: Secondary | ICD-10-CM

## 2018-02-28 DIAGNOSIS — A419 Sepsis, unspecified organism: Secondary | ICD-10-CM

## 2018-02-28 DIAGNOSIS — R768 Other specified abnormal immunological findings in serum: Secondary | ICD-10-CM

## 2018-02-28 MED ORDER — IPRATROPIUM BROMIDE 0.02 % IN SOLN: 0.5000 mg | Freq: Three times a day (TID) | RESPIRATORY_TRACT | Status: DC

## 2018-02-28 MED ORDER — HYDROXYZINE HCL 25 MG PO TABS
25.0000 mg | ORAL_TABLET | Freq: Two times a day (BID) | ORAL | Status: DC | PRN
  Administered 2018-03-08 – 2018-03-10 (×3): 25 mg via ORAL
  Filled 2018-02-28 (×3): qty 1

## 2018-02-28 MED ORDER — LEVALBUTEROL HCL 0.63 MG/3ML IN NEBU: 0.6300 mg | INHALATION_SOLUTION | Freq: Three times a day (TID) | RESPIRATORY_TRACT | Status: DC

## 2018-02-28 MED ORDER — BUDESONIDE 0.5 MG/2ML IN SUSP
0.5000 mg | Freq: Two times a day (BID) | RESPIRATORY_TRACT | Status: DC
  Administered 2018-02-28: 0.5 mg via RESPIRATORY_TRACT
  Filled 2018-02-28 (×4): qty 2

## 2018-02-28 NOTE — Progress Notes (Signed)
PROGRESS NOTE  Amy Mcknight TIR:[id number] DOB: 08/08/1995 DOA: 02/08/2018 PCP: Patient, No Pcp Per  Brief History:  23 y.o.femalewith medical history significant forsubstance use disorderwho presents to the ED with 2 days of right lower back pain. Patient states symptoms began with acute onset while at rest. Symptoms have been persistent. She has noted associated diaphoresis, chills, shortness of breath, cough productive of brown sputum, and right chest wall pain. She denies any rashes or obvious skin changes. She reports good urine output without dysuria. She denies any abdominal pain, diarrhea, or constipation.  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 She continues to use recreational drugs by snorting nasally. She denies any alcohol use. She reports a history of withdrawal from opiates in the past.And is noted to be positive for marijuana and opiates on her UDS.  She is noted to be anemicand is status post 2 unit PRBC transfusion with improvement noted. No overt bleeding currently identified, but patient is complaining of some epigastric abdominal pain. As result of this pain, she has not been eating very much. She denies any nausea or vomiting.GI has evaluated patient with no need for endoscopy noted at this time.  she refuses bathing and laboratory draws. She has been using a bedpan and has not been eating very well. She has been more somnolent from time to time possibly due to oversedation  Assessment/Plan: Sepsis with early Septic shock  -due to MSSA bacteremia, tricuspid vegetation, paravertebral myositis and cavitary pneumonia -sepsis pathophysiology and septic shock has resolved -Pneumonia lesions could also represent septic emboli -Case discussedpreviouslywith infectious disease doctor  -continue IV Ancef for total of 6 weeks from date of first negative culture which was 02/12/2018 in a supervised setting ([facility]) -2D echo positive  for tricuspid vegetation and moderate regurgitation. -No further fevers,repeat blood cultures from 2/18/2020Negativeto date, WBC is down to 11.2  from 21.9  MSSA Bacteremia/TV (native valve) endocarditis -continue cefazolin x 6 weeks from last neg blood culture (02/12/18)  AKI (acute kidney injury) (HCC) -resolved -serum creatinine peaked 2.61  Substance use disorder/opiate withdrawal syndrome  -she appears to be resting comfortably for extended/long periods of time without any evidence of pain, patient has been weaned off Dilaudid,  -okay to continue PRN low-dose oxycodone continuemethocarbamol 750mg   4 times daily, Cymbalta 30 mg twice daily and gabapentin 300 mg 3 times daily to help blunt withdrawal symptoms,  -c/n hydroxyzine 25 mg nightly -UDS on 02/24/2018 shows opiates --- this was done after there was suspicion of possible inhalation/snorting of drugs in the room on the telemetry sitter camera--- patient did receive Dilaudid here in the hospital, cannot rule out concomitant inhalation/snorting of opiates on 02/24/18 -Dilaudid has been discontinued patient only has PRN oxycodone 5 mg at this time -she is out of the window for active withdrawal symptoms  Tobacco Abuse-- -Extensive cessation counseling provided--continue nicotine patch   positive hepatitis C antibody--- -Quantitative viral load test undetectable;-No treatment needed , outpatient follow-up with ID/gastroenterology advised  Generalized weakness and debility--- -continues to refuse from time to time to work with physical therapy, patient continues to refuse to participate in ADLs often, wanting to have staff do more and more for her--- encourage to participate more in ADLs and physical therapy, pt now complaints of back pain as of 02/25/2018, patient currently has MSSA bacteremia and back pain--- lumbar MRI from 02/26/2018 without any evidence of  epidural abscess or significant inflammation ,  (  prior lumbar MRI  from 02/08/2018 was without contrast as patient refused contrast at the time )  Severe Anemia -suspecthemolysis in the setting of acute infection-stable -Anemia panelrevealed some iron deficiency and stool occult is negative -2 unit PRBC orderedon 02/14/18--- Hgb remains stable post prior transfusion -Appreciate GI evaluation with no recommendations for endoscopy at this time -Continue p.o. PPI, -Ferahemegiven during this admission due to iron deficiency  Depression/Anxiety---- -stable,  c/nCymbalta for depression,  -may use hydroxyzine for sleep and anxiety  -add hydroxyzine bid prn    Disposition Plan:   Home in 25 days Family Communication:  No Family at bedside  Consultants:  ID  Code Status:  FULL   DVT Prophylaxis:  SCDs   Procedures: As Listed in Progress Note Above  Antibiotics: Cefazolin 2/7>>> vanco 2/7   Total time spent 35 minutes.  Greater than 50% spent face to face counseling and coordinating care.     Subjective: Pt c/o anxiety, wants IV ativan or xanax.  Denies cp, sob, n/v/d.  Had BM today.  No headache.  Back pain about same  Objective: Vitals:   02/28/18 0500 02/28/18 0624 02/28/18 0632 02/28/18 1509  BP:  (!) 123/105 (!) 123/105   Pulse:  100 98   Resp:  (!) 22 19   Temp:   99 F (37.2 C)   TempSrc:   Axillary   SpO2:  100%  100%  Weight: 69.4 kg     Height:        Intake/Output Summary (Last 24 hours) at 02/28/2018 1808 Last data filed at 02/28/2018 0700 Gross per 24 hour  Intake 986.15 ml  Output 200 ml  Net 786.15 ml   Weight change: 0.1 kg Exam:   General:  Pt is alert, follows commands appropriately, not in acute distress  HEENT: No icterus, No thrush, No neck mass, [place]/AT  Cardiovascular: RRR, S1/S2, no rubs, no gallops  Respiratory: bilateral rales. Bilateral exp wheeze  Abdomen: Soft/+BS, non tender, non distended, no guarding  Extremities: 1+LE edema, No lymphangitis, No petechiae, No rashes, no  synovitis   Data Reviewed: I have personally reviewed following labs and imaging studies Basic Metabolic Panel: Recent Labs  Lab 02/22/18 0941 02/25/18 0643  NA 134* 137  K 4.1 3.8  CL 102 105  CO2 23 25  GLUCOSE 111* 100*  BUN 5* 6  CREATININE 0.47 0.63  CALCIUM 7.6* 8.2*   Liver Function Tests: No results for input(s): AST, ALT, ALKPHOS, BILITOT, PROT, ALBUMIN in the last 168 hours. No results for input(s): LIPASE, AMYLASE in the last 168 hours. No results for input(s): AMMONIA in the last 168 hours. Coagulation Profile: No results for input(s): INR, PROTIME in the last 168 hours. CBC: Recent Labs  Lab 02/22/18 0941 02/25/18 0643  WBC 12.3* 11.1*  HGB 8.0* 9.8*  HCT 26.9* 33.3*  MCV 87.6 90.0  PLT 198 300   Cardiac Enzymes: No results for input(s): CKTOTAL, CKMB, CKMBINDEX, TROPONINI in the last 168 hours. BNP: Invalid input(s): POCBNP CBG: No results for input(s): GLUCAP in the last 168 hours. HbA1C: No results for input(s): HGBA1C in the last 72 hours. Urine analysis:    Component Value Date/Time   COLORURINE YELLOW 02/24/2018 1845   APPEARANCEUR HAZY (A) 02/24/2018 1845   LABSPEC 1.020 02/24/2018 1845   PHURINE 5.0 02/24/2018 1845   GLUCOSEU NEGATIVE 02/24/2018 1845   HGBUR MODERATE (A) 02/24/2018 1845   BILIRUBINUR NEGATIVE 02/24/2018 1845   KETONESUR NEGATIVE 02/24/2018 1845   PROTEINUR 30 (  A) 02/24/2018 1845   NITRITE NEGATIVE 02/24/2018 1845   LEUKOCYTESUR SMALL (A) 02/24/2018 1845   Sepsis Labs: (procalcitonin:4,lacticidven:4) ) Recent Results (from the past 240 hour(s))  Culture, blood (routine x 2)     Status: None   Collection Time: 02/20/18  3:35 PM  Result Value Ref Range Status   Specimen Description BLOOD RIGHT FOREARM  Final   Special Requests   Final    BOTTLES DRAWN AEROBIC AND ANAEROBIC Blood Culture adequate volume   Culture   Final    NO GROWTH 5 DAYS Performed at [facility], [address] [place], [place]  [address]    Report Status 02/25/2018 FINAL  Final  Culture, blood (routine x 2)     Status: None   Collection Time: 02/20/18  3:53 PM  Result Value Ref Range Status   Specimen Description LEFT ANTECUBITAL  Final   Special Requests   Final    BOTTLES DRAWN AEROBIC AND ANAEROBIC Blood Culture adequate volume   Culture   Final    NO GROWTH 5 DAYS Performed at [facility], [address] [place], [place] [address]    Report Status 02/25/2018 FINAL  Final     Scheduled Meds: . diclofenac  1 patch Transdermal BID  . DULoxetine  30 mg Oral BID  . gabapentin  300 mg Oral TID  . hydrOXYzine  25 mg Oral QHS  . ipratropium-albuterol  3 mL Nebulization Q6H  . methocarbamol  750 mg Oral QID  . metoprolol tartrate  50 mg Oral BID  . multivitamin with minerals  1 tablet Oral Daily  . nicotine  7 mg Transdermal Daily  . pantoprazole  40 mg Oral BID  . senna-docusate  2 tablet Oral BID  . sodium chloride flush  10-40 mL Intracatheter Q12H  . sodium chloride flush  3 mL Intravenous Q12H   Continuous Infusions: . sodium chloride 40 mL/hr at 02/27/18 2248  .  ceFAZolin (ANCEF) IV 2 g (02/28/18 1311)    Procedures/Studies: Ct Abdomen Pelvis Wo Contrast  Result Date: 02/08/2018 CLINICAL DATA:  Abdominal pain and fever EXAM: CT ABDOMEN AND PELVIS WITHOUT CONTRAST TECHNIQUE: Multidetector CT imaging of the abdomen and pelvis was performed following the standard protocol without oral or IV contrast. COMPARISON:  None. FINDINGS: Lower chest: There is airspace consolidation throughout the lung bases with several areas of cavitation, likely developing lung abscesses. Several nodular opacities are noted associated with these areas of cavitary consolidation, likely septic emboli. Hepatobiliary: Liver measures 20.5 cm in length. No focal liver lesions are appreciable on this noncontrast enhanced study. The gallbladder is borderline dilated without wall thickening. There is no biliary duct dilatation. Pancreas:  No pancreatic mass or inflammatory focus. Spleen: Spleen measures 14.1 x 10.9 x 5.1 cm with a measured splenic volume of 392 cubic cm. No focal splenic lesions are evident. Adrenals/Urinary Tract: Adrenals appear unremarkable bilaterally. 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 No renal mass evident. No hydronephrosis on either side. There is a suspected developing staghorn calculus on the left with somewhat amorphous calcification involving a lower pole calyx on the left extending into the left renal pelvis, best appreciated on coronal imaging. This developing staghorn calculus measures 3.7 x 1.0 cm. No ureteral calculi are evident. Urinary bladder is decompressed. No urinary bladder wall thickening is appreciable with essentially empty bladder. Stomach/Bowel: There is fluid throughout most small bowel loops. There is no appreciable bowel wall or mesenteric thickening. No evident bowel obstruction. No free air or portal venous  air is evident. Vascular/Lymphatic: There is no abdominal aortic aneurysm. No vascular lesions are evident on this noncontrast enhanced study. Note that the attenuation throughout the vascular structures appear slightly diminished which raises question of anemia. No adenopathy is appreciable in abdomen or pelvis. Reproductive: Uterus is anteverted. No pelvic masses evident. There is a slight degree of free fluid in the cul-de-sac region. Other: Appendix is not well seen. There is no periappendiceal region inflammation on this study. No abscess is seen in the abdomen or pelvis. There is no ascites beyond the slight fluid noted in the cul-de-sac region. Musculoskeletal: There are no blastic or lytic bone lesions. No intramuscular or abdominal wall lesions are appreciable. IMPRESSION: Comment: Note that paucity of fat makes assessment in the abdomen and pelvis somewhat less than optimal. 1. Areas of cavitary pneumonia, likely with developing lung abscesses, in the right lower lobe. Areas of  consolidation elsewhere with probable septic emboli associated. 2. Prominent liver and spleen. No focal liver and splenic lesions evident. 3. There is evidence of a degree of nephrocalcinosis. Amorphous calcification in a portion of the left renal pelvis and an inferior left calyx suggest developing staghorn calculus. No hydronephrosis on either side. No well-defined ureteral calculi. 4. Fluid in most loops of small bowel. Suspect a degree of ileus or enteritis. No bowel obstruction. No abscess evident in the abdomen or pelvis. 5. Small amount of free fluid in the cul-de-sac may be upper physiologic. Electronically Signed   By: Bretta Bang III M.D.   On: 02/08/2018 14:47   Dg Chest 1 View  Result Date: 02/08/2018 CLINICAL DATA:  Hypotension and tachycardia. Smoking history. Asthma. EXAM: CHEST  1 VIEW COMPARISON:  None. FINDINGS: Heart size is within normal limits. Mild prominence of the LEFT hilum. Patchy opacities bilaterally, most prominent at the RIGHT lung base, majority of which have central lucencies suggest cavitary lesions. IMPRESSION: 1. Patchy opacities bilaterally, majority of which have central lucencies suggest cavitary masses/infections. Corresponding cavitary consolidations noted at the lung bases of a CT abdomen performed at the time of today's chest x-ray. Recommend CT chest with contrast for complete evaluation. 2. Questionable prominence of the LEFT hilum, additional cavitary mass/consolidation versus central bronchiectasis. Electronically Signed   By: Bary Richard M.D.   On: 02/08/2018 14:48   Dg Chest 2 View  Result Date: 02/24/2018 CLINICAL DATA:  Short of breath EXAM: CHEST - 2 VIEW COMPARISON:  02/20/2018 FINDINGS: Upper normal heart size. Stable left pleural effusion. Tiny right pleural effusion is stable. Patchy airspace opacities throughout both lungs are not significantly changed. No pneumothorax. IMPRESSION: Bilateral airspace disease and bilateral pleural effusions left  greater than right are stable. Electronically Signed   By: Jolaine Click M.D.   On: 02/24/2018 15:50   Mr Lumbar Spine W Wo Contrast  Result Date: 02/26/2018 CLINICAL DATA:  Low back pain since prior abnormal MRI 02/08/2018. Unable to walk. EXAM: MRI LUMBAR SPINE WITHOUT AND WITH CONTRAST TECHNIQUE: Multiplanar and multiecho pulse sequences of the lumbar spine were obtained without and with intravenous contrast. CONTRAST:  7 mL Gadavist COMPARISON:  02/08/2018 FINDINGS: Segmentation:  Standard. Alignment:  Physiologic. Vertebrae:  No fracture, evidence of discitis, or bone lesion. Conus medullaris and cauda equina: Conus extends to the L1 level. Conus and cauda equina appear normal. Paraspinal and other soft tissues: Posterior paraspinal muscle edema bilaterally with mild enhancement on postcontrast imaging. No intramuscular fluid collection or hematoma. Disc levels: Disc spaces: Disc spaces are maintained. T12-L1: No significant disc bulge.  No evidence of neural foraminal stenosis. No central canal stenosis. L1-L2: No significant disc bulge. No evidence of neural foraminal stenosis. No central canal stenosis. L2-L3: No significant disc bulge. No evidence of neural foraminal stenosis. No central canal stenosis. L3-L4: No significant disc bulge. No evidence of neural foraminal stenosis. No central canal stenosis. L4-L5: Minimal broad-based disc bulge. No evidence of neural foraminal stenosis. No central canal stenosis. L5-S1: Minimal broad-based disc bulge. No evidence of neural foraminal stenosis. No central canal stenosis. IMPRESSION: 1. No epidural fluid collection.  No discitis or osteomyelitis. 2. Posterior paraspinal muscle edema bilaterally with mild enhancement on postcontrast imaging. No intramuscular fluid collection or hematoma. Differential considerations include muscle strain versus mild myositis. Electronically Signed   By: Elige Ko   On: 02/26/2018 09:53   Mr Lumbar Spine W Wo  Contrast  Result Date: 02/08/2018 CLINICAL DATA:  Acute onset of back pain beginning 2 days ago. Study was ordered with contrast but the patient refused. EXAM: MRI LUMBAR SPINE WITHOUT CONTRAST TECHNIQUE: Multiplanar, multisequence MR imaging of the lumbar spine was performed. No intravenous contrast was administered. COMPARISON:  None. FINDINGS: Segmentation:  5 lumbar type vertebral bodies. Alignment:  Normal Vertebrae:  Normal Conus medullaris and cauda equina: Conus extends to the L1 level. Conus and cauda equina appear normal. Paraspinal and other soft tissues: There is lower paraspinous muscle edema as might be seen with a muscular strain. This appears symmetric from right to left. Disc levels: No abnormality at L3-4 or above. L4-5: Very minimal disc bulge.  No stenosis or neural compression. L5-S1: Very minimal disc bulge.  No stenosis or neural compression. IMPRESSION: Minimal disc bulges at L4-5 and L5-S1. No stenosis or neural compression. Paraspinous muscle edema on both sides in the lower lumbar region as might be seen with a muscular strain. This is nonspecific. The differential diagnosis does include infection, but the symmetric nature would be unusual. The patient refused contrast administration. Electronically Signed   By: Paulina Fusi M.D.   On: 02/08/2018 13:56   Dg Chest Port 1 View  Result Date: 02/20/2018 CLINICAL DATA:  Sepsis, endocarditis, cavitary pneumonia with septic emboli and fever. EXAM: PORTABLE CHEST 1 VIEW COMPARISON:  02/08/2018 FINDINGS: Multiple cavitary lesions again identified in both lungs with associated airspace disease in a pattern likely representing septic emboli with multiple areas of cavitary infection. There is a new left lateral pleural effusion which appears likely loculated and may represent an empyema. Further evaluation with CT of the chest with contrast may be helpful. The heart size is stable. IMPRESSION: Persistent appearance of bilateral cavitary pneumonia  likely representing septic emboli. There is a new loculated left lateral pleural effusion that may represent a developing empyema. Further evaluation with CT of the chest with contrast may be helpful. Electronically Signed   By: Irish Lack M.D.   On: 02/20/2018 19:11   [place] Ekg Site Rite  Result Date: 02/26/2018 If Site Rite image not attached, placement could not be confirmed due to current cardiac rhythm.   Catarina Hartshorn, DO  [facility] Pager [phone]  If 7PM-7AM, please contact night-coverage [email or web address] Password [email or web address] 02/28/2018, 6:08 PM   LOS: 20 days

## 2018-02-28 NOTE — Progress Notes (Signed)
PT Cancellation Note  Patient Details Name: Amy Mcknight MRN: [id number] DOB: [birth year]   Cancelled Treatment:    Reason Eval/Treat Not Completed: Patient declined, no reason specified.  Patient declined therapy stating she has been up earlier today and not feeling welland agreed to participate tomorrow in AM - RN notified.  Will check back tomorrow.   2:52 PM, 02/28/18 Ocie Bob, MPT Physical Therapist with [facility] [address] [phone] office [phone] mobile phone

## 2018-03-01 ENCOUNTER — Inpatient Hospital Stay (HOSPITAL_COMMUNITY): Payer: Self-pay

## 2018-03-01 MED ORDER — METOPROLOL TARTRATE 50 MG PO TABS
75.0000 mg | ORAL_TABLET | Freq: Two times a day (BID) | ORAL | Status: DC
  Administered 2018-03-01 – 2018-03-26 (×50): 75 mg via ORAL
  Filled 2018-03-01 (×52): qty 1

## 2018-03-01 MED ORDER — IPRATROPIUM-ALBUTEROL 0.5-2.5 (3) MG/3ML IN SOLN
3.0000 mL | Freq: Four times a day (QID) | RESPIRATORY_TRACT | Status: DC | PRN
  Administered 2018-03-25 – 2018-03-26 (×2): 3 mL via RESPIRATORY_TRACT
  Filled 2018-03-01 (×2): qty 3

## 2018-03-01 MED ORDER — ALBUTEROL SULFATE (2.5 MG/3ML) 0.083% IN NEBU
2.5000 mg | INHALATION_SOLUTION | RESPIRATORY_TRACT | Status: DC | PRN
  Administered 2018-03-05 – 2018-03-25 (×2): 2.5 mg via RESPIRATORY_TRACT
  Filled 2018-03-01 (×2): qty 3

## 2018-03-01 NOTE — Progress Notes (Signed)
Patient continues to request that her oxygen be turned up. Upon arrival to give breathing treatment patient found in no distress and 99% on 4 lpm. Patient wanted her oxygen turned up to 5 lpm. Explained to patient that oxygen is not turned up unless truly needed. Patient will be placed on 3 lpm after treatment.

## 2018-03-01 NOTE — Progress Notes (Signed)
PROGRESS NOTE  Amy Mcknight QMV:[id number] DOB: 18-Apr-1995 DOA: 02/08/2018 PCP: Patient, No Pcp Per  Brief History:  23 y.o.femalewith medical history significant forsubstance use disorderwho presents to the ED with 2 days of right lower back pain. Patient states symptoms began with acute onset while at rest. Symptoms have been persistent. She has noted associated diaphoresis, chills, shortness of breath, cough productive of brown sputum, and right chest wall pain. She denies any rashes or obvious skin changes. She reports good urine output without dysuria. She denies any abdominal pain, diarrhea, or constipation.  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 She continues to use recreational drugs by snorting nasally. She denies any alcohol use. She reports a history of withdrawal from opiates in the past.And is noted to be positive for marijuana and opiates on her UDS.  She is noted to be anemicand is status post 2 unit PRBC transfusion with improvement noted. No overt bleeding currently identified, but patient is complaining of some epigastric abdominal pain. As result of this pain, she has not been eating very much. She denies any nausea or vomiting.GI has evaluated patient with no need for endoscopy noted at this time. she refuses bathing and laboratory draws. She has been using a bedpan and has not been eating very well. She has been more somnolent from time to time possibly due to oversedation  Assessment/Plan: Sepsis with early Septic shock  -due to MSSA bacteremia, tricuspid vegetation, paravertebral myositis and cavitary pneumonia -sepsis pathophysiology and septic shock has resolved  -Case discussedpreviouslywith infectious disease doctor  -continue IV Ancef for total of 6 weeks from date of first negative culture which was 02/12/2018 in a supervised setting ([facility]) -2D echo positive for tricuspid vegetation and moderate  regurgitation. 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 -repeat CXR as pt c/o dyspnea -repeat labs  AKI (acute kidney injury) (HCC) -resolved -serum creatinine peaked 2.61  Substance use disorder/opiate withdrawal syndrome  -she appears to be resting comfortably for extended/long periods of time without any evidence of pain, patient has been weaned off Dilaudid,  -okay to continuePRN low-dose oxycodone continuemethocarbamol  4 times daily, Cymbalta 30 mg twice daily and gabapentin 300 mg 3 times daily to help blunt withdrawal symptoms,  -c/n hydroxyzine 25 mg nightly -UDS on 02/24/2018 shows opiates --- this was done after there was suspicion of possible inhalation/snorting of drugs in the room on the telemetry sitter camera--- patient did receive Dilaudid here in the hospital, cannot rule out concomitant inhalation/snorting of opiates on 02/24/18 -Dilaudid has been discontinued patient only has PRN oxycodone 5 mg at this time -she is out of the window for active withdrawal symptoms  Tobacco Abuse-- -Extensive cessation counseling provided--continue nicotine patch   positive hepatitis C antibody--- -Quantitative viral load test undetectable;-No treatment needed , outpatient follow-up with ID/gastroenterology advised  Generalized weakness and debility--- -continues to intermittently refuse to work with physical therapy,  -patient continues to refuse to participate in ADLs often, wanting to have staff do more and more for her -complaints of back pain as of 02/25/2018, patient currently has MSSA bacteremia and back pain--- lumbar MRI from 02/26/2018 without any evidence of epidural abscess or significant inflammation , (prior  lumbar MRI from 02/08/2018 was without contrast  as patient refused contrast at the time ) -OOB with meals  Severe Anemia -suspecthemolysis in the setting of acute infection-stable -Anemia panelrevealed some iron deficiency and stool occult is negative -2 unit PRBC orderedon 02/14/18--- Hgb remains stable post prior transfusion -Appreciate GI evaluation with no recommendations for endoscopy at this time -Continue p.o. PPI,  --Ferahemegiven during this admission due to iron deficiency  Depression/Anxiety---- -stable -continue cymbalta  -may use hydroxyzine for sleep and anxiety  -add hydroxyzine bid prn    Disposition Plan:   Home in 25 days Family Communication:  No Family at bedside  Consultants:  ID  Code Status:  FULL   DVT Prophylaxis:  SCDs   Procedures: As Listed in Progress Note Above  Antibiotics: Cefazolin 2/7>>> vanco 2/7      Subjective: Pt c/o intermittent sob.  Denies cp, f/c, headache, neck pain.  No vomiting or diarrhea.  Claims to have worked with PT today but documentation contradicts claim.  No abd pain.  No f/c dysuria  Objective: Vitals:   02/28/18 2132 03/01/18 0146 03/01/18 0411 03/01/18 0500  BP:   (!) 134/109   Pulse:   (!) 107   Resp:   19   Temp: 98.4 F (36.9 C)  97.8 F (36.6 C)   TempSrc: Oral  Oral   SpO2:  99% 100%   Weight:    70.8 kg  Height:        Intake/Output Summary (Last 24 hours) at 03/01/2018 1731 Last data filed at 03/01/2018 0400 Gross per 24 hour  Intake 1830.54 ml  Output -  Net 1830.54 ml   Weight change: 1.4 kg Exam:   General:  Pt is alert, follows commands appropriately, not in acute distress  HEENT: No icterus, No thrush, No neck mass, [place]/AT  Cardiovascular: RRR, S1/S2, no rubs, no gallops  Respiratory: bibasilar rales, no wheeze  Abdomen: Soft/+BS, non tender, non distended, no guarding  Extremities: No edema, No lymphangitis, No petechiae, No rashes, no synovitis   Data  Reviewed: I have personally reviewed following labs and imaging studies Basic Metabolic Panel: Recent Labs  Lab 02/25/18 0643  NA 137  K 3.8  CL 105  CO2 25  GLUCOSE 100*  BUN 6  CREATININE 0.63  CALCIUM 8.2*   Liver Function Tests: No results for input(s): AST, ALT, ALKPHOS, BILITOT, PROT, ALBUMIN in the last 168 hours. No results for input(s): LIPASE, AMYLASE in the last 168 hours. No results for input(s): AMMONIA in the last 168 hours. Coagulation Profile: No results for input(s): INR, PROTIME in the last 168 hours. CBC: Recent Labs  Lab 02/25/18 0643  WBC 11.1*  HGB 9.8*  HCT 33.3*  MCV 90.0  PLT 300   Cardiac Enzymes: No results for input(s): CKTOTAL, CKMB, CKMBINDEX, TROPONINI in the last 168 hours. BNP: Invalid input(s): POCBNP CBG: No results for input(s): GLUCAP in the last 168 hours. HbA1C: No results for input(s): HGBA1C in the last 72 hours. Urine analysis:    Component Value Date/Time   COLORURINE YELLOW 02/24/2018 1845   APPEARANCEUR HAZY (A) 02/24/2018 1845   LABSPEC 1.020 02/24/2018 1845   PHURINE 5.0 02/24/2018 1845   GLUCOSEU NEGATIVE 02/24/2018 1845   HGBUR MODERATE (A) 02/24/2018 1845   BILIRUBINUR NEGATIVE 02/24/2018 1845   KETONESUR NEGATIVE 02/24/2018 1845   PROTEINUR 30 (A) 02/24/2018 1845   NITRITE NEGATIVE 02/24/2018 1845   LEUKOCYTESUR SMALL (A) 02/24/2018 1845   Sepsis Labs: (procalcitonin:4,lacticidven:4) ) Recent Results (from the past 240 hour(s))  Culture,  blood (routine x 2)     Status: None   Collection Time: 02/20/18  3:35 PM  Result Value Ref Range Status   Specimen Description BLOOD RIGHT FOREARM  Final   Special Requests   Final    BOTTLES DRAWN AEROBIC AND ANAEROBIC Blood Culture adequate volume   Culture   Final    NO GROWTH 5 DAYS Performed at [facility], [address] [place], [place] [address]    Report Status 02/25/2018 FINAL  Final  Culture, blood (routine x 2)     Status: None   Collection  Time: 02/20/18  3:53 PM  Result Value Ref Range Status   Specimen Description LEFT ANTECUBITAL  Final   Special Requests   Final    BOTTLES DRAWN AEROBIC AND ANAEROBIC Blood Culture adequate volume   Culture   Final    NO GROWTH 5 DAYS Performed at [facility], [address], [place], [place] [address]    Report Status 02/25/2018 FINAL  Final     Scheduled Meds: . budesonide (PULMICORT) nebulizer solution  0.5 mg Nebulization BID  . diclofenac  1 patch Transdermal BID  . DULoxetine  30 mg Oral BID  . gabapentin  300 mg Oral TID  . hydrOXYzine  25 mg Oral QHS  . ipratropium-albuterol  3 mL Nebulization Q6H  . methocarbamol  750 mg Oral QID  . metoprolol tartrate  50 mg Oral BID  . multivitamin with minerals  1 tablet Oral Daily  . nicotine  7 mg Transdermal Daily  . pantoprazole  40 mg Oral BID  . senna-docusate  2 tablet Oral BID  . sodium chloride flush  10-40 mL Intracatheter Q12H  . sodium chloride flush  3 mL Intravenous Q12H   Continuous Infusions: . sodium chloride 40 mL/hr at 03/01/18 0042  .  ceFAZolin (ANCEF) IV 2 g (03/01/18 1424)    Procedures/Studies: Ct Abdomen Pelvis Wo Contrast  Result Date: 02/08/2018 CLINICAL DATA:  Abdominal pain and fever EXAM: CT ABDOMEN AND PELVIS WITHOUT CONTRAST TECHNIQUE: Multidetector CT imaging of the abdomen and pelvis was performed following the standard protocol without oral or IV contrast. COMPARISON:  None. FINDINGS: Lower chest: There is airspace consolidation throughout the lung bases with several areas of cavitation, likely developing lung abscesses. Several nodular opacities are noted associated with these areas of cavitary consolidation, likely septic emboli. Hepatobiliary: Liver measures 20.5 cm in length. No focal liver lesions are appreciable on this noncontrast enhanced study. The gallbladder is borderline dilated without wall thickening. There is no biliary duct dilatation. Pancreas: No pancreatic mass or inflammatory  focus. Spleen: Spleen measures 14.1 x 10.9 x 5.1 cm with a measured splenic volume of 392 cubic cm. No focal splenic lesions are evident. Adrenals/Urinary Tract: Adrenals appear unremarkable bilaterally. 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 No renal mass evident. No hydronephrosis on either side. There is a suspected developing staghorn calculus on the left with somewhat amorphous calcification involving a lower pole calyx on the left extending into the left renal pelvis, best appreciated on coronal imaging. This developing staghorn calculus measures 3.7 x 1.0 cm. No ureteral calculi are evident. Urinary bladder is decompressed. No urinary bladder wall thickening is appreciable with essentially empty bladder. Stomach/Bowel: There is fluid throughout most small bowel loops. There is no appreciable bowel wall or mesenteric thickening. No evident bowel obstruction. No free air or portal venous air is evident. Vascular/Lymphatic: There is no abdominal aortic aneurysm. No vascular lesions are evident on this noncontrast enhanced study.  Note that the attenuation throughout the vascular structures appear slightly diminished which raises question of anemia. No adenopathy is appreciable in abdomen or pelvis. Reproductive: Uterus is anteverted. No pelvic masses evident. There is a slight degree of free fluid in the cul-de-sac region. Other: Appendix is not well seen. There is no periappendiceal region inflammation on this study. No abscess is seen in the abdomen or pelvis. There is no ascites beyond the slight fluid noted in the cul-de-sac region. Musculoskeletal: There are no blastic or lytic bone lesions. No intramuscular or abdominal wall lesions are appreciable. IMPRESSION: Comment: Note that paucity of fat makes assessment in the abdomen and pelvis somewhat less than optimal. 1. Areas of cavitary pneumonia, likely with developing lung abscesses, in the right lower lobe. Areas of consolidation elsewhere with  probable septic emboli associated. 2. Prominent liver and spleen. No focal liver and splenic lesions evident. 3. There is evidence of a degree of nephrocalcinosis. Amorphous calcification in a portion of the left renal pelvis and an inferior left calyx suggest developing staghorn calculus. No hydronephrosis on either side. No well-defined ureteral calculi. 4. Fluid in most loops of small bowel. Suspect a degree of ileus or enteritis. No bowel obstruction. No abscess evident in the abdomen or pelvis. 5. Small amount of free fluid in the cul-de-sac may be upper physiologic. Electronically Signed   By: Bretta Bang III M.D.   On: 02/08/2018 14:47   Dg Chest 1 View  Result Date: 02/08/2018 CLINICAL DATA:  Hypotension and tachycardia. Smoking history. Asthma. EXAM: CHEST  1 VIEW COMPARISON:  None. FINDINGS: Heart size is within normal limits. Mild prominence of the LEFT hilum. Patchy opacities bilaterally, most prominent at the RIGHT lung base, majority of which have central lucencies suggest cavitary lesions. IMPRESSION: 1. Patchy opacities bilaterally, majority of which have central lucencies suggest cavitary masses/infections. Corresponding cavitary consolidations noted at the lung bases of a CT abdomen performed at the time of today's chest x-ray. Recommend CT chest with contrast for complete evaluation. 2. Questionable prominence of the LEFT hilum, additional cavitary mass/consolidation versus central bronchiectasis. Electronically Signed   By: Bary Richard M.D.   On: 02/08/2018 14:48   Dg Chest 2 View  Result Date: 02/24/2018 CLINICAL DATA:  Short of breath EXAM: CHEST - 2 VIEW COMPARISON:  02/20/2018 FINDINGS: Upper normal heart size. Stable left pleural effusion. Tiny right pleural effusion is stable. Patchy airspace opacities throughout both lungs are not significantly changed. No pneumothorax. IMPRESSION: Bilateral airspace disease and bilateral pleural effusions left greater than right are stable.  Electronically Signed   By: Jolaine Click M.D.   On: 02/24/2018 15:50   Mr Lumbar Spine W Wo Contrast  Result Date: 02/26/2018 CLINICAL DATA:  Low back pain since prior abnormal MRI 02/08/2018. Unable to walk. EXAM: MRI LUMBAR SPINE WITHOUT AND WITH CONTRAST TECHNIQUE: Multiplanar and multiecho pulse sequences of the lumbar spine were obtained without and with intravenous contrast. CONTRAST:  7 mL Gadavist COMPARISON:  02/08/2018 FINDINGS: Segmentation:  Standard. Alignment:  Physiologic. Vertebrae:  No fracture, evidence of discitis, or bone lesion. Conus medullaris and cauda equina: Conus extends to the L1 level. Conus and cauda equina appear normal. Paraspinal and other soft tissues: Posterior paraspinal muscle edema bilaterally with mild enhancement on postcontrast imaging. No intramuscular fluid collection or hematoma. Disc levels: Disc spaces: Disc spaces are maintained. T12-L1: No significant disc bulge. No evidence of neural foraminal stenosis. No central canal stenosis. L1-L2: No significant disc bulge. No evidence of neural foraminal  stenosis. No central canal stenosis. L2-L3: No significant disc bulge. No evidence of neural foraminal stenosis. No central canal stenosis. L3-L4: No significant disc bulge. No evidence of neural foraminal stenosis. No central canal stenosis. L4-L5: Minimal broad-based disc bulge. No evidence of neural foraminal stenosis. No central canal stenosis. L5-S1: Minimal broad-based disc bulge. No evidence of neural foraminal stenosis. No central canal stenosis. IMPRESSION: 1. No epidural fluid collection.  No discitis or osteomyelitis. 2. Posterior paraspinal muscle edema bilaterally with mild enhancement on postcontrast imaging. No intramuscular fluid collection or hematoma. Differential considerations include muscle strain versus mild myositis. Electronically Signed   By: Elige Ko   On: 02/26/2018 09:53   Mr Lumbar Spine W Wo Contrast  Result Date: 02/08/2018 CLINICAL  DATA:  Acute onset of back pain beginning 2 days ago. Study was ordered with contrast but the patient refused. EXAM: MRI LUMBAR SPINE WITHOUT CONTRAST TECHNIQUE: Multiplanar, multisequence MR imaging of the lumbar spine was performed. No intravenous contrast was administered. COMPARISON:  None. FINDINGS: Segmentation:  5 lumbar type vertebral bodies. Alignment:  Normal Vertebrae:  Normal Conus medullaris and cauda equina: Conus extends to the L1 level. Conus and cauda equina appear normal. Paraspinal and other soft tissues: There is lower paraspinous muscle edema as might be seen with a muscular strain. This appears symmetric from right to left. Disc levels: No abnormality at L3-4 or above. L4-5: Very minimal disc bulge.  No stenosis or neural compression. L5-S1: Very minimal disc bulge.  No stenosis or neural compression. IMPRESSION: Minimal disc bulges at L4-5 and L5-S1. No stenosis or neural compression. Paraspinous muscle edema on both sides in the lower lumbar region as might be seen with a muscular strain. This is nonspecific. The differential diagnosis does include infection, but the symmetric nature would be unusual. The patient refused contrast administration. Electronically Signed   By: Paulina Fusi M.D.   On: 02/08/2018 13:56   Dg Chest Port 1 View  Result Date: 02/20/2018 CLINICAL DATA:  Sepsis, endocarditis, cavitary pneumonia with septic emboli and fever. EXAM: PORTABLE CHEST 1 VIEW COMPARISON:  02/08/2018 FINDINGS: Multiple cavitary lesions again identified in both lungs with associated airspace disease in a pattern likely representing septic emboli with multiple areas of cavitary infection. There is a new left lateral pleural effusion which appears likely loculated and may represent an empyema. Further evaluation with CT of the chest with contrast may be helpful. The heart size is stable. IMPRESSION: Persistent appearance of bilateral cavitary pneumonia likely representing septic emboli. There is  a new loculated left lateral pleural effusion that may represent a developing empyema. Further evaluation with CT of the chest with contrast may be helpful. Electronically Signed   By: Irish Lack M.D.   On: 02/20/2018 19:11   [place] Ekg Site Rite  Result Date: 02/26/2018 If Site Rite image not attached, placement could not be confirmed due to current cardiac rhythm.   Catarina Hartshorn, DO  [facility] Pager [phone]  If 7PM-7AM, please contact night-coverage [email or web address] Password [facility] 03/01/2018, 5:31 PM   LOS: 21 days

## 2018-03-01 NOTE — Progress Notes (Signed)
PT Cancellation Note  Patient Details Name: Amy Mcknight MRN: [id number] DOB: 24-Feb-1995   Cancelled Treatment:    Reason Eval/Treat Not Completed: Patient declined, no reason specified.  Patient not responsive to any questions and did not participate with therapy - RN notified.   3:31 PM, 03/01/18 Ocie Bob, MPT Physical Therapist with [facility] [phone] office [phone] mobile phone

## 2018-03-02 LAB — PHOSPHORUS: Phosphorus: 3.8 mg/dL (ref 2.5–4.6)

## 2018-03-02 LAB — CBC WITH DIFFERENTIAL/PLATELET
Abs Immature Granulocytes: 0.17 10*3/uL — ABNORMAL HIGH (ref 0.00–0.07)
Basophils Absolute: 0.1 10*3/uL (ref 0.0–0.1)
Basophils Relative: 1 %
Eosinophils Absolute: 0.2 10*3/uL (ref 0.0–0.5)
Eosinophils Relative: 2 %
HCT: 27.9 % — ABNORMAL LOW (ref 36.0–46.0)
Hemoglobin: 7.9 g/dL — ABNORMAL LOW (ref 12.0–15.0)
Immature Granulocytes: 1 %
Lymphocytes Relative: 30 %
Lymphs Abs: 3.8 10*3/uL (ref 0.7–4.0)
MCH: 25.6 pg — ABNORMAL LOW (ref 26.0–34.0)
MCHC: 28.3 g/dL — ABNORMAL LOW (ref 30.0–36.0)
MCV: 90.6 fL (ref 80.0–100.0)
Monocytes Absolute: 0.9 10*3/uL (ref 0.1–1.0)
Monocytes Relative: 7 %
Neutro Abs: 7.6 10*3/uL (ref 1.7–7.7)
Neutrophils Relative %: 59 %
Platelets: 388 10*3/uL (ref 150–400)
RBC: 3.08 MIL/uL — ABNORMAL LOW (ref 3.87–5.11)
RDW: 18.8 % — ABNORMAL HIGH (ref 11.5–15.5)
WBC: 12.8 10*3/uL — ABNORMAL HIGH (ref 4.0–10.5)
nRBC: 0 % (ref 0.0–0.2)

## 2018-03-02 LAB — COMPREHENSIVE METABOLIC PANEL
ALT: 15 U/L (ref 0–44)
AST: 45 U/L — ABNORMAL HIGH (ref 15–41)
Albumin: 1.6 g/dL — ABNORMAL LOW (ref 3.5–5.0)
Alkaline Phosphatase: 111 U/L (ref 38–126)
Anion gap: 7 (ref 5–15)
BILIRUBIN TOTAL: 0.2 mg/dL — AB (ref 0.3–1.2)
BUN: 8 mg/dL (ref 6–20)
CO2: 26 mmol/L (ref 22–32)
Calcium: 8 mg/dL — ABNORMAL LOW (ref 8.9–10.3)
Chloride: 103 mmol/L (ref 98–111)
Creatinine, Ser: 0.45 mg/dL (ref 0.44–1.00)
GFR calc Af Amer: >60 mL/min (ref >60)
GFR calc non Af Amer: >60 mL/min (ref >60)
Glucose, Bld: 100 mg/dL — ABNORMAL HIGH (ref 70–99)
Potassium: 3.7 mmol/L (ref 3.5–5.1)
Sodium: 136 mmol/L (ref 135–145)
TOTAL PROTEIN: 6.4 g/dL — AB (ref 6.5–8.1)

## 2018-03-02 LAB — TSH: TSH: 3.754 u[IU]/mL (ref 0.350–4.500)

## 2018-03-02 LAB — MAGNESIUM: Magnesium: 1.3 mg/dL — ABNORMAL LOW (ref 1.7–2.4)

## 2018-03-02 LAB — CK: Total CK: 14 U/L — ABNORMAL LOW (ref 38–234)

## 2018-03-02 LAB — T4, FREE: Free T4: 0.94 ng/dL (ref 0.82–1.77)

## 2018-03-02 MED ORDER — MAGNESIUM OXIDE 400 (241.3 MG) MG PO TABS
400.0000 mg | ORAL_TABLET | Freq: Every day | ORAL | Status: DC
  Administered 2018-03-03 – 2018-03-04 (×2): 400 mg via ORAL
  Filled 2018-03-02 (×2): qty 1

## 2018-03-02 MED ORDER — FUROSEMIDE 10 MG/ML IJ SOLN
40.0000 mg | Freq: Once | INTRAMUSCULAR | Status: AC
  Administered 2018-03-02: 40 mg via INTRAVENOUS
  Filled 2018-03-02: qty 4

## 2018-03-02 MED ORDER — BUDESONIDE 0.5 MG/2ML IN SUSP: 0.5000 mg | Freq: Two times a day (BID) | RESPIRATORY_TRACT | Status: DC | PRN

## 2018-03-02 MED ORDER — MAGNESIUM SULFATE 2 GM/50ML IV SOLN
2.0000 g | Freq: Once | INTRAVENOUS | Status: AC
  Administered 2018-03-02: 2 g via INTRAVENOUS
  Filled 2018-03-02: qty 50

## 2018-03-02 NOTE — Progress Notes (Signed)
PROGRESS NOTE  Amy Mcknight ZOX:[id number] DOB: February 01, 1995 DOA: 02/08/2018 PCP: Patient, No Pcp Per  Brief History: 23 y.o.femalewith medical history significant forsubstance use disorderwho presents to the ED with 2 days of right lower back pain. Patient states symptoms began with acute onset while at rest. Symptoms have been persistent. She has noted associated diaphoresis, chills, shortness of breath, cough productive of brown sputum, and right chest wall pain. She denies any rashes or obvious skin changes. She reports good urine output without dysuria. She denies any abdominal pain, diarrhea, or constipation.  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 She continues to use recreational drugs by snorting nasally. She denies any alcohol use. She reports a history of withdrawal from opiates in the past.And is noted to be positive for marijuana and opiates on her UDS.  She is noted to be anemicand is status post 2 unit PRBC transfusion with improvement noted. No overt bleeding currently identified, but patient is complaining of some epigastric abdominal pain. As result of this pain, she has not been eating very much. She denies any nausea or vomiting.GI has evaluated patient with no need for endoscopy noted at this time. she refuses bathing and laboratory draws. She has been using a bedpan and has not been eating very well. She has been more somnolent from time to time possibly due to oversedation  Assessment/Plan: Sepsis with early Septic shock -due toMSSA bacteremia, tricuspid vegetation, paravertebral myositis and cavitary pneumonia -sepsis pathophysiology and septic shock has resolved -Case discussedpreviouslywith infectious disease doctor -continue IV Ancef for total of 6 weeks from date of first negative culture which was 02/12/2018 in a supervised setting ([facility]) -2D echo positive for tricuspid vegetation and moderate  regurgitation. 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 -repeat CXR as pt c/o dyspnea -repeat labs  Pleural Effusions -due to third spacing -pt +18 kg since admission -lasix IV x 1 -am BMP -personally reviewed CXR--increased bilateral pleural effusions and interstitial markings  Hypomagnesemia -replete -start daily mag ox  AKI (acute kidney injury) (HCC) -resolved -serum creatinine peaked 2.61  Substance use disorder/opiate withdrawal syndrome -she appears to be resting comfortably for extended/long periods of time without any evidence of pain, patient has been weaned off Dilaudid, -okay to continuePRN low-dose oxycodone continuemethocarbamol  4 times daily, Cymbalta 30 mg twice daily and gabapentin 300 mg 3 times daily to help blunt withdrawal symptoms, -c/n hydroxyzine 25 mg nightly -UDS on 02/24/2018 shows opiates --- this was done after there was suspicion of possible inhalation/snorting of drugs in the room on the telemetry sitter camera--- patient did receive Dilaudid here in the hospital, cannot rule out concomitant inhalation/snorting of opiates on 02/24/18 -Dilaudid has been discontinued patient only has PRN oxycodone 5 mg at this time -she is out of the window for active withdrawal symptoms  Tobacco Abuse-- -Extensive cessation counseling provided--continue nicotine patch   positive hepatitis C antibody--- -Quantitative viral load test undetectable;-No treatment needed , outpatient follow-up with ID/gastroenterology advised  Generalized weakness and debility--- -continues to intermittently refuse to work with physical therapy,  -patient continues to refuse to participate in ADLs often, wanting to have  staff do more and more for her -complaints of back pain as of 02/25/2018, patient currently has MSSA  bacteremia and back pain--- lumbar MRI from 02/26/2018 without any evidence of epidural abscess or significant inflammation , (prior lumbar MRI from 02/08/2018 was without contrast as patient refused contrast at the time ) -OOB with meals  Severe Anemia -suspecthemolysis in the setting of acute infection-stable -Anemia panelrevealed some iron deficiency and stool occult is negative -2 unit PRBC orderedon 02/14/18--- Hgb remains stable post prior transfusion -Appreciate GI evaluation with no recommendations for endoscopy at this time -Continue p.o. PPI,  --Ferahemegiven during this admission due to iron deficiency  Depression/Anxiety---- -stable -continue cymbalta -may use hydroxyzine for sleep and anxiety -add hydroxyzine bid prn    Disposition Plan: Home in 24 days Family Communication:NoFamily at bedside  Consultants:ID  Code Status: FULL   DVT Prophylaxis: SCDs   Procedures: As Listed in Progress Note Above  Antibiotics: Cefazolin 2/7>>> vanco2/7       Subjective: Pt still a little sob, but breathing better after lasix.  Denies cp, n/v/d, abd pain. No cp or headache  Objective: Vitals:   03/01/18 2030 03/02/18 0500 03/02/18 0620 03/02/18 1511  BP:   116/85 113/82  Pulse:   (!) 105 99  Resp:    20  Temp:   97.7 F (36.5 C)   TempSrc:   Oral   SpO2: 99%  100% 100%  Weight:  73.4 kg    Height:        Intake/Output Summary (Last 24 hours) at 03/02/2018 1710 Last data filed at 03/02/2018 1300 Gross per 24 hour  Intake 480 ml  Output -  Net 480 ml   Weight change: 2.6 kg Exam:   General:  Pt is alert, follows commands appropriately, not in acute distress  HEENT: No icterus, No thrush, No neck mass, [place]/AT  Cardiovascular: RRR, S1/S2, no rubs, no gallops  Respiratory: bilateral rales, R>L, no wheeze  Abdomen: Soft/+BS,  non tender, non distended, no guarding  Extremities: 2+ LE edema, No lymphangitis, No petechiae, No rashes, no synovitis   Data Reviewed: I have personally reviewed following labs and imaging studies Basic Metabolic Panel: Recent Labs  Lab 02/25/18 0643 03/02/18 0606  NA 137 136  K 3.8 3.7  CL 105 103  CO2 25 26  GLUCOSE 100* 100*  BUN 6 8  CREATININE 0.63 0.45  CALCIUM 8.2* 8.0*  MG  --  1.3*  PHOS  --  3.8   Liver Function Tests: Recent Labs  Lab 03/02/18 0606  AST 45*  ALT 15  ALKPHOS 111  BILITOT 0.2*  PROT 6.4*  ALBUMIN 1.6*   No results for input(s): LIPASE, AMYLASE in the last 168 hours. No results for input(s): AMMONIA in the last 168 hours. Coagulation Profile: No results for input(s): INR, PROTIME in the last 168 hours. CBC: Recent Labs  Lab 02/25/18 0643 03/02/18 0606  WBC 11.1* 12.8*  NEUTROABS  --  7.6  HGB 9.8* 7.9*  HCT 33.3* 27.9*  MCV 90.0 90.6  PLT 300 388   Cardiac Enzymes: Recent Labs  Lab 03/02/18 0606  CKTOTAL 14*   BNP: Invalid input(s): POCBNP CBG: No results for input(s): GLUCAP in the last 168 hours. HbA1C: No results for input(s): HGBA1C in the last 72 hours. Urine analysis:    Component Value Date/Time   COLORURINE YELLOW 02/24/2018 1845   APPEARANCEUR HAZY (A) 02/24/2018 1845   LABSPEC 1.020 02/24/2018 1845   PHURINE 5.0 02/24/2018 1845   GLUCOSEU NEGATIVE 02/24/2018 1845   HGBUR MODERATE (A) 02/24/2018 1845   BILIRUBINUR NEGATIVE 02/24/2018 1845  KETONESUR NEGATIVE 02/24/2018 1845   PROTEINUR 30 (A) 02/24/2018 1845   NITRITE NEGATIVE 02/24/2018 1845   LEUKOCYTESUR SMALL (A) 02/24/2018 1845   Sepsis Labs: @LABRCNTIP (procalcitonin:4,lacticidven:4) )No results found for this or any previous visit (from the past 240 hour(s)).   Scheduled Meds: . budesonide (PULMICORT) nebulizer solution  0.5 mg Nebulization BID  . diclofenac  1 patch Transdermal BID  . DULoxetine  30 mg Oral BID  . gabapentin  300 mg Oral  TID  . hydrOXYzine  25 mg Oral QHS  . [START ON 03/03/2018] magnesium oxide  400 mg Oral Daily  . methocarbamol  750 mg Oral QID  . metoprolol tartrate  75 mg Oral BID  . multivitamin with minerals  1 tablet Oral Daily  . nicotine  7 mg Transdermal Daily  . pantoprazole  40 mg Oral BID  . senna-docusate  2 tablet Oral BID  . sodium chloride flush  10-40 mL Intracatheter Q12H  . sodium chloride flush  3 mL Intravenous Q12H   Continuous Infusions: .  ceFAZolin (ANCEF) IV 2 g (03/02/18 1242)  . magnesium sulfate 1 - 4 g bolus IVPB      Procedures/Studies: Ct Abdomen Pelvis Wo Contrast  Result Date: 02/08/2018 CLINICAL DATA:  Abdominal pain and fever EXAM: CT ABDOMEN AND PELVIS WITHOUT CONTRAST TECHNIQUE: Multidetector CT imaging of the abdomen and pelvis was performed following the standard protocol without oral or IV contrast. COMPARISON:  None. FINDINGS: Lower chest: There is airspace consolidation throughout the lung bases with several areas of cavitation, likely developing lung abscesses. Several nodular opacities are noted associated with these areas of cavitary consolidation, likely septic emboli. Hepatobiliary: Liver measures 20.5 cm in length. No focal liver lesions are appreciable on this noncontrast enhanced study. The gallbladder is borderline dilated without wall thickening. There is no biliary duct dilatation. Pancreas: No pancreatic mass or inflammatory focus. Spleen: Spleen measures 14.1 x 10.9 x 5.1 cm with a measured splenic volume of 392 cubic cm. No focal splenic lesions are evident. Adrenals/Urinary Tract: Adrenals appear unremarkable bilaterally. 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 No renal mass evident. No hydronephrosis on either side. There is a suspected developing staghorn calculus on the left with somewhat amorphous calcification involving a lower pole calyx on the left extending into the left renal pelvis, best appreciated on coronal imaging. This developing  staghorn calculus measures 3.7 x 1.0 cm. No ureteral calculi are evident. Urinary bladder is decompressed. No urinary bladder wall thickening is appreciable with essentially empty bladder. Stomach/Bowel: There is fluid throughout most small bowel loops. There is no appreciable bowel wall or mesenteric thickening. No evident bowel obstruction. No free air or portal venous air is evident. Vascular/Lymphatic: There is no abdominal aortic aneurysm. No vascular lesions are evident on this noncontrast enhanced study. Note that the attenuation throughout the vascular structures appear slightly diminished which raises question of anemia. No adenopathy is appreciable in abdomen or pelvis. Reproductive: Uterus is anteverted. No pelvic masses evident. There is a slight degree of free fluid in the cul-de-sac region. Other: Appendix is not well seen. There is no periappendiceal region inflammation on this study. No abscess is seen in the abdomen or pelvis. There is no ascites beyond the slight fluid noted in the cul-de-sac region. Musculoskeletal: There are no blastic or lytic bone lesions. No intramuscular or abdominal wall lesions are appreciable. IMPRESSION: Comment: Note that paucity of fat makes assessment in the abdomen and pelvis somewhat less than optimal. 1. Areas of cavitary pneumonia, likely with  developing lung abscesses, in the right lower lobe. Areas of consolidation elsewhere with probable septic emboli associated. 2. Prominent liver and spleen. No focal liver and splenic lesions evident. 3. There is evidence of a degree of nephrocalcinosis. Amorphous calcification in a portion of the left renal pelvis and an inferior left calyx suggest developing staghorn calculus. No hydronephrosis on either side. No well-defined ureteral calculi. 4. Fluid in most loops of small bowel. Suspect a degree of ileus or enteritis. No bowel obstruction. No abscess evident in the abdomen or pelvis. 5. Small amount of free fluid in the  cul-de-sac may be upper physiologic. Electronically Signed   By: Bretta Bang III M.D.   On: 02/08/2018 14:47   Dg Chest 1 View  Result Date: 02/08/2018 CLINICAL DATA:  Hypotension and tachycardia. Smoking history. Asthma. EXAM: CHEST  1 VIEW COMPARISON:  None. FINDINGS: Heart size is within normal limits. Mild prominence of the LEFT hilum. Patchy opacities bilaterally, most prominent at the RIGHT lung base, majority of which have central lucencies suggest cavitary lesions. IMPRESSION: 1. Patchy opacities bilaterally, majority of which have central lucencies suggest cavitary masses/infections. Corresponding cavitary consolidations noted at the lung bases of a CT abdomen performed at the time of today's chest x-ray. Recommend CT chest with contrast for complete evaluation. 2. Questionable prominence of the LEFT hilum, additional cavitary mass/consolidation versus central bronchiectasis. Electronically Signed   By: Bary Richard M.D.   On: 02/08/2018 14:48   Dg Chest 2 View  Result Date: 02/24/2018 CLINICAL DATA:  Short of breath EXAM: CHEST - 2 VIEW COMPARISON:  02/20/2018 FINDINGS: Upper normal heart size. Stable left pleural effusion. Tiny right pleural effusion is stable. Patchy airspace opacities throughout both lungs are not significantly changed. No pneumothorax. IMPRESSION: Bilateral airspace disease and bilateral pleural effusions left greater than right are stable. Electronically Signed   By: Jolaine Click M.D.   On: 02/24/2018 15:50   Mr Lumbar Spine W Wo Contrast  Result Date: 02/26/2018 CLINICAL DATA:  Low back pain since prior abnormal MRI 02/08/2018. Unable to walk. EXAM: MRI LUMBAR SPINE WITHOUT AND WITH CONTRAST TECHNIQUE: Multiplanar and multiecho pulse sequences of the lumbar spine were obtained without and with intravenous contrast. CONTRAST:  7 mL Gadavist COMPARISON:  02/08/2018 FINDINGS: Segmentation:  Standard. Alignment:  Physiologic. Vertebrae:  No fracture, evidence of  discitis, or bone lesion. Conus medullaris and cauda equina: Conus extends to the L1 level. Conus and cauda equina appear normal. Paraspinal and other soft tissues: Posterior paraspinal muscle edema bilaterally with mild enhancement on postcontrast imaging. No intramuscular fluid collection or hematoma. Disc levels: Disc spaces: Disc spaces are maintained. T12-L1: No significant disc bulge. No evidence of neural foraminal stenosis. No central canal stenosis. L1-L2: No significant disc bulge. No evidence of neural foraminal stenosis. No central canal stenosis. L2-L3: No significant disc bulge. No evidence of neural foraminal stenosis. No central canal stenosis. L3-L4: No significant disc bulge. No evidence of neural foraminal stenosis. No central canal stenosis. L4-L5: Minimal broad-based disc bulge. No evidence of neural foraminal stenosis. No central canal stenosis. L5-S1: Minimal broad-based disc bulge. No evidence of neural foraminal stenosis. No central canal stenosis. IMPRESSION: 1. No epidural fluid collection.  No discitis or osteomyelitis. 2. Posterior paraspinal muscle edema bilaterally with mild enhancement on postcontrast imaging. No intramuscular fluid collection or hematoma. Differential considerations include muscle strain versus mild myositis. Electronically Signed   By: Elige Ko   On: 02/26/2018 09:53   Mr Lumbar Spine W Wo Contrast  Result Date: 02/08/2018 CLINICAL DATA:  Acute onset of back pain beginning 2 days ago. Study was ordered with contrast but the patient refused. EXAM: MRI LUMBAR SPINE WITHOUT CONTRAST TECHNIQUE: Multiplanar, multisequence MR imaging of the lumbar spine was performed. No intravenous contrast was administered. COMPARISON:  None. FINDINGS: Segmentation:  5 lumbar type vertebral bodies. Alignment:  Normal Vertebrae:  Normal Conus medullaris and cauda equina: Conus extends to the L1 level. Conus and cauda equina appear normal. Paraspinal and other soft tissues: There  is lower paraspinous muscle edema as might be seen with a muscular strain. This appears symmetric from right to left. Disc levels: No abnormality at L3-4 or above. L4-5: Very minimal disc bulge.  No stenosis or neural compression. L5-S1: Very minimal disc bulge.  No stenosis or neural compression. IMPRESSION: Minimal disc bulges at L4-5 and L5-S1. No stenosis or neural compression. Paraspinous muscle edema on both sides in the lower lumbar region as might be seen with a muscular strain. This is nonspecific. The differential diagnosis does include infection, but the symmetric nature would be unusual. The patient refused contrast administration. Electronically Signed   By: Paulina Fusi M.D.   On: 02/08/2018 13:56   Dg Chest Port 1 View  Result Date: 03/01/2018 CLINICAL DATA:  Shortness of breath EXAM: PORTABLE CHEST 1 VIEW COMPARISON:  02/24/2018 FINDINGS: Cardiomegaly. Moderate to large bilateral pleural effusions, stable on the left, increasing on the right since prior study. Diffuse bilateral airspace disease has worsened since prior study and slightly more pronounced on the right. This could reflect asymmetric edema or pneumonia. IMPRESSION: Moderate to large bilateral pleural effusions, increasing on the right since prior study. Diffuse bilateral airspace disease, right greater than left, worsening since prior study. This could reflect asymmetric edema or pneumonia. Electronically Signed   By: Charlett Nose M.D.   On: 03/01/2018 18:14   Dg Chest Port 1 View  Result Date: 02/20/2018 CLINICAL DATA:  Sepsis, endocarditis, cavitary pneumonia with septic emboli and fever. EXAM: PORTABLE CHEST 1 VIEW COMPARISON:  02/08/2018 FINDINGS: Multiple cavitary lesions again identified in both lungs with associated airspace disease in a pattern likely representing septic emboli with multiple areas of cavitary infection. There is a new left lateral pleural effusion which appears likely loculated and may represent an  empyema. Further evaluation with CT of the chest with contrast may be helpful. The heart size is stable. IMPRESSION: Persistent appearance of bilateral cavitary pneumonia likely representing septic emboli. There is a new loculated left lateral pleural effusion that may represent a developing empyema. Further evaluation with CT of the chest with contrast may be helpful. Electronically Signed   By: Irish Lack M.D.   On: 02/20/2018 19:11   [place] Ekg Site Rite  Result Date: 02/26/2018 If Site Rite image not attached, placement could not be confirmed due to current cardiac rhythm.   Catarina Hartshorn, DO  [facility] Pager [phone]  If 7PM-7AM, please contact night-coverage [email or web address] Password [email or web address] 03/02/2018, 5:10 PM   LOS: 22 days

## 2018-03-02 NOTE — Progress Notes (Signed)
Nutrition Follow up  DOCUMENTATION CODES:   Severe malnutrition in context of acute illness/injury  INTERVENTION:  Regular diet /snack between meals    Magic Cup BID  MVI daily  NUTRITION DIAGNOSIS:   Severe Malnutrition related to acute illness(MSSA bacteremia, Pneumonia, endocarditis.) as evidenced by energy intake < or equal to 50% for > or equal to 5 days, 17% wt gain (BLE-(moderate to severe) edema).    GOAL:  Patient will meet greater than or equal to 90% of their needs; progressing with improved appetite and meal intake  MONITOR:   PO intake, Weight trends, Labs, I & O's  ASSESSMENT: Patient is a 23 yo female with a history of substance abuse, severe anemia (received 2 units PRBC's), MSSA bacteremia, Pneumonia, endocarditis. Bi-lateral pedal edema. Severe weight gain since admission-17% increase in wt or gain of 9.9 kg.   Supplements: none- patient refuses offer of any supplements or milk with meals- replies, "I don't have a taste for anything." Explained to patient that her body requires nutrition whether she "feels like" eating or not. RD unable to identify any food or beverage that pt would commit to eat or drink.  F/U- 2/24 patient consumed 100% of breakfast this morning and on RD arrival she is waiting on nutrition services to bring a snack of cereal. Patient says she is eating better and her demeanor is better today. Lower extremity edema looks better and she is wearing her ted hose. Labs reviewed. Current weight 68 kg which is a gain of 13.6 kg (20%).   F/U-2/28 patient ate 75% of breakfast this morning. Her weight continues to increase - an additional 5 kg since 2/24 and a total of 18 kg since admission. MD notified. No changes to nutrition plan at this point.  Labs: BMP Latest Ref Rng & Units 03/02/2018 02/25/2018 02/22/2018  Glucose 70 - 99 mg/dL 496(P) 591(M) 384(Y)  BUN 6 - 20 mg/dL 8 6 5(L)  Creatinine 6.59 - 1.00 mg/dL 9.35 7.01 7.79  Sodium 135 - 145 mmol/L  136 137 134(L)  Potassium 3.5 - 5.1 mmol/L 3.7 3.8 4.1  Chloride 98 - 111 mmol/L 103 105 102  CO2 22 - 32 mmol/L 26 25 23   Calcium 8.9 - 10.3 mg/dL 8.0(L) 8.2(L) 7.6(L)     Diet Order:   Diet Order            Diet regular Room service appropriate? Yes; Fluid consistency: Thin  Diet effective now              EDUCATION NEEDS:   Not appropriate for education at this time   Skin:  Skin Assessment: Reviewed RN Assessment  Last BM:  2/27 large BM  Height:   Ht Readings from Last 1 Encounters:  02/08/18 5\' 3"  (1.6 m)    Weight:   Wt Readings from Last 1 Encounters:  03/02/18 73.4 kg    Ideal Body Weight:  52 kg  BMI:  Body mass index is 28.66 kg/m.  Estimated Nutritional Needs:   Kcal:  1650-1815 (30-33 kcal/kg/bw)(Based on admission wt of 55 kg. Severe wt gain (20 lb) since admission.)  Protein:  88-99 (1.6-1.8 gr/kg/bw)  Fluid:  per MD goals   Royann Shivers MS,RD,CSG,LDN Office: [phone] Pager: [phone]

## 2018-03-03 LAB — BASIC METABOLIC PANEL
Anion gap: 6 (ref 5–15)
BUN: 7 mg/dL (ref 6–20)
CO2: 27 mmol/L (ref 22–32)
Calcium: 8 mg/dL — ABNORMAL LOW (ref 8.9–10.3)
Chloride: 102 mmol/L (ref 98–111)
Creatinine, Ser: 0.42 mg/dL — ABNORMAL LOW (ref 0.44–1.00)
GFR calc Af Amer: >60 mL/min (ref >60)
GFR calc non Af Amer: >60 mL/min (ref >60)
Glucose, Bld: 94 mg/dL (ref 70–99)
Potassium: 3.6 mmol/L (ref 3.5–5.1)
Sodium: 135 mmol/L (ref 135–145)

## 2018-03-03 LAB — MAGNESIUM: Magnesium: 1.6 mg/dL — ABNORMAL LOW (ref 1.7–2.4)

## 2018-03-03 LAB — BRAIN NATRIURETIC PEPTIDE: B Natriuretic Peptide: 614 pg/mL — ABNORMAL HIGH (ref 0.0–100.0)

## 2018-03-03 MED ORDER — FUROSEMIDE 10 MG/ML IJ SOLN
40.0000 mg | Freq: Once | INTRAMUSCULAR | Status: AC
  Administered 2018-03-03: 40 mg via INTRAVENOUS
  Filled 2018-03-03: qty 4

## 2018-03-03 MED ORDER — FUROSEMIDE 10 MG/ML IJ SOLN
40.0000 mg | Freq: Every day | INTRAMUSCULAR | Status: DC
  Administered 2018-03-04 – 2018-03-05 (×2): 40 mg via INTRAVENOUS
  Filled 2018-03-03 (×2): qty 4

## 2018-03-03 NOTE — Progress Notes (Signed)
PROGRESS NOTE  Amy BattlesShan Mcknight ZOX:[id number] DOB: March 03, 1995 DOA: 02/08/2018 PCP: Patient, No Pcp Per  Brief History: 23 y.o.femalewith medical history significant forsubstance use disorderwho presents to the ED with 2 days of right lower back pain. Patient states symptoms began with acute onset while at rest. Symptoms have been persistent. She has noted associated diaphoresis, chills, shortness of breath, cough productive of brown sputum, and right chest wall pain. She denies any rashes or obvious skin changes. She reports good urine output without dysuria. She denies any abdominal pain, diarrhea, or constipation.  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 She continues to use recreational drugs by snorting nasally. She denies any alcohol use. She reports a history of withdrawal from opiates in the past.And is noted to be positive for marijuana and opiates on her UDS.  She is noted to be anemicand is status post 2 unit PRBC transfusion with improvement noted. No overt bleeding currently identified, but patient is complaining of some epigastric abdominal pain. As result of this pain, she has not been eating very much. She denies any nausea or vomiting.GI has evaluated patient with no need for endoscopy noted at this time. she refuses bathing and laboratory draws. She has been using a bedpan and has not been eating very well. She has been more somnolent from time to time possibly due to oversedation  Assessment/Plan: Sepsis with early Septic shock -due toMSSA bacteremia, tricuspid vegetation, paravertebral myositis and cavitary pneumonia -sepsis pathophysiology and septic shock has resolved -Case discussedpreviouslywith infectious disease doctor -continue IV Ancef for total of 6 weeks from date of first negative culture which was 02/12/2018 in a supervised setting ([facility]) -2D echo positive for tricuspid vegetation and moderate  regurgitation. 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 -remains afebrile and hemodynamically stable  Pleural Effusions -due to third spacing -pt +18 kg since admission -start lasix 40 mg IV daily -am BMP -personally reviewed CXR--increased bilateral pleural effusions and interstitial markings -repeat limited echo  Hypomagnesemia -replete -start daily mag ox  AKI (acute kidney injury) (HCC) -resolved -serum creatinine peaked 2.61  Substance use disorder/opiate withdrawal syndrome -she appears to be resting comfortably for extended/long periods of time without any evidence of pain, patient has been weaned off Dilaudid, -okay to continuePRN low-dose oxycodone continuemethocarbamol 750mg  4 times daily, Cymbalta 30 mg twice daily and gabapentin 300 mg 3 times daily to help blunt withdrawal symptoms, -c/n hydroxyzine 25 mg nightly -UDS on 02/24/2018 shows opiates --- this was done after there was suspicion of possible inhalation/snorting of drugs in the room on the telemetry sitter camera--- patient did receive Dilaudid here in the hospital, cannot rule out concomitant inhalation/snorting of opiates on 02/24/18 -Dilaudid has been discontinued patient only has PRN oxycodone 5 mg at this time -she is out of the window for active withdrawal symptoms  Tobacco Abuse-- -Extensive cessation counseling provided--continue nicotine patch   positive hepatitis C antibody--- -Quantitative viral load test undetectable;-No treatment needed  Generalized weakness and debility--- -continues tointermittently refuseto work with physical therapy,  -patient continues to refuse to participate in ADLs often, wanting to have staff do more and more for  her -complaints of back pain as of 02/25/2018, patient currently has MSSA bacteremia and back pain--- lumbar MRI from 02/26/2018 without  any evidence of epidural abscess or significant inflammation , (prior lumbar MRI from 02/08/2018 was without contrast as patient refused contrast at the time ) -OOB with meals  Severe Anemia -suspecthemolysis in the setting of acute infection-stable -Anemia panelrevealed some iron deficiency and stool occult is negative -2 unit PRBC orderedon 02/14/18--- Hgb remains stable post prior transfusion -Appreciate GI evaluation with no recommendations for endoscopy at this time -Continue p.o. PPI, --Ferahemegiven during this admission due to iron deficiency  Depression/Anxiety---- -stable -continue cymbalta -may use hydroxyzine for sleep and anxiety -add hydroxyzine bid prn    Disposition Plan: Home in 23 days Family Communication:NoFamily at bedside  Consultants:ID  Code Status: FULL   DVT Prophylaxis: SCDs   Procedures: As Listed in Progress Note Above  Antibiotics: Cefazolin 2/7>>> vanco2/7   Subjective: Pt feels that she is breathing better.  Denies cp, vomiting, diarrhea.  Had diarrhea.  Eating fair to good.  Denies f/c, cp, abd pain.  Objective: Vitals:   03/02/18 1511 03/02/18 1939 03/02/18 2211 03/03/18 0230  BP: 113/82  (!) 136/104   Pulse: 99 (!) 110 (!) 121 (!) 103  Resp: 20 20 20    Temp:   98.8 F (37.1 C)   TempSrc:   Oral   SpO2: 100% 98% 100%   Weight:      Height:        Intake/Output Summary (Last 24 hours) at 03/03/2018 1642 Last data filed at 03/03/2018 0900 Gross per 24 hour  Intake 2039.17 ml  Output -  Net 2039.17 ml   Weight change:  Exam:   General:  Pt is alert, follows commands appropriately, not in acute distress  HEENT: No icterus, No thrush, No neck mass, [place]/AT  Cardiovascular: RRR, S1/S2,S3  Respiratory: bibasilar crackles. No wheeze  Abdomen: Soft/+BS, non  tender, non distended, no guarding  Extremities: 2 + LE edema, No lymphangitis, No petechiae, No rashes, no synovitis   Data Reviewed: I have personally reviewed following labs and imaging studies Basic Metabolic Panel: Recent Labs  Lab 02/25/18 0643 03/02/18 0606 03/03/18 0716  NA 137 136 135  K 3.8 3.7 3.6  CL 105 103 102  CO2 25 26 27   GLUCOSE 100* 100* 94  BUN 6 8 7   CREATININE 0.63 0.45 0.42*  CALCIUM 8.2* 8.0* 8.0*  MG  --  1.3* 1.6*  PHOS  --  3.8  --    Liver Function Tests: Recent Labs  Lab 03/02/18 0606  AST 45*  ALT 15  ALKPHOS 111  BILITOT 0.2*  PROT 6.4*  ALBUMIN 1.6*   No results for input(s): LIPASE, AMYLASE in the last 168 hours. No results for input(s): AMMONIA in the last 168 hours. Coagulation Profile: No results for input(s): INR, PROTIME in the last 168 hours. CBC: Recent Labs  Lab 02/25/18 0643 03/02/18 0606  WBC 11.1* 12.8*  NEUTROABS  --  7.6  HGB 9.8* 7.9*  HCT 33.3* 27.9*  MCV 90.0 90.6  PLT 300 388   Cardiac Enzymes: Recent Labs  Lab 03/02/18 0606  CKTOTAL 14*   BNP: Invalid input(s): POCBNP CBG: No results for input(s): GLUCAP in the last 168 hours. HbA1C: No results for input(s): HGBA1C in the last 72 hours. Urine analysis:    Component Value Date/Time   COLORURINE YELLOW 02/24/2018 1845   APPEARANCEUR HAZY (A) 02/24/2018 1845   LABSPEC 1.020 02/24/2018 1845   PHURINE 5.0 02/24/2018 1845   GLUCOSEU NEGATIVE 02/24/2018 1845   HGBUR MODERATE (A) 02/24/2018 1845   BILIRUBINUR  NEGATIVE 02/24/2018 1845   KETONESUR NEGATIVE 02/24/2018 1845   PROTEINUR 30 (A) 02/24/2018 1845   NITRITE NEGATIVE 02/24/2018 1845   LEUKOCYTESUR SMALL (A) 02/24/2018 1845   Sepsis Labs: (procalcitonin:4,lacticidven:4) )No results found for this or any previous visit (from the past 240 hour(s)).   Scheduled Meds: . diclofenac  1 patch Transdermal BID  . DULoxetine  30 mg Oral BID  . gabapentin  300 mg Oral TID  .  hydrOXYzine  25 mg Oral QHS  . magnesium oxide  400 mg Oral Daily  . methocarbamol  750 mg Oral QID  . metoprolol tartrate  75 mg Oral BID  . multivitamin with minerals  1 tablet Oral Daily  . pantoprazole  40 mg Oral BID  . senna-docusate  2 tablet Oral BID  . sodium chloride flush  10-40 mL Intracatheter Q12H  . sodium chloride flush  3 mL Intravenous Q12H   Continuous Infusions: .  ceFAZolin (ANCEF) IV 2 g (03/03/18 1339)    Procedures/Studies: Ct Abdomen Pelvis Wo Contrast  Result Date: 02/08/2018 CLINICAL DATA:  Abdominal pain and fever EXAM: CT ABDOMEN AND PELVIS WITHOUT CONTRAST TECHNIQUE: Multidetector CT imaging of the abdomen and pelvis was performed following the standard protocol without oral or IV contrast. COMPARISON:  None. FINDINGS: Lower chest: There is airspace consolidation throughout the lung bases with several areas of cavitation, likely developing lung abscesses. Several nodular opacities are noted associated with these areas of cavitary consolidation, likely septic emboli. Hepatobiliary: Liver measures 20.5 cm in length. No focal liver lesions are appreciable on this noncontrast enhanced study. The gallbladder is borderline dilated without wall thickening. There is no biliary duct dilatation. Pancreas: No pancreatic mass or inflammatory focus. Spleen: Spleen measures 14.1 x 10.9 x 5.1 cm with a measured splenic volume of 392 cubic cm. No focal splenic lesions are evident. Adrenals/Urinary Tract: Adrenals appear unremarkable bilaterally. 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 No renal mass evident. No hydronephrosis on either side. There is a suspected developing staghorn calculus on the left with somewhat amorphous calcification involving a lower pole calyx on the left extending into the left renal pelvis, best appreciated on coronal imaging. This developing staghorn calculus measures 3.7 x 1.0 cm. No ureteral calculi are evident. Urinary bladder is decompressed. No  urinary bladder wall thickening is appreciable with essentially empty bladder. Stomach/Bowel: There is fluid throughout most small bowel loops. There is no appreciable bowel wall or mesenteric thickening. No evident bowel obstruction. No free air or portal venous air is evident. Vascular/Lymphatic: There is no abdominal aortic aneurysm. No vascular lesions are evident on this noncontrast enhanced study. Note that the attenuation throughout the vascular structures appear slightly diminished which raises question of anemia. No adenopathy is appreciable in abdomen or pelvis. Reproductive: Uterus is anteverted. No pelvic masses evident. There is a slight degree of free fluid in the cul-de-sac region. Other: Appendix is not well seen. There is no periappendiceal region inflammation on this study. No abscess is seen in the abdomen or pelvis. There is no ascites beyond the slight fluid noted in the cul-de-sac region. Musculoskeletal: There are no blastic or lytic bone lesions. No intramuscular or abdominal wall lesions are appreciable. IMPRESSION: Comment: Note that paucity of fat makes assessment in the abdomen and pelvis somewhat less than optimal. 1. Areas of cavitary pneumonia, likely with developing lung abscesses, in the right lower lobe. Areas of consolidation elsewhere with probable septic emboli associated. 2. Prominent liver and spleen. No focal liver and splenic lesions evident.  3. There is evidence of a degree of nephrocalcinosis. Amorphous calcification in a portion of the left renal pelvis and an inferior left calyx suggest developing staghorn calculus. No hydronephrosis on either side. No well-defined ureteral calculi. 4. Fluid in most loops of small bowel. Suspect a degree of ileus or enteritis. No bowel obstruction. No abscess evident in the abdomen or pelvis. 5. Small amount of free fluid in the cul-de-sac may be upper physiologic. Electronically Signed   By: Bretta Bang III M.D.   On: 02/08/2018  14:47   Dg Chest 1 View  Result Date: 02/08/2018 CLINICAL DATA:  Hypotension and tachycardia. Smoking history. Asthma. EXAM: CHEST  1 VIEW COMPARISON:  None. FINDINGS: Heart size is within normal limits. Mild prominence of the LEFT hilum. Patchy opacities bilaterally, most prominent at the RIGHT lung base, majority of which have central lucencies suggest cavitary lesions. IMPRESSION: 1. Patchy opacities bilaterally, majority of which have central lucencies suggest cavitary masses/infections. Corresponding cavitary consolidations noted at the lung bases of a CT abdomen performed at the time of today's chest x-ray. Recommend CT chest with contrast for complete evaluation. 2. Questionable prominence of the LEFT hilum, additional cavitary mass/consolidation versus central bronchiectasis. Electronically Signed   By: Bary Richard M.D.   On: 02/08/2018 14:48   Dg Chest 2 View  Result Date: 02/24/2018 CLINICAL DATA:  Short of breath EXAM: CHEST - 2 VIEW COMPARISON:  02/20/2018 FINDINGS: Upper normal heart size. Stable left pleural effusion. Tiny right pleural effusion is stable. Patchy airspace opacities throughout both lungs are not significantly changed. No pneumothorax. IMPRESSION: Bilateral airspace disease and bilateral pleural effusions left greater than right are stable. Electronically Signed   By: Jolaine Click M.D.   On: 02/24/2018 15:50   Mr Lumbar Spine W Wo Contrast  Result Date: 02/26/2018 CLINICAL DATA:  Low back pain since prior abnormal MRI 02/08/2018. Unable to walk. EXAM: MRI LUMBAR SPINE WITHOUT AND WITH CONTRAST TECHNIQUE: Multiplanar and multiecho pulse sequences of the lumbar spine were obtained without and with intravenous contrast. CONTRAST:  7 mL Gadavist COMPARISON:  02/08/2018 FINDINGS: Segmentation:  Standard. Alignment:  Physiologic. Vertebrae:  No fracture, evidence of discitis, or bone lesion. Conus medullaris and cauda equina: Conus extends to the L1 level. Conus and cauda equina  appear normal. Paraspinal and other soft tissues: Posterior paraspinal muscle edema bilaterally with mild enhancement on postcontrast imaging. No intramuscular fluid collection or hematoma. Disc levels: Disc spaces: Disc spaces are maintained. T12-L1: No significant disc bulge. No evidence of neural foraminal stenosis. No central canal stenosis. L1-L2: No significant disc bulge. No evidence of neural foraminal stenosis. No central canal stenosis. L2-L3: No significant disc bulge. No evidence of neural foraminal stenosis. No central canal stenosis. L3-L4: No significant disc bulge. No evidence of neural foraminal stenosis. No central canal stenosis. L4-L5: Minimal broad-based disc bulge. No evidence of neural foraminal stenosis. No central canal stenosis. L5-S1: Minimal broad-based disc bulge. No evidence of neural foraminal stenosis. No central canal stenosis. IMPRESSION: 1. No epidural fluid collection.  No discitis or osteomyelitis. 2. Posterior paraspinal muscle edema bilaterally with mild enhancement on postcontrast imaging. No intramuscular fluid collection or hematoma. Differential considerations include muscle strain versus mild myositis. Electronically Signed   By: Elige Ko   On: 02/26/2018 09:53   Mr Lumbar Spine W Wo Contrast  Result Date: 02/08/2018 CLINICAL DATA:  Acute onset of back pain beginning 2 days ago. Study was ordered with contrast but the patient refused. EXAM: MRI LUMBAR SPINE  WITHOUT CONTRAST TECHNIQUE: Multiplanar, multisequence MR imaging of the lumbar spine was performed. No intravenous contrast was administered. COMPARISON:  None. FINDINGS: Segmentation:  5 lumbar type vertebral bodies. Alignment:  Normal Vertebrae:  Normal Conus medullaris and cauda equina: Conus extends to the L1 level. Conus and cauda equina appear normal. Paraspinal and other soft tissues: There is lower paraspinous muscle edema as might be seen with a muscular strain. This appears symmetric from right to  left. Disc levels: No abnormality at L3-4 or above. L4-5: Very minimal disc bulge.  No stenosis or neural compression. L5-S1: Very minimal disc bulge.  No stenosis or neural compression. IMPRESSION: Minimal disc bulges at L4-5 and L5-S1. No stenosis or neural compression. Paraspinous muscle edema on both sides in the lower lumbar region as might be seen with a muscular strain. This is nonspecific. The differential diagnosis does include infection, but the symmetric nature would be unusual. The patient refused contrast administration. Electronically Signed   By: Paulina Fusi M.D.   On: 02/08/2018 13:56   Dg Chest Port 1 View  Result Date: 03/01/2018 CLINICAL DATA:  Shortness of breath EXAM: PORTABLE CHEST 1 VIEW COMPARISON:  02/24/2018 FINDINGS: Cardiomegaly. Moderate to large bilateral pleural effusions, stable on the left, increasing on the right since prior study. Diffuse bilateral airspace disease has worsened since prior study and slightly more pronounced on the right. This could reflect asymmetric edema or pneumonia. IMPRESSION: Moderate to large bilateral pleural effusions, increasing on the right since prior study. Diffuse bilateral airspace disease, right greater than left, worsening since prior study. This could reflect asymmetric edema or pneumonia. Electronically Signed   By: Charlett Nose M.D.   On: 03/01/2018 18:14   Dg Chest Port 1 View  Result Date: 02/20/2018 CLINICAL DATA:  Sepsis, endocarditis, cavitary pneumonia with septic emboli and fever. EXAM: PORTABLE CHEST 1 VIEW COMPARISON:  02/08/2018 FINDINGS: Multiple cavitary lesions again identified in both lungs with associated airspace disease in a pattern likely representing septic emboli with multiple areas of cavitary infection. There is a new left lateral pleural effusion which appears likely loculated and may represent an empyema. Further evaluation with CT of the chest with contrast may be helpful. The heart size is stable. IMPRESSION:  Persistent appearance of bilateral cavitary pneumonia likely representing septic emboli. There is a new loculated left lateral pleural effusion that may represent a developing empyema. Further evaluation with CT of the chest with contrast may be helpful. Electronically Signed   By: Irish Lack M.D.   On: 02/20/2018 19:11   [place] Ekg Site Rite  Result Date: 02/26/2018 If Site Rite image not attached, placement could not be confirmed due to current cardiac rhythm.   Catarina Hartshorn, DO  [facility] Pager [phone]  If 7PM-7AM, please contact night-coverage [email or web address] Password [email or web address] 03/03/2018, 4:42 PM   LOS: 23 days

## 2018-03-04 LAB — CBC
HCT: 26.2 % — ABNORMAL LOW (ref 36.0–46.0)
Hemoglobin: 7.6 g/dL — ABNORMAL LOW (ref 12.0–15.0)
MCH: 26.4 pg (ref 26.0–34.0)
MCHC: 29 g/dL — ABNORMAL LOW (ref 30.0–36.0)
MCV: 91 fL (ref 80.0–100.0)
Platelets: 352 10*3/uL (ref 150–400)
RBC: 2.88 MIL/uL — ABNORMAL LOW (ref 3.87–5.11)
RDW: 19.1 % — ABNORMAL HIGH (ref 11.5–15.5)
WBC: 12.2 10*3/uL — ABNORMAL HIGH (ref 4.0–10.5)
nRBC: 0.3 % — ABNORMAL HIGH (ref 0.0–0.2)

## 2018-03-04 LAB — BASIC METABOLIC PANEL
Anion gap: 8 (ref 5–15)
BUN: 8 mg/dL (ref 6–20)
CALCIUM: 8 mg/dL — AB (ref 8.9–10.3)
CO2: 27 mmol/L (ref 22–32)
Chloride: 102 mmol/L (ref 98–111)
Creatinine, Ser: 0.49 mg/dL (ref 0.44–1.00)
GFR calc Af Amer: >60 mL/min (ref >60)
GFR calc non Af Amer: >60 mL/min (ref >60)
Glucose, Bld: 92 mg/dL (ref 70–99)
Potassium: 3.4 mmol/L — ABNORMAL LOW (ref 3.5–5.1)
Sodium: 137 mmol/L (ref 135–145)

## 2018-03-04 LAB — MAGNESIUM: Magnesium: 1.4 mg/dL — ABNORMAL LOW (ref 1.7–2.4)

## 2018-03-04 MED ORDER — MAGNESIUM OXIDE 400 (241.3 MG) MG PO TABS
400.0000 mg | ORAL_TABLET | Freq: Two times a day (BID) | ORAL | Status: DC
  Administered 2018-03-04 – 2018-03-26 (×41): 400 mg via ORAL
  Filled 2018-03-04 (×44): qty 1

## 2018-03-04 MED ORDER — MAGNESIUM SULFATE 2 GM/50ML IV SOLN
2.0000 g | Freq: Once | INTRAVENOUS | Status: AC
  Administered 2018-03-04: 2 g via INTRAVENOUS
  Filled 2018-03-04: qty 50

## 2018-03-04 MED ORDER — POTASSIUM CHLORIDE CRYS ER 20 MEQ PO TBCR
20.0000 meq | EXTENDED_RELEASE_TABLET | Freq: Every day | ORAL | Status: DC
  Administered 2018-03-04 – 2018-03-12 (×6): 20 meq via ORAL
  Filled 2018-03-04 (×12): qty 1

## 2018-03-04 MED ORDER — SODIUM CHLORIDE 0.9 % IV SOLN
125.0000 mg | Freq: Once | INTRAVENOUS | Status: AC
  Administered 2018-03-04: 125 mg via INTRAVENOUS
  Filled 2018-03-04: qty 10

## 2018-03-04 NOTE — Progress Notes (Signed)
PROGRESS NOTE  Amy Mcknight NFA:[id number] DOB: 1995/07/27 DOA: 02/08/2018 PCP: Patient, No Pcp Per  Brief History: 23 y.o.femalewith medical history significant forsubstance use disorderwho presents to the ED with 2 days of right lower back pain. Patient states symptoms began with acute onset while at rest. Symptoms have been persistent. She has noted associated diaphoresis, chills, shortness of breath, cough productive of brown sputum, and right chest wall pain. She denies any rashes or obvious skin changes. She reports good urine output without dysuria. She denies any abdominal pain, diarrhea, or constipation.  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 She continues to use recreational drugs by snorting nasally. She denies any alcohol use. She reports a history of withdrawal from opiates in the past.And is noted to be positive for marijuana and opiates on her UDS.  She is noted to be anemicand is status post 2 unit PRBC transfusion with improvement noted. No overt bleeding currently identified, but patient is complaining of some epigastric abdominal pain. As result of this pain, she has not been eating very much. She denies any nausea or vomiting.GI has evaluated patient with no need for endoscopy noted at this time. she refuses bathing and laboratory draws. She has been using a bedpan and has not been eating very well. She has been more somnolent from time to time possibly due to oversedation  Assessment/Plan: Sepsis with early Septic shock -due toMSSA bacteremia, tricuspid vegetation, paravertebral myositis and cavitary pneumonia -sepsis pathophysiology and septic shock has resolved -Case discussedpreviouslywith infectious disease doctor -continue IV Ancef for total of 6 weeks from date of first negative culture which was 02/12/2018 in a supervised setting ([facility]) -2D echo positive for tricuspid vegetation and moderate  regurgitation. 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 -remains afebrile and hemodynamically stable  Pleural Effusions -due to third spacing -pt +18 kg since admission -continue lasix 40 mg IV daily -am BMP -personally reviewed CXR--increased bilateral pleural effusions and interstitial markings -repeat limited echo  Hypomagnesemia -replete -start daily mag ox--increase to bid  Hypokalemia -replete  AKI (acute kidney injury) (HCC) -resolved -serum creatinine peaked 2.61  Substance use disorder/opiate withdrawal syndrome -she appears to be resting comfortably for extended/long periods of time without any evidence of pain, patient has been weaned off Dilaudid, -okay to continuePRN low-dose oxycodone continuemethocarbamol  4 times daily, Cymbalta 30 mg twice daily and gabapentin 300 mg 3 times daily to help blunt withdrawal symptoms, -c/n hydroxyzine 25 mg nightly -UDS on 02/24/2018 shows opiates --- this was done after there was suspicion of possible inhalation/snorting of drugs in the room on the telemetry sitter camera--- patient did receive Dilaudid here in the hospital, cannot rule out concomitant inhalation/snorting of opiates on 02/24/18 -Dilaudid has been discontinued patient only has PRN oxycodone 5 mg at this time -she is out of the window for active withdrawal symptoms  Tobacco Abuse-- -Extensive cessation counseling provided--continue nicotine patch   positive hepatitis C antibody--- -Quantitative viral load test undetectable;-No treatment needed  Generalized weakness and debility--- -continues tointermittently refuseto work with physical therapy,  -patient continues to  refuse to participate in ADLs often, wanting to have staff do more and more for her -complaints of back pain as of 02/25/2018, patient currently has  MSSA bacteremia and back pain--- lumbar MRI from 02/26/2018 without any evidence of epidural abscess or significant inflammation , (prior lumbar MRI from 02/08/2018 was without contrast as patient refused contrast at the time ) -OOB with meals  Anemia of chronic disease -suspecthemolysis in the setting of acute infection-stable -Anemia panelrevealed some iron deficiency and stool occult is negative -2 unit PRBC orderedon 02/14/18--- Hgb remains stable post prior transfusion -Appreciate GI evaluation with no recommendations for endoscopy at this time -Continue p.o. PPI, --Ferahemegiven during this admission  -give dose ferrelicit  Depression/Anxiety---- -stable -continue cymbalta -may use hydroxyzine for sleep and anxiety -add hydroxyzine bid prn    Disposition Plan: Home on 03/26/18 if stable Family Communication:NoFamily at bedside  Consultants:ID  Code Status: FULL   DVT Prophylaxis: SCDs   Procedures: As Listed in Progress Note Above  Antibiotics: Cefazolin 2/7>>> vanco2/7  Subjective: Pt states she is breathing better.  Denies cp, n/v/d, abd pain.  Eating ok.  C/o back pain about same as last week  Objective: Vitals:   03/03/18 2121 03/04/18 0551 03/04/18 0553 03/04/18 1341  BP: (!) 133/102 (!) 128/105  (!) 143/104  Pulse: (!) 111 (!) 101  (!) 108  Resp: Temp: 98.2 F (36.8 C) 97.9 F (36.6 C)  98.2 F (36.8 C)  TempSrc: Oral Oral    SpO2: 100% 100%  99%  Weight:   73.1 kg   Height:    (1.6 m)     Intake/Output Summary (Last 24 hours) at 03/04/2018 1426 Last data filed at 03/04/2018 1300 Gross per 24 hour  Intake 603 ml  Output -  Net 603 ml   Weight change:  Exam:   General:  Pt is alert, follows commands appropriately, not in acute distress  HEENT: No icterus,  No thrush, No neck mass, [place]/AT  Cardiovascular: RRR, S1/S2, no rubs, no gallops  Respiratory:bibasilar crackles, no wheeze  Abdomen: Soft/+BS, non tender, non distended, no guarding  Extremities: 1+LE edema, No lymphangitis, No petechiae, No rashes, no synovitis   Data Reviewed: I have personally reviewed following labs and imaging studies Basic Metabolic Panel: Recent Labs  Lab 03/02/18 0606 03/03/18 0716 03/04/18 0645  NA 136 135 137  K 3.7 3.6 3.4*  CL 103 102 102  CO2 GLUCOSE 100* 94 92  BUN CREATININE 0.45 0.42* 0.49  CALCIUM 8.0* 8.0* 8.0*  MG 1.3* 1.6* 1.4*  PHOS 3.8  --   --    Liver Function Tests: Recent Labs  Lab 03/02/18 0606  AST 45*  ALT 15  ALKPHOS 111  BILITOT 0.2*  PROT 6.4*  ALBUMIN 1.6*   No results for input(s): LIPASE, AMYLASE in the last 168 hours. No results for input(s): AMMONIA in the last 168 hours. Coagulation Profile: No results for input(s): INR, PROTIME in the last 168 hours. CBC: Recent Labs  Lab 03/02/18 0606 03/04/18 0645  WBC 12.8* 12.2*  NEUTROABS 7.6  --   HGB 7.9* 7.6*  HCT 27.9* 26.2*  MCV 90.6 91.0  PLT 388 352   Cardiac Enzymes: Recent Labs  Lab 03/02/18 0606  CKTOTAL 14*   BNP: Invalid input(s): POCBNP CBG: No results for input(s): GLUCAP in the last 168 hours. HbA1C: No results for input(s): HGBA1C in the last 72 hours. Urine analysis:    Component Value Date/Time   COLORURINE YELLOW 02/24/2018 1845   APPEARANCEUR HAZY (A) 02/24/2018 1845   LABSPEC 1.020 02/24/2018 1845  PHURINE 5.0 02/24/2018 1845   GLUCOSEU NEGATIVE 02/24/2018 1845   HGBUR MODERATE (A) 02/24/2018 1845   BILIRUBINUR NEGATIVE 02/24/2018 1845   KETONESUR NEGATIVE 02/24/2018 1845   PROTEINUR 30 (A) 02/24/2018 1845   NITRITE NEGATIVE 02/24/2018 1845   LEUKOCYTESUR SMALL (A) 02/24/2018 1845   Sepsis Labs: @LABRCNTIP (procalcitonin:4,lacticidven:4) )No results found for this or any previous visit (from the past  240 hour(s)).   Scheduled Meds: . diclofenac  1 patch Transdermal BID  . DULoxetine  30 mg Oral BID  . furosemide  40 mg Intravenous Daily  . gabapentin  300 mg Oral TID  . hydrOXYzine  25 mg Oral QHS  . magnesium oxide  400 mg Oral Daily  . methocarbamol  750 mg Oral QID  . metoprolol tartrate  75 mg Oral BID  . multivitamin with minerals  1 tablet Oral Daily  . pantoprazole  40 mg Oral BID  . senna-docusate  2 tablet Oral BID  . sodium chloride flush  10-40 mL Intracatheter Q12H  . sodium chloride flush  3 mL Intravenous Q12H   Continuous Infusions: .  ceFAZolin (ANCEF) IV 2 g (03/04/18 1414)    Procedures/Studies: Ct Abdomen Pelvis Wo Contrast  Result Date: 02/08/2018 CLINICAL DATA:  Abdominal pain and fever EXAM: CT ABDOMEN AND PELVIS WITHOUT CONTRAST TECHNIQUE: Multidetector CT imaging of the abdomen and pelvis was performed following the standard protocol without oral or IV contrast. COMPARISON:  None. FINDINGS: Lower chest: There is airspace consolidation throughout the lung bases with several areas of cavitation, likely developing lung abscesses. Several nodular opacities are noted associated with these areas of cavitary consolidation, likely septic emboli. Hepatobiliary: Liver measures 20.5 cm in length. No focal liver lesions are appreciable on this noncontrast enhanced study. The gallbladder is borderline dilated without wall thickening. There is no biliary duct dilatation. Pancreas: No pancreatic mass or inflammatory focus. Spleen: Spleen measures 14.1 x 10.9 x 5.1 cm with a measured splenic volume of 392 cubic cm. No focal splenic lesions are evident. Adrenals/Urinary Tract: Adrenals appear unremarkable bilaterally. 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 No renal mass evident. No hydronephrosis on either side. There is a suspected developing staghorn calculus on the left with somewhat amorphous calcification involving a lower pole calyx on the left extending into the left  renal pelvis, best appreciated on coronal imaging. This developing staghorn calculus measures 3.7 x 1.0 cm. No ureteral calculi are evident. Urinary bladder is decompressed. No urinary bladder wall thickening is appreciable with essentially empty bladder. Stomach/Bowel: There is fluid throughout most small bowel loops. There is no appreciable bowel wall or mesenteric thickening. No evident bowel obstruction. No free air or portal venous air is evident. Vascular/Lymphatic: There is no abdominal aortic aneurysm. No vascular lesions are evident on this noncontrast enhanced study. Note that the attenuation throughout the vascular structures appear slightly diminished which raises question of anemia. No adenopathy is appreciable in abdomen or pelvis. Reproductive: Uterus is anteverted. No pelvic masses evident. There is a slight degree of free fluid in the cul-de-sac region. Other: Appendix is not well seen. There is no periappendiceal region inflammation on this study. No abscess is seen in the abdomen or pelvis. There is no ascites beyond the slight fluid noted in the cul-de-sac region. Musculoskeletal: There are no blastic or lytic bone lesions. No intramuscular or abdominal wall lesions are appreciable. IMPRESSION: Comment: Note that paucity of fat makes assessment in the abdomen and pelvis somewhat less than optimal. 1. Areas of cavitary pneumonia, likely with developing  lung abscesses, in the right lower lobe. Areas of consolidation elsewhere with probable septic emboli associated. 2. Prominent liver and spleen. No focal liver and splenic lesions evident. 3. There is evidence of a degree of nephrocalcinosis. Amorphous calcification in a portion of the left renal pelvis and an inferior left calyx suggest developing staghorn calculus. No hydronephrosis on either side. No well-defined ureteral calculi. 4. Fluid in most loops of small bowel. Suspect a degree of ileus or enteritis. No bowel obstruction. No abscess  evident in the abdomen or pelvis. 5. Small amount of free fluid in the cul-de-sac may be upper physiologic. Electronically Signed   By: Bretta Bang III M.D.   On: 02/08/2018 14:47   Dg Chest 1 View  Result Date: 02/08/2018 CLINICAL DATA:  Hypotension and tachycardia. Smoking history. Asthma. EXAM: CHEST  1 VIEW COMPARISON:  None. FINDINGS: Heart size is within normal limits. Mild prominence of the LEFT hilum. Patchy opacities bilaterally, most prominent at the RIGHT lung base, majority of which have central lucencies suggest cavitary lesions. IMPRESSION: 1. Patchy opacities bilaterally, majority of which have central lucencies suggest cavitary masses/infections. Corresponding cavitary consolidations noted at the lung bases of a CT abdomen performed at the time of today's chest x-ray. Recommend CT chest with contrast for complete evaluation. 2. Questionable prominence of the LEFT hilum, additional cavitary mass/consolidation versus central bronchiectasis. Electronically Signed   By: Bary Richard M.D.   On: 02/08/2018 14:48   Dg Chest 2 View  Result Date: 02/24/2018 CLINICAL DATA:  Short of breath EXAM: CHEST - 2 VIEW COMPARISON:  02/20/2018 FINDINGS: Upper normal heart size. Stable left pleural effusion. Tiny right pleural effusion is stable. Patchy airspace opacities throughout both lungs are not significantly changed. No pneumothorax. IMPRESSION: Bilateral airspace disease and bilateral pleural effusions left greater than right are stable. Electronically Signed   By: Jolaine Click M.D.   On: 02/24/2018 15:50   Mr Lumbar Spine W Wo Contrast  Result Date: 02/26/2018 CLINICAL DATA:  Low back pain since prior abnormal MRI 02/08/2018. Unable to walk. EXAM: MRI LUMBAR SPINE WITHOUT AND WITH CONTRAST TECHNIQUE: Multiplanar and multiecho pulse sequences of the lumbar spine were obtained without and with intravenous contrast. CONTRAST:  7 mL Gadavist COMPARISON:  02/08/2018 FINDINGS: Segmentation:   Standard. Alignment:  Physiologic. Vertebrae:  No fracture, evidence of discitis, or bone lesion. Conus medullaris and cauda equina: Conus extends to the L1 level. Conus and cauda equina appear normal. Paraspinal and other soft tissues: Posterior paraspinal muscle edema bilaterally with mild enhancement on postcontrast imaging. No intramuscular fluid collection or hematoma. Disc levels: Disc spaces: Disc spaces are maintained. T12-L1: No significant disc bulge. No evidence of neural foraminal stenosis. No central canal stenosis. L1-L2: No significant disc bulge. No evidence of neural foraminal stenosis. No central canal stenosis. L2-L3: No significant disc bulge. No evidence of neural foraminal stenosis. No central canal stenosis. L3-L4: No significant disc bulge. No evidence of neural foraminal stenosis. No central canal stenosis. L4-L5: Minimal broad-based disc bulge. No evidence of neural foraminal stenosis. No central canal stenosis. L5-S1: Minimal broad-based disc bulge. No evidence of neural foraminal stenosis. No central canal stenosis. IMPRESSION: 1. No epidural fluid collection.  No discitis or osteomyelitis. 2. Posterior paraspinal muscle edema bilaterally with mild enhancement on postcontrast imaging. No intramuscular fluid collection or hematoma. Differential considerations include muscle strain versus mild myositis. Electronically Signed   By: Elige Ko   On: 02/26/2018 09:53   Mr Lumbar Spine W Wo Contrast  Result Date: 02/08/2018 CLINICAL DATA:  Acute onset of back pain beginning 2 days ago. Study was ordered with contrast but the patient refused. EXAM: MRI LUMBAR SPINE WITHOUT CONTRAST TECHNIQUE: Multiplanar, multisequence MR imaging of the lumbar spine was performed. No intravenous contrast was administered. COMPARISON:  None. FINDINGS: Segmentation:  5 lumbar type vertebral bodies. Alignment:  Normal Vertebrae:  Normal Conus medullaris and cauda equina: Conus extends to the L1 level. Conus  and cauda equina appear normal. Paraspinal and other soft tissues: There is lower paraspinous muscle edema as might be seen with a muscular strain. This appears symmetric from right to left. Disc levels: No abnormality at L3-4 or above. L4-5: Very minimal disc bulge.  No stenosis or neural compression. L5-S1: Very minimal disc bulge.  No stenosis or neural compression. IMPRESSION: Minimal disc bulges at L4-5 and L5-S1. No stenosis or neural compression. Paraspinous muscle edema on both sides in the lower lumbar region as might be seen with a muscular strain. This is nonspecific. The differential diagnosis does include infection, but the symmetric nature would be unusual. The patient refused contrast administration. Electronically Signed   By: Paulina Fusi M.D.   On: 02/08/2018 13:56   Dg Chest Port 1 View  Result Date: 03/01/2018 CLINICAL DATA:  Shortness of breath EXAM: PORTABLE CHEST 1 VIEW COMPARISON:  02/24/2018 FINDINGS: Cardiomegaly. Moderate to large bilateral pleural effusions, stable on the left, increasing on the right since prior study. Diffuse bilateral airspace disease has worsened since prior study and slightly more pronounced on the right. This could reflect asymmetric edema or pneumonia. IMPRESSION: Moderate to large bilateral pleural effusions, increasing on the right since prior study. Diffuse bilateral airspace disease, right greater than left, worsening since prior study. This could reflect asymmetric edema or pneumonia. Electronically Signed   By: Charlett Nose M.D.   On: 03/01/2018 18:14   Dg Chest Port 1 View  Result Date: 02/20/2018 CLINICAL DATA:  Sepsis, endocarditis, cavitary pneumonia with septic emboli and fever. EXAM: PORTABLE CHEST 1 VIEW COMPARISON:  02/08/2018 FINDINGS: Multiple cavitary lesions again identified in both lungs with associated airspace disease in a pattern likely representing septic emboli with multiple areas of cavitary infection. There is a new left lateral  pleural effusion which appears likely loculated and may represent an empyema. Further evaluation with CT of the chest with contrast may be helpful. The heart size is stable. IMPRESSION: Persistent appearance of bilateral cavitary pneumonia likely representing septic emboli. There is a new loculated left lateral pleural effusion that may represent a developing empyema. Further evaluation with CT of the chest with contrast may be helpful. Electronically Signed   By: Irish Lack M.D.   On: 02/20/2018 19:11   [place] Ekg Site Rite  Result Date: 02/26/2018 If Site Rite image not attached, placement could not be confirmed due to current cardiac rhythm.   Catarina Hartshorn, DO  [facility] Pager [phone]  If 7PM-7AM, please contact night-coverage [email or web address] Password [email or web address] 03/04/2018, 2:26 PM   LOS: 24 days

## 2018-03-05 ENCOUNTER — Inpatient Hospital Stay (HOSPITAL_COMMUNITY): Payer: Self-pay

## 2018-03-05 DIAGNOSIS — I5031 Acute diastolic (congestive) heart failure: Secondary | ICD-10-CM

## 2018-03-05 DIAGNOSIS — I959 Hypotension, unspecified: Secondary | ICD-10-CM

## 2018-03-05 LAB — COMPREHENSIVE METABOLIC PANEL
ALT: 10 U/L (ref 0–44)
ANION GAP: 8 (ref 5–15)
AST: 29 U/L (ref 15–41)
Albumin: 1.7 g/dL — ABNORMAL LOW (ref 3.5–5.0)
Alkaline Phosphatase: 117 U/L (ref 38–126)
BUN: 7 mg/dL (ref 6–20)
CO2: 28 mmol/L (ref 22–32)
Calcium: 8.3 mg/dL — ABNORMAL LOW (ref 8.9–10.3)
Chloride: 100 mmol/L (ref 98–111)
Creatinine, Ser: 0.43 mg/dL — ABNORMAL LOW (ref 0.44–1.00)
GFR calc Af Amer: >60 mL/min (ref >60)
GFR calc non Af Amer: >60 mL/min (ref >60)
Glucose, Bld: 93 mg/dL (ref 70–99)
Potassium: 3.6 mmol/L (ref 3.5–5.1)
Sodium: 136 mmol/L (ref 135–145)
Total Bilirubin: 0.2 mg/dL — ABNORMAL LOW (ref 0.3–1.2)
Total Protein: 6.6 g/dL (ref 6.5–8.1)

## 2018-03-05 LAB — CBC
HCT: 28.1 % — ABNORMAL LOW (ref 36.0–46.0)
Hemoglobin: 8 g/dL — ABNORMAL LOW (ref 12.0–15.0)
MCH: 26.1 pg (ref 26.0–34.0)
MCHC: 28.5 g/dL — ABNORMAL LOW (ref 30.0–36.0)
MCV: 91.8 fL (ref 80.0–100.0)
Platelets: 390 10*3/uL (ref 150–400)
RBC: 3.06 MIL/uL — ABNORMAL LOW (ref 3.87–5.11)
RDW: 19.4 % — AB (ref 11.5–15.5)
WBC: 12.6 10*3/uL — ABNORMAL HIGH (ref 4.0–10.5)
nRBC: 0.4 % — ABNORMAL HIGH (ref 0.0–0.2)

## 2018-03-05 LAB — ECHOCARDIOGRAM LIMITED
Height: 63 in
Weight: 2582.03 oz

## 2018-03-05 LAB — ECHOCARDIOGRAM COMPLETE
Height: 63 in
Weight: 2102.31 oz

## 2018-03-05 MED ORDER — METOCLOPRAMIDE HCL 5 MG/ML IJ SOLN
5.0000 mg | Freq: Once | INTRAMUSCULAR | Status: AC
  Administered 2018-03-05: 5 mg via INTRAVENOUS
  Filled 2018-03-05: qty 2

## 2018-03-05 MED ORDER — FUROSEMIDE 10 MG/ML IJ SOLN
40.0000 mg | Freq: Two times a day (BID) | INTRAMUSCULAR | Status: DC
  Administered 2018-03-05 – 2018-03-06 (×2): 40 mg via INTRAVENOUS
  Filled 2018-03-05 (×4): qty 4

## 2018-03-05 NOTE — Progress Notes (Addendum)
PROGRESS NOTE  Amy Mcknight SWH:[id number] DOB: 1995/10/24 DOA: 02/08/2018 PCP: Patient, No Pcp Per   Brief History: 23 y.o.femalewith medical history significant forsubstance use disorderwho presents to the ED with 2 days of right lower back pain. Patient states symptoms began with acute onset while at rest. Symptoms have been persistent. She has noted associated diaphoresis, chills, shortness of breath, cough productive of brown sputum, and right chest wall pain. She denies any rashes or obvious skin changes. She reports good urine output without dysuria. She denies any abdominal pain, diarrhea, or constipation.  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 She continues to use recreational drugs by snorting nasally. She denies any alcohol use. She reports a history of withdrawal from opiates in the past.And is noted to be positive for marijuana and opiates on her UDS.  She is noted to be anemicand is status post 2 unit PRBC transfusion with improvement noted. No overt bleeding currently identified, but patient is complaining of some epigastric abdominal pain. As result of this pain, she has not been eating very much. She denies any nausea or vomiting.GI has evaluated patient with no need for endoscopy noted at this time. She intermittently refuses bathing and laboratory draws. She has been using a bedpan and has not been eating very well. She has been more somnolent from time to time possibly due to oversedation;  In the past week, pt was eating better and conversant and more compliant with medical therapy.  Lasix was started for fluid overload with +18kg since admission.  Repeat limited echo was obtained and showed some progression of her TR.  As a result cardiology was consulted to assist.  Assessment/Plan: Sepsis with early Septic shock -due toMSSA bacteremia, tricuspid vegetation, paravertebral myositis and cavitary pneumonia -sepsis  pathophysiology and septic shock has resolved -Case discussedpreviouslywith infectious disease doctor -continue IV Ancef for total of 6 weeks from date of first negative culture which was 02/12/2018 in a supervised setting ([facility]) -2D echo positive for tricuspid vegetation and moderate regurgitation. 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 -remains afebrile and hemodynamically stable  Acute Diastolic CHF -pt +66 kg since admission -continue lasix 40 mg IV daily-->increase go bid -am BMP -personally reviewed CXR--increased bilateral pleural effusions and interstitial markings -3/220--repeat limited echo--more significant prolapse of TV with severe TR; LVEF 60-65% -I/O--incomplete -daily weights  Hypomagnesemia -replete -start daily mag ox--increase to bid  Hypokalemia -replete -start daily KCl  AKI (acute kidney injury) (HCC) -resolved -serum creatinine peaked 2.61  Substance use disorder/opiate withdrawal syndrome -she appears to be resting comfortably for extended/long periods of time without any evidence of pain, patient has been weaned off Dilaudid, -okay to continuePRN low-dose oxycodone continuemethocarbamol 750mg  4 times daily, Cymbalta 30 mg twice daily and gabapentin 300 mg 3 times daily -c/n hydroxyzine 25 mg nightly -UDS on 02/24/2018 shows opiates --- this was done after there was suspicion of possible inhalation/snorting of drugs in the room on the telemetry sitter camera--- patient did receive Dilaudid here in the hospital, cannot rule out concomitant inhalation/snorting of opiates on 02/24/18 -Dilaudid has been discontinued patient only has PRN oxycodone 5 mg at this time -she  is out of the window for active withdrawal symptoms  Tobacco Abuse-- -Extensive  cessation counseling provided--continue nicotine patch   positive hepatitis C antibody--- -Quantitative viral load test undetectable;-No treatment needed  Generalized weakness and debility--- -continues tointermittently refuseto work with physical therapy,  -patient continues to refuse to participate in ADLs often, wanting to have staff do more and more for her -complaints of back pain as of 02/25/2018, patient currently has MSSA bacteremia and back pain--- lumbar MRI from 02/26/2018 without any evidence of epidural abscess or significant inflammation , (prior lumbar MRI from 02/08/2018 was without contrast as patient refused contrast at the time ) -OOB with meals  Anemia of chronic disease -suspecthemolysis in the setting of acute infection-stable -Anemia panelrevealed some iron deficiency and stool occult is negative -2 unit PRBC orderedon 02/14/18--- Hgb remains stable post prior transfusion -Appreciate GI evaluation with no recommendations for endoscopy at this time -Continue p.o. PPI, --Ferahemegiven during this admission  -3/1-given dose ferrelicit  Depression/Anxiety---- -stable -continue cymbalta -may use hydroxyzine for sleep and anxiety -add hydroxyzine bid prn    Disposition Plan: Home on 03/26/18 if stable Family Communication:NoFamily at bedside  Consultants:ID  Code Status: FULL   DVT Prophylaxis: SCDs   Procedures: As Listed in Progress Note Above  Antibiotics: Cefazolin 2/7>>> vanco2/7    Subjective: Pt states she is breathing better.  She had some nausea after eating pizza.  Denies cp, vomiting, diarrhea, headache.  Back pain about same as last week.  Objective: Vitals:   03/04/18 2147 03/05/18 0500 03/05/18 0612 03/05/18 1301  BP: (!) 126/99  (!) 129/99 (!) 122/91  Pulse: (!) 109  91 (!) 103  Resp:    19  Temp: 97.8 F (36.6 C)   98.2 F (36.8 C) 98.4 F (36.9 C)  TempSrc: Oral  Oral Oral  SpO2: 99%  100% 100%  Weight:  73.2 kg    Height:        Intake/Output Summary (Last 24 hours) at 03/05/2018 1616 Last data filed at 03/05/2018 1100 Gross per 24 hour  Intake 670 ml  Output 1000 ml  Net -330 ml   Weight change: 0.1 kg Exam:   General:  Pt is alert, follows commands appropriately, not in acute distress  HEENT: No icterus, No thrush, No neck mass, [place]/AT  Cardiovascular: RRR, S1/S2, +S3  Respiratory: bibasilar rales. No wheeze  Abdomen: Soft/+BS, non tender, non distended, no guarding  Extremities: 1 + LE edema, No lymphangitis, No petechiae, No rashes, no synovitis   Data Reviewed: I have personally reviewed following labs and imaging studies Basic Metabolic Panel: Recent Labs  Lab 03/02/18 0606 03/03/18 0716 03/04/18 0645 03/05/18 0635  NA 136 135 137 136  K 3.7 3.6 3.4* 3.6  CL 103 102 102 100  CO2 26 27 27 28   GLUCOSE 100* 94 92 93  BUN 8 7 8 7   CREATININE 0.45 0.42* 0.49 0.43*  CALCIUM 8.0* 8.0* 8.0* 8.3*  MG 1.3* 1.6* 1.4*  --   PHOS 3.8  --   --   --    Liver Function Tests: Recent Labs  Lab 03/02/18 0606 03/05/18 0635  AST 45* 29  ALT 15 10  ALKPHOS 111 117  BILITOT 0.2* 0.2*  PROT 6.4* 6.6  ALBUMIN 1.6* 1.7*   No results for input(s): LIPASE, AMYLASE in the last 168 hours. No results for input(s): AMMONIA in the last 168 hours. Coagulation Profile: No results for input(s): INR, PROTIME in the last 168 hours. CBC: Recent Labs  Lab 03/02/18 0606 03/04/18 0645 03/05/18 0635  WBC 12.8* 12.2* 12.6*  NEUTROABS 7.6  --   --   HGB 7.9* 7.6* 8.0*  HCT 27.9* 26.2* 28.1*  MCV 90.6 91.0 91.8  PLT 388 352 390   Cardiac Enzymes: Recent Labs  Lab 03/02/18 0606  CKTOTAL 14*   BNP: Invalid input(s): POCBNP CBG: No results for input(s): GLUCAP in the last 168 hours. HbA1C: No results for input(s): HGBA1C in the last 72 hours. Urine analysis:    Component Value  Date/Time   COLORURINE YELLOW 02/24/2018 1845   APPEARANCEUR HAZY (A) 02/24/2018 1845   LABSPEC 1.020 02/24/2018 1845   PHURINE 5.0 02/24/2018 1845   GLUCOSEU NEGATIVE 02/24/2018 1845   HGBUR MODERATE (A) 02/24/2018 1845   BILIRUBINUR NEGATIVE 02/24/2018 1845   KETONESUR NEGATIVE 02/24/2018 1845   PROTEINUR 30 (A) 02/24/2018 1845   NITRITE NEGATIVE 02/24/2018 1845   LEUKOCYTESUR SMALL (A) 02/24/2018 1845   Sepsis Labs: @LABRCNTIP (procalcitonin:4,lacticidven:4) )No results found for this or any previous visit (from the past 240 hour(s)).   Scheduled Meds: . diclofenac  1 patch Transdermal BID  . DULoxetine  30 mg Oral BID  . furosemide  40 mg Intravenous BID  . gabapentin  300 mg Oral TID  . hydrOXYzine  25 mg Oral QHS  . magnesium oxide  400 mg Oral BID  . methocarbamol  750 mg Oral QID  . metoprolol tartrate  75 mg Oral BID  . multivitamin with minerals  1 tablet Oral Daily  . pantoprazole  40 mg Oral BID  . potassium chloride  20 mEq Oral Daily  . senna-docusate  2 tablet Oral BID  . sodium chloride flush  10-40 mL Intracatheter Q12H  . sodium chloride flush  3 mL Intravenous Q12H   Continuous Infusions: .  ceFAZolin (ANCEF) IV 2 g (03/05/18 1300)    Procedures/Studies: Ct Abdomen Pelvis Wo Contrast  Result Date: 02/08/2018 CLINICAL DATA:  Abdominal pain and fever EXAM: CT ABDOMEN AND PELVIS WITHOUT CONTRAST TECHNIQUE: Multidetector CT imaging of the abdomen and pelvis was performed following the standard protocol without oral or IV contrast. COMPARISON:  None. FINDINGS: Lower chest: There is airspace consolidation throughout the lung bases with several areas of cavitation, likely developing lung abscesses. Several nodular opacities are noted associated with these areas of cavitary consolidation, likely septic emboli. Hepatobiliary: Liver measures 20.5 cm in length. No focal liver lesions are appreciable on this noncontrast enhanced study. The gallbladder is borderline  dilated without wall thickening. There is no biliary duct dilatation. Pancreas: No pancreatic mass or inflammatory focus. Spleen: Spleen measures 14.1 x 10.9 x 5.1 cm with a measured splenic volume of 392 cubic cm. No focal splenic lesions are evident. Adrenals/Urinary Tract: Adrenals appear unremarkable bilaterally. 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 No renal mass evident. No hydronephrosis on either side. There is a suspected developing staghorn calculus on the left with somewhat amorphous calcification involving a lower pole calyx on the left extending into the left renal pelvis, best appreciated on coronal imaging. This developing staghorn calculus measures 3.7 x 1.0 cm. No ureteral calculi are evident. Urinary bladder is decompressed. No urinary bladder wall thickening is appreciable with essentially empty bladder. Stomach/Bowel: There is fluid throughout most small bowel loops. There is no appreciable bowel wall or mesenteric thickening. No evident bowel obstruction. No free air or portal venous air is evident. Vascular/Lymphatic: There is no abdominal aortic aneurysm. No vascular lesions are evident on this noncontrast enhanced study. Note that the attenuation throughout the vascular structures appear slightly diminished which raises question of anemia. No  adenopathy is appreciable in abdomen or pelvis. Reproductive: Uterus is anteverted. No pelvic masses evident. There is a slight degree of free fluid in the cul-de-sac region. Other: Appendix is not well seen. There is no periappendiceal region inflammation on this study. No abscess is seen in the abdomen or pelvis. There is no ascites beyond the slight fluid noted in the cul-de-sac region. Musculoskeletal: There are no blastic or lytic bone lesions. No intramuscular or abdominal wall lesions are appreciable. IMPRESSION: Comment: Note that paucity of fat makes assessment in the abdomen and pelvis somewhat less than optimal. 1. Areas of cavitary  pneumonia, likely with developing lung abscesses, in the right lower lobe. Areas of consolidation elsewhere with probable septic emboli associated. 2. Prominent liver and spleen. No focal liver and splenic lesions evident. 3. There is evidence of a degree of nephrocalcinosis. Amorphous calcification in a portion of the left renal pelvis and an inferior left calyx suggest developing staghorn calculus. No hydronephrosis on either side. No well-defined ureteral calculi. 4. Fluid in most loops of small bowel. Suspect a degree of ileus or enteritis. No bowel obstruction. No abscess evident in the abdomen or pelvis. 5. Small amount of free fluid in the cul-de-sac may be upper physiologic. Electronically Signed   By: Bretta Bang III M.D.   On: 02/08/2018 14:47   Dg Chest 1 View  Result Date: 02/08/2018 CLINICAL DATA:  Hypotension and tachycardia. Smoking history. Asthma. EXAM: CHEST  1 VIEW COMPARISON:  None. FINDINGS: Heart size is within normal limits. Mild prominence of the LEFT hilum. Patchy opacities bilaterally, most prominent at the RIGHT lung base, majority of which have central lucencies suggest cavitary lesions. IMPRESSION: 1. Patchy opacities bilaterally, majority of which have central lucencies suggest cavitary masses/infections. Corresponding cavitary consolidations noted at the lung bases of a CT abdomen performed at the time of today's chest x-ray. Recommend CT chest with contrast for complete evaluation. 2. Questionable prominence of the LEFT hilum, additional cavitary mass/consolidation versus central bronchiectasis. Electronically Signed   By: Bary Richard M.D.   On: 02/08/2018 14:48   Dg Chest 2 View  Result Date: 02/24/2018 CLINICAL DATA:  Short of breath EXAM: CHEST - 2 VIEW COMPARISON:  02/20/2018 FINDINGS: Upper normal heart size. Stable left pleural effusion. Tiny right pleural effusion is stable. Patchy airspace opacities throughout both lungs are not significantly changed. No  pneumothorax. IMPRESSION: Bilateral airspace disease and bilateral pleural effusions left greater than right are stable. Electronically Signed   By: Jolaine Click M.D.   On: 02/24/2018 15:50   Mr Lumbar Spine W Wo Contrast  Result Date: 02/26/2018 CLINICAL DATA:  Low back pain since prior abnormal MRI 02/08/2018. Unable to walk. EXAM: MRI LUMBAR SPINE WITHOUT AND WITH CONTRAST TECHNIQUE: Multiplanar and multiecho pulse sequences of the lumbar spine were obtained without and with intravenous contrast. CONTRAST:  7 mL Gadavist COMPARISON:  02/08/2018 FINDINGS: Segmentation:  Standard. Alignment:  Physiologic. Vertebrae:  No fracture, evidence of discitis, or bone lesion. Conus medullaris and cauda equina: Conus extends to the L1 level. Conus and cauda equina appear normal. Paraspinal and other soft tissues: Posterior paraspinal muscle edema bilaterally with mild enhancement on postcontrast imaging. No intramuscular fluid collection or hematoma. Disc levels: Disc spaces: Disc spaces are maintained. T12-L1: No significant disc bulge. No evidence of neural foraminal stenosis. No central canal stenosis. L1-L2: No significant disc bulge. No evidence of neural foraminal stenosis. No central canal stenosis. L2-L3: No significant disc bulge. No evidence of neural foraminal stenosis. No  central canal stenosis. L3-L4: No significant disc bulge. No evidence of neural foraminal stenosis. No central canal stenosis. L4-L5: Minimal broad-based disc bulge. No evidence of neural foraminal stenosis. No central canal stenosis. L5-S1: Minimal broad-based disc bulge. No evidence of neural foraminal stenosis. No central canal stenosis. IMPRESSION: 1. No epidural fluid collection.  No discitis or osteomyelitis. 2. Posterior paraspinal muscle edema bilaterally with mild enhancement on postcontrast imaging. No intramuscular fluid collection or hematoma. Differential considerations include muscle strain versus mild myositis. Electronically  Signed   By: Elige Ko   On: 02/26/2018 09:53   Mr Lumbar Spine W Wo Contrast  Result Date: 02/08/2018 CLINICAL DATA:  Acute onset of back pain beginning 2 days ago. Study was ordered with contrast but the patient refused. EXAM: MRI LUMBAR SPINE WITHOUT CONTRAST TECHNIQUE: Multiplanar, multisequence MR imaging of the lumbar spine was performed. No intravenous contrast was administered. COMPARISON:  None. FINDINGS: Segmentation:  5 lumbar type vertebral bodies. Alignment:  Normal Vertebrae:  Normal Conus medullaris and cauda equina: Conus extends to the L1 level. Conus and cauda equina appear normal. Paraspinal and other soft tissues: There is lower paraspinous muscle edema as might be seen with a muscular strain. This appears symmetric from right to left. Disc levels: No abnormality at L3-4 or above. L4-5: Very minimal disc bulge.  No stenosis or neural compression. L5-S1: Very minimal disc bulge.  No stenosis or neural compression. IMPRESSION: Minimal disc bulges at L4-5 and L5-S1. No stenosis or neural compression. Paraspinous muscle edema on both sides in the lower lumbar region as might be seen with a muscular strain. This is nonspecific. The differential diagnosis does include infection, but the symmetric nature would be unusual. The patient refused contrast administration. Electronically Signed   By: Paulina Fusi M.D.   On: 02/08/2018 13:56   Dg Chest Port 1 View  Result Date: 03/01/2018 CLINICAL DATA:  Shortness of breath EXAM: PORTABLE CHEST 1 VIEW COMPARISON:  02/24/2018 FINDINGS: Cardiomegaly. Moderate to large bilateral pleural effusions, stable on the left, increasing on the right since prior study. Diffuse bilateral airspace disease has worsened since prior study and slightly more pronounced on the right. This could reflect asymmetric edema or pneumonia. IMPRESSION: Moderate to large bilateral pleural effusions, increasing on the right since prior study. Diffuse bilateral airspace disease,  right greater than left, worsening since prior study. This could reflect asymmetric edema or pneumonia. Electronically Signed   By: Charlett Nose M.D.   On: 03/01/2018 18:14   Dg Chest Port 1 View  Result Date: 02/20/2018 CLINICAL DATA:  Sepsis, endocarditis, cavitary pneumonia with septic emboli and fever. EXAM: PORTABLE CHEST 1 VIEW COMPARISON:  02/08/2018 FINDINGS: Multiple cavitary lesions again identified in both lungs with associated airspace disease in a pattern likely representing septic emboli with multiple areas of cavitary infection. There is a new left lateral pleural effusion which appears likely loculated and may represent an empyema. Further evaluation with CT of the chest with contrast may be helpful. The heart size is stable. IMPRESSION: Persistent appearance of bilateral cavitary pneumonia likely representing septic emboli. There is a new loculated left lateral pleural effusion that may represent a developing empyema. Further evaluation with CT of the chest with contrast may be helpful. Electronically Signed   By: Irish Lack M.D.   On: 02/20/2018 19:11   [place] Ekg Site Rite  Result Date: 02/26/2018 If Site Rite image not attached, placement could not be confirmed due to current cardiac rhythm.   Catarina Hartshorn, DO  [facility] Pager [phone]  If 7PM-7AM, please contact night-coverage [email or web address] Password [email or web address] 03/05/2018, 4:16 PM   LOS: 25 days

## 2018-03-05 NOTE — Progress Notes (Signed)
*  PRELIMINARY RESULTS* Echocardiogram 2D Echocardiogram LIMITED has been performed.  Amy Mcknight 03/05/2018, 3:50 PM

## 2018-03-05 NOTE — Progress Notes (Signed)
Patient asked that I come back later to do echo.

## 2018-03-06 DIAGNOSIS — R7881 Bacteremia: Secondary | ICD-10-CM

## 2018-03-06 DIAGNOSIS — I5031 Acute diastolic (congestive) heart failure: Secondary | ICD-10-CM

## 2018-03-06 DIAGNOSIS — I368 Other nonrheumatic tricuspid valve disorders: Secondary | ICD-10-CM

## 2018-03-06 LAB — BASIC METABOLIC PANEL
Anion gap: 8 (ref 5–15)
BUN: 8 mg/dL (ref 6–20)
CO2: 29 mmol/L (ref 22–32)
CREATININE: 0.44 mg/dL (ref 0.44–1.00)
Calcium: 8.2 mg/dL — ABNORMAL LOW (ref 8.9–10.3)
Chloride: 100 mmol/L (ref 98–111)
GFR calc Af Amer: >60 mL/min (ref >60)
GFR calc non Af Amer: >60 mL/min (ref >60)
Glucose, Bld: 97 mg/dL (ref 70–99)
Potassium: 3.8 mmol/L (ref 3.5–5.1)
SODIUM: 137 mmol/L (ref 135–145)

## 2018-03-06 LAB — MAGNESIUM: Magnesium: 1.5 mg/dL — ABNORMAL LOW (ref 1.7–2.4)

## 2018-03-06 MED ORDER — MAGNESIUM SULFATE 2 GM/50ML IV SOLN
2.0000 g | Freq: Once | INTRAVENOUS | Status: AC
  Administered 2018-03-06: 2 g via INTRAVENOUS
  Filled 2018-03-06: qty 50

## 2018-03-06 NOTE — Consult Note (Signed)
Cardiology Consultation:   Patient ID: Catlin Mckiver MRN: [id number]; DOB: April 23, 1995  Admit date: 02/08/2018 Date of Consult: 03/06/2018  Primary Care Provider: Patient, No Pcp Per Primary Cardiologist: No primary care provider on file.  Primary Electrophysiologist:  None    Patient Profile:   Zekiah Steve is a 23 y.o. female with a hx of substance abuse/IV drug abuse who is being seen today for the evaluation of tricuspid regurgitation at the request of Dr Tat.  History of Present Illness:   Ms. Toia 23 yo female history of substance abuse/IV drug abuse admitted early February with back pain, SOB, chills, and cough. Complicated admission for sepsis with cavitary pneumonia, MSSA bacteremia and TV endocarditis, and paravertebral myositis. Followed by ID with plans for 6 weeks of abx, cardiology is consulted for TV endocarditis and severe TR with volume overload, reported 18 kg weight gain.   K 3.6 Cr 0.43 WBC 12.6 Hgb 8 Plt 390 Mg 1.5  Echo 2.5 x 1.4 cm TV valve vegetation of septal leaflet, severe TR, dilated RV with normal function    Past Medical History:  Diagnosis Date  . Substance use disorder     History reviewed. No pertinent surgical history.   Inpatient Medications: Scheduled Meds: . diclofenac  1 patch Transdermal BID  . DULoxetine  30 mg Oral BID  . furosemide  40 mg Intravenous BID  . gabapentin  300 mg Oral TID  . hydrOXYzine  25 mg Oral QHS  . magnesium oxide  400 mg Oral BID  . methocarbamol  750 mg Oral QID  . metoprolol tartrate  75 mg Oral BID  . multivitamin with minerals  1 tablet Oral Daily  . pantoprazole  40 mg Oral BID  . potassium chloride  20 mEq Oral Daily  . senna-docusate  2 tablet Oral BID  . sodium chloride flush  10-40 mL Intracatheter Q12H  . sodium chloride flush  3 mL Intravenous Q12H   Continuous Infusions: .  ceFAZolin (ANCEF) IV 2 g (03/06/18 0501)  . magnesium sulfate 1 - 4 g bolus IVPB     PRN Meds: acetaminophen **OR**  acetaminophen, albuterol, budesonide (PULMICORT) nebulizer solution, guaiFENesin-dextromethorphan, hydrOXYzine, ipratropium-albuterol, ondansetron **OR** ondansetron (ZOFRAN) IV, oxyCODONE, pentafluoroprop-tetrafluoroeth, sodium chloride flush  Allergies:    Allergies  Allergen Reactions  . Penicillins Other (See Comments)    Diaper rash as an infant per mother    Social History:   Social History   Socioeconomic History  . Marital status: Single    Spouse name: Not on file  . Number of children: Not on file  . Years of education: Not on file  . Highest education level: Not on file  Occupational History  . Not on file  Social Needs  . Financial resource strain: Not on file  . Food insecurity:    Worry: Not on file    Inability: Not on file  . Transportation needs:    Medical: Not on file    Non-medical: Not on file  Tobacco Use  . Smoking status: Current Every Day Smoker    Packs/day: 0.50    Types: Cigarettes  . Smokeless tobacco: Never Used  Substance and Sexual Activity  . Alcohol use: Not Currently  . Drug use: Yes    Types: Heroin  . Sexual activity: Not on file  Lifestyle  . Physical activity:    Days per week: Not on file    Minutes per session: Not on file  . Stress: Not on file  Relationships  . Social connections:    Talks on phone: Not on file    Gets together: Not on file    Attends religious service: Not on file    Active member of club or organization: Not on file    Attends meetings of clubs or organizations: Not on file    Relationship status: Not on file  . Intimate partner violence:    Fear of current or ex partner: Not on file    Emotionally abused: Not on file    Physically abused: Not on file    Forced sexual activity: Not on file  Other Topics Concern  . Not on file  Social History Narrative  . Not on file    Family History:    Family History  Problem Relation Age of Onset  . Diabetes Mother      ROS:  Please see the history of  present illness.  All other ROS reviewed and negative.     Physical Exam/Data:   Vitals:   03/05/18 2100 03/05/18 2106 03/06/18 0500 03/06/18 0621  BP:  (!) 124/99  (!) 122/100  Pulse:  (!) 115  97  Resp:    18  Temp:  97.6 F (36.4 C)    TempSrc:  Oral    SpO2: 100% 98%  98%  Weight:   72.1 kg   Height:        Intake/Output Summary (Last 24 hours) at 03/06/2018 0953 Last data filed at 03/05/2018 1100 Gross per 24 hour  Intake -  Output 600 ml  Net -600 ml   Last 3 Weights 03/06/2018 03/05/2018 03/04/2018  Weight (lbs) 158 lb 15.2 oz 161 lb 6 oz 161 lb 2.5 oz  Weight (kg) 72.1 kg 73.2 kg 73.1 kg     Body mass index is 28.16 kg/m.  General:  Well nourished, well developed, in no acute distress HEENT: normal Lymph: no adenopathy Neck: elevated JVD Endocrine:  No thryomegaly Cardiac:  normal S1, S2; RRR; 2/6 systolic murmur LLSB  Lungs:  clear to auscultation bilaterally, no wheezing, rhonchi or rales  Abd: soft, nontender, no hepatomegaly  Ext: 2+ bilateral LE edema Musculoskeletal:  No deformities, BUE and BLE strength normal and equal Skin: warm and dry  Neuro:  CNs 2-12 intact, no focal abnormalities noted Psych:  Normal affect     Laboratory Data:  Chemistry Recent Labs  Lab 03/04/18 0645 03/05/18 0635 03/06/18 0459  NA 137 136 137  K 3.4* 3.6 3.8  CL 102 100 100  CO2 GLUCOSE 92 93 97  BUN CREATININE 0.49 0.43* 0.44  CALCIUM 8.0* 8.3* 8.2*  GFRNONAA >60 >60 >60  GFRAA >60 >60 >60  ANIONGAP Recent Labs  Lab 03/02/18 0606 03/05/18 0635  PROT 6.4* 6.6  ALBUMIN 1.6* 1.7*  AST 45* 29  ALT 15 10  ALKPHOS 111 117  BILITOT 0.2* 0.2*   Hematology Recent Labs  Lab 03/02/18 0606 03/04/18 0645 03/05/18 0635  WBC 12.8* 12.2* 12.6*  RBC 3.08* 2.88* 3.06*  HGB 7.9* 7.6* 8.0*  HCT 27.9* 26.2* 28.1*  MCV 90.6 91.0 91.8  MCH 25.6* 26.4 26.1  MCHC 28.3* 29.0* 28.5*  RDW 18.8* 19.1* 19.4*  PLT 388 352 390   Cardiac EnzymesNo  results for input(s): TROPONINI in the last 168 hours. No results for input(s): TROPIPOC in the last 168 hours.  BNP Recent Labs  Lab 03/03/18 0716  BNP 614.0*  DDimer No results for input(s): DDIMER in the last 168 hours.  Radiology/Studies:  No results found.  Assessment and Plan:   1. 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 She is on lasix 40mg  IV bid. Weight down from 73 to 72 kg today. Remains severely volume overloaded by exam.  - continue IV diuretics, likely will require several days. Goal would be net negative 2-3 L per day.   - review of the guidelines for surgical intervention for right sided endocarditis show very limited indications for surgery, and even those are quite soft (IIa). Even for severe TR with heart failure initial recs are for medical therapy, if refactory could consider surgical intervention,howver it is actually a IIa indication from the guideline to consider avoiding surgery in IV drug abusers.   Essentially guidelines do not support any form of surgical interevention at this time. Goals would be continue medical therapy for her endocarditis and HF with ongoing diuresis.   No additional cardiology recs at this time, if difficultly diuresing please contact us back   [facility] will sign off.   Medication Recommendations:  Lasix 40mg  bid Other recommendations (labs, testing, etc):  none Follow up as an outpatient:  May contact us at time of discharge   For questions or updates, please contact [facility] Please consult [email or web address] for contact info under     Signed, Dina Rich, MD  03/06/2018 9:53 AM

## 2018-03-06 NOTE — Progress Notes (Signed)
PROGRESS NOTE  Amy Mcknight VWP:[id number] DOB: June 23, 1995 DOA: 02/08/2018 PCP: Patient, No Pcp Per  Brief History: 23 y.o.femalewith medical history significant forsubstance use disorderwho presents to the ED with 2 days of right lower back pain. Patient states symptoms began with acute onset while at rest. Symptoms have been persistent. She has noted associated diaphoresis, chills, shortness of breath, cough productive of brown sputum, and right chest wall pain. She denies any rashes or obvious skin changes. She reports good urine output without dysuria. She denies any abdominal pain, diarrhea, or constipation.  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 She continues to use recreational drugs by snorting nasally. She denies any alcohol use. She reports a history of withdrawal from opiates in the past.And is noted to be positive for marijuana and opiates on her UDS.  She is noted to be anemicand is status post 2 unit PRBC transfusion with improvement noted. No overt bleeding currently identified, but patient is complaining of some epigastric abdominal pain. As result of this pain, she has not been eating very much. She denies any nausea or vomiting.GI has evaluated patient with no need for endoscopy noted at this time. She intermittently refuses bathing and laboratory draws. She has been using a bedpan and has not been eating very well. She has been more somnolent from time to time possibly due to oversedation;  In the past week, pt was eating better and conversant and more compliant with medical therapy.  Lasix was started for fluid overload with +18kg since admission.  Repeat limited echo was obtained and showed some progression of her TR.  As a result cardiology was consulted to assist.  She is not a candidate for TV replacement/repair currently.  Assessment/Plan: Sepsis with early Septic shock -due toMSSA bacteremia, tricuspid  endocarditis, paravertebral myositis and cavitary pneumonia -sepsis pathophysiology and septic shock has resolved -Case discussedpreviouslywith infectious disease doctor -continue IV Ancef for total of 6 weeks from date of first negative culture which was 02/12/2018 in a supervised setting ([facility]) -2D echo positive for tricuspid vegetation and moderate regurgitation. 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61  Substance use disorder/opiate withdrawal syndrome -she appears to be resting comfortably for extended/long periods of time without any evidence of pain, patient has been weaned off Dilaudid, -okay to continuePRN low-dose oxycodone continuemethocarbamol 750mg  4 times daily, Cymbalta 30 mg twice daily and gabapentin 300 mg 3 times daily -continue hydroxyzine 25 mg nightly -UDS on 02/24/2018 shows opiates --- this was done after there was suspicion of possible inhalation/snorting of drugs in the room on the telemetry sitter camera -Dilaudid has been discontinued patient only has PRN oxycodone 5 mg  at this time -she is out of the window for active withdrawal symptoms  Tobacco Abuse-- -Extensive cessation  counseling provided--continue nicotine patch   positive hepatitis C antibody--- -Quantitative viral load test undetectable; --No treatment needed -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  Generalized weakness and debility--- -continues tointermittently refuseto work with physical therapy,  -patient continues to refuse to participate in ADLs often, wanting to have staff do more and more for her -complaints of back pain as of 02/25/2018, patient currently has MSSA bacteremia and back pain--- lumbar MRI from 02/26/2018 without any evidence of epidural abscess or significant inflammation , (prior lumbar MRI from 02/08/2018 was without contrast as patient refused contrast at the time ) -OOB with meals  Anemia of chronic disease -suspecthemolysis in the setting of acute infection-stable -Anemia panelrevealed some iron deficiency and stool occult is negative -2 unit PRBC orderedon 02/14/18--- Hgb remains stable post prior transfusion -Appreciate GI evaluation with no recommendations for endoscopy at this time -Continue p.o. PPI, --Ferahemegiven during this admission -3/1-given dose ferrelicit  Depression/Anxiety---- -stable -continue cymbalta -may use hydroxyzine for sleep and anxiety -add hydroxyzine bid prn    Disposition Plan: Home on 03/26/18 if stable Family Communication:NoFamily at bedside  Consultants:ID  Code Status: FULL   DVT Prophylaxis: SCDs   Procedures: As Listed in Progress Note Above  Antibiotics: Cefazolin 2/7>>> vanco2/7     Subjective: Pt is breathing better.  Patient denies fevers, chills, headache, chest pain, dyspnea, nausea, vomiting, diarrhea, abdominal pain, dysuria, hematuria, hematochezia, and melena.   Objective: Vitals:   03/05/18 2100 03/05/18 2106 03/06/18 0500 03/06/18 0621  BP:  (!) 124/99  (!) 122/100    Pulse:  (!) 115  97  Resp:    18  Temp:  97.6 F (36.4 C)    TempSrc:  Oral    SpO2: 100% 98%  98%  Weight:   72.1 kg   Height:        Intake/Output Summary (Last 24 hours) at 03/06/2018 1643 Last data filed at 03/06/2018 1600 Gross per 24 hour  Intake 100 ml  Output 1 ml  Net 99 ml   Weight change: -1.1 kg Exam:   General:  Pt is alert, follows commands appropriately, not in acute distress  HEENT: No icterus, No thrush, No neck mass, [place]/AT  Cardiovascular: RRR, S1/S2, no rubs, no gallops  Respiratory: bilateral rales  Abdomen: Soft/+BS, non tender, non distended, no guarding  Extremities: 2 + LE edema, No lymphangitis, No petechiae, No rashes, no synovitis   Data Reviewed: I have personally reviewed following labs and imaging studies Basic Metabolic Panel: Recent Labs  Lab 03/02/18 0606 03/03/18 0716 03/04/18 0645 03/05/18 0635 03/06/18 0459  NA 136 135 137 136 137  K 3.7 3.6 3.4* 3.6 3.8  CL 103 102 102 100 100  CO2 GLUCOSE 100* 94 92 93 97  BUN CREATININE 0.45 0.42* 0.49 0.43* 0.44  CALCIUM 8.0* 8.0* 8.0* 8.3* 8.2*  MG 1.3* 1.6* 1.4*  --  1.5*  PHOS 3.8  --   --   --   --    Liver Function Tests: Recent Labs  Lab 03/02/18 0606 03/05/18 0635  AST 45* 29  ALT 15 10  ALKPHOS 111 117  BILITOT 0.2* 0.2*  PROT 6.4* 6.6  ALBUMIN 1.6* 1.7*   No results for input(s): LIPASE, AMYLASE in the last 168 hours. No results for input(s): AMMONIA in the last 168 hours. Coagulation Profile: No results for input(s): INR, PROTIME in the last 168 hours. CBC: Recent Labs  Lab 03/02/18  0601 03/04/18 0645 03/05/18 0635  WBC 12.8* 12.2* 12.6*  NEUTROABS 7.6  --   --   HGB 7.9* 7.6* 8.0*  HCT 27.9* 26.2* 28.1*  MCV 90.6 91.0 91.8  PLT 388 352 390   Cardiac Enzymes: Recent Labs  Lab 03/02/18 0606  CKTOTAL 14*   BNP: Invalid input(s): POCBNP CBG: No results for input(s): GLUCAP in the last 168 hours. HbA1C: No results for  input(s): HGBA1C in the last 72 hours. Urine analysis:    Component Value Date/Time   COLORURINE YELLOW 02/24/2018 1845   APPEARANCEUR HAZY (A) 02/24/2018 1845   LABSPEC 1.020 02/24/2018 1845   PHURINE 5.0 02/24/2018 1845   GLUCOSEU NEGATIVE 02/24/2018 1845   HGBUR MODERATE (A) 02/24/2018 1845   BILIRUBINUR NEGATIVE 02/24/2018 1845   KETONESUR NEGATIVE 02/24/2018 1845   PROTEINUR 30 (A) 02/24/2018 1845   NITRITE NEGATIVE 02/24/2018 1845   LEUKOCYTESUR SMALL (A) 02/24/2018 1845   Sepsis Labs: @LABRCNTIP (procalcitonin:4,lacticidven:4) )No results found for this or any previous visit (from the past 240 hour(s)).   Scheduled Meds: . diclofenac  1 patch Transdermal BID  . DULoxetine  30 mg Oral BID  . furosemide  40 mg Intravenous BID  . gabapentin  300 mg Oral TID  . hydrOXYzine  25 mg Oral QHS  . magnesium oxide  400 mg Oral BID  . methocarbamol  750 mg Oral QID  . metoprolol tartrate  75 mg Oral BID  . multivitamin with minerals  1 tablet Oral Daily  . pantoprazole  40 mg Oral BID  . potassium chloride  20 mEq Oral Daily  . senna-docusate  2 tablet Oral BID  . sodium chloride flush  10-40 mL Intracatheter Q12H  . sodium chloride flush  3 mL Intravenous Q12H   Continuous Infusions: .  ceFAZolin (ANCEF) IV 2 g (03/06/18 1432)    Procedures/Studies: Ct Abdomen Pelvis Wo Contrast  Result Date: 02/08/2018 CLINICAL DATA:  Abdominal pain and fever EXAM: CT ABDOMEN AND PELVIS WITHOUT CONTRAST TECHNIQUE: Multidetector CT imaging of the abdomen and pelvis was performed following the standard protocol without oral or IV contrast. COMPARISON:  None. FINDINGS: Lower chest: There is airspace consolidation throughout the lung bases with several areas of cavitation, likely developing lung abscesses. Several nodular opacities are noted associated with these areas of cavitary consolidation, likely septic emboli. Hepatobiliary: Liver measures 20.5 cm in length. No focal liver lesions are  appreciable on this noncontrast enhanced study. The gallbladder is borderline dilated without wall thickening. There is no biliary duct dilatation. Pancreas: No pancreatic mass or inflammatory focus. Spleen: Spleen measures 14.1 x 10.9 x 5.1 cm with a measured splenic volume of 392 cubic cm. No focal splenic lesions are evident. Adrenals/Urinary Tract: Adrenals appear unremarkable bilaterally. 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 No renal mass evident. No hydronephrosis on either side. There is a suspected developing staghorn calculus on the left with somewhat amorphous calcification involving a lower pole calyx on the left extending into the left renal pelvis, best appreciated on coronal imaging. This developing staghorn calculus measures 3.7 x 1.0 cm. No ureteral calculi are evident. Urinary bladder is decompressed. No urinary bladder wall thickening is appreciable with essentially empty bladder. Stomach/Bowel: There is fluid throughout most small bowel loops. There is no appreciable bowel wall or mesenteric thickening. No evident bowel obstruction. No free air or portal venous air is evident. Vascular/Lymphatic: There is no abdominal aortic aneurysm. No vascular lesions are evident on this noncontrast enhanced study. Note that the attenuation throughout the  vascular structures appear slightly diminished which raises question of anemia. No adenopathy is appreciable in abdomen or pelvis. Reproductive: Uterus is anteverted. No pelvic masses evident. There is a slight degree of free fluid in the cul-de-sac region. Other: Appendix is not well seen. There is no periappendiceal region inflammation on this study. No abscess is seen in the abdomen or pelvis. There is no ascites beyond the slight fluid noted in the cul-de-sac region. Musculoskeletal: There are no blastic or lytic bone lesions. No intramuscular or abdominal wall lesions are appreciable. IMPRESSION: Comment: Note that paucity of fat makes assessment  in the abdomen and pelvis somewhat less than optimal. 1. Areas of cavitary pneumonia, likely with developing lung abscesses, in the right lower lobe. Areas of consolidation elsewhere with probable septic emboli associated. 2. Prominent liver and spleen. No focal liver and splenic lesions evident. 3. There is evidence of a degree of nephrocalcinosis. Amorphous calcification in a portion of the left renal pelvis and an inferior left calyx suggest developing staghorn calculus. No hydronephrosis on either side. No well-defined ureteral calculi. 4. Fluid in most loops of small bowel. Suspect a degree of ileus or enteritis. No bowel obstruction. No abscess evident in the abdomen or pelvis. 5. Small amount of free fluid in the cul-de-sac may be upper physiologic. Electronically Signed   By: Bretta BangWilliam  Woodruff III M.D.   On: 02/08/2018 14:47   Dg Chest 1 View  Result Date: 02/08/2018 CLINICAL DATA:  Hypotension and tachycardia. Smoking history. Asthma. EXAM: CHEST  1 VIEW COMPARISON:  None. FINDINGS: Heart size is within normal limits. Mild prominence of the LEFT hilum. Patchy opacities bilaterally, most prominent at the RIGHT lung base, majority of which have central lucencies suggest cavitary lesions. IMPRESSION: 1. Patchy opacities bilaterally, majority of which have central lucencies suggest cavitary masses/infections. Corresponding cavitary consolidations noted at the lung bases of a CT abdomen performed at the time of today's chest x-ray. Recommend CT chest with contrast for complete evaluation. 2. Questionable prominence of the LEFT hilum, additional cavitary mass/consolidation versus central bronchiectasis. Electronically Signed   By: Bary RichardStan  Maynard M.D.   On: 02/08/2018 14:48   Dg Chest 2 View  Result Date: 02/24/2018 CLINICAL DATA:  Short of breath EXAM: CHEST - 2 VIEW COMPARISON:  02/20/2018 FINDINGS: Upper normal heart size. Stable left pleural effusion. Tiny right pleural effusion is stable. Patchy  airspace opacities throughout both lungs are not significantly changed. No pneumothorax. IMPRESSION: Bilateral airspace disease and bilateral pleural effusions left greater than right are stable. Electronically Signed   By: Jolaine ClickArthur  Hoss M.D.   On: 02/24/2018 15:50   Mr Lumbar Spine W Wo Contrast  Result Date: 02/26/2018 CLINICAL DATA:  Low back pain since prior abnormal MRI 02/08/2018. Unable to walk. EXAM: MRI LUMBAR SPINE WITHOUT AND WITH CONTRAST TECHNIQUE: Multiplanar and multiecho pulse sequences of the lumbar spine were obtained without and with intravenous contrast. CONTRAST:  7 mL Gadavist COMPARISON:  02/08/2018 FINDINGS: Segmentation:  Standard. Alignment:  Physiologic. Vertebrae:  No fracture, evidence of discitis, or bone lesion. Conus medullaris and cauda equina: Conus extends to the L1 level. Conus and cauda equina appear normal. Paraspinal and other soft tissues: Posterior paraspinal muscle edema bilaterally with mild enhancement on postcontrast imaging. No intramuscular fluid collection or hematoma. Disc levels: Disc spaces: Disc spaces are maintained. T12-L1: No significant disc bulge. No evidence of neural foraminal stenosis. No central canal stenosis. L1-L2: No significant disc bulge. No evidence of neural foraminal stenosis. No central canal stenosis. L2-L3:  No significant disc bulge. No evidence of neural foraminal stenosis. No central canal stenosis. L3-L4: No significant disc bulge. No evidence of neural foraminal stenosis. No central canal stenosis. L4-L5: Minimal broad-based disc bulge. No evidence of neural foraminal stenosis. No central canal stenosis. L5-S1: Minimal broad-based disc bulge. No evidence of neural foraminal stenosis. No central canal stenosis. IMPRESSION: 1. No epidural fluid collection.  No discitis or osteomyelitis. 2. Posterior paraspinal muscle edema bilaterally with mild enhancement on postcontrast imaging. No intramuscular fluid collection or hematoma. Differential  considerations include muscle strain versus mild myositis. Electronically Signed   By: Elige Ko   On: 02/26/2018 09:53   Mr Lumbar Spine W Wo Contrast  Result Date: 02/08/2018 CLINICAL DATA:  Acute onset of back pain beginning 2 days ago. Study was ordered with contrast but the patient refused. EXAM: MRI LUMBAR SPINE WITHOUT CONTRAST TECHNIQUE: Multiplanar, multisequence MR imaging of the lumbar spine was performed. No intravenous contrast was administered. COMPARISON:  None. FINDINGS: Segmentation:  5 lumbar type vertebral bodies. Alignment:  Normal Vertebrae:  Normal Conus medullaris and cauda equina: Conus extends to the L1 level. Conus and cauda equina appear normal. Paraspinal and other soft tissues: There is lower paraspinous muscle edema as might be seen with a muscular strain. This appears symmetric from right to left. Disc levels: No abnormality at L3-4 or above. L4-5: Very minimal disc bulge.  No stenosis or neural compression. L5-S1: Very minimal disc bulge.  No stenosis or neural compression. IMPRESSION: Minimal disc bulges at L4-5 and L5-S1. No stenosis or neural compression. Paraspinous muscle edema on both sides in the lower lumbar region as might be seen with a muscular strain. This is nonspecific. The differential diagnosis does include infection, but the symmetric nature would be unusual. The patient refused contrast administration. Electronically Signed   By: Paulina Fusi M.D.   On: 02/08/2018 13:56   Dg Chest Port 1 View  Result Date: 03/01/2018 CLINICAL DATA:  Shortness of breath EXAM: PORTABLE CHEST 1 VIEW COMPARISON:  02/24/2018 FINDINGS: Cardiomegaly. Moderate to large bilateral pleural effusions, stable on the left, increasing on the right since prior study. Diffuse bilateral airspace disease has worsened since prior study and slightly more pronounced on the right. This could reflect asymmetric edema or pneumonia. IMPRESSION: Moderate to large bilateral pleural effusions,  increasing on the right since prior study. Diffuse bilateral airspace disease, right greater than left, worsening since prior study. This could reflect asymmetric edema or pneumonia. Electronically Signed   By: Charlett Nose M.D.   On: 03/01/2018 18:14   Dg Chest Port 1 View  Result Date: 02/20/2018 CLINICAL DATA:  Sepsis, endocarditis, cavitary pneumonia with septic emboli and fever. EXAM: PORTABLE CHEST 1 VIEW COMPARISON:  02/08/2018 FINDINGS: Multiple cavitary lesions again identified in both lungs with associated airspace disease in a pattern likely representing septic emboli with multiple areas of cavitary infection. There is a new left lateral pleural effusion which appears likely loculated and may represent an empyema. Further evaluation with CT of the chest with contrast may be helpful. The heart size is stable. IMPRESSION: Persistent appearance of bilateral cavitary pneumonia likely representing septic emboli. There is a new loculated left lateral pleural effusion that may represent a developing empyema. Further evaluation with CT of the chest with contrast may be helpful. Electronically Signed   By: Irish Lack M.D.   On: 02/20/2018 19:11   [place] Ekg Site Rite  Result Date: 02/26/2018 If [facility] image not attached, placement could not be  confirmed due to current cardiac rhythm.   Catarina Hartshorn, DO  [facility] Pager [phone]  If 7PM-7AM, please contact night-coverage [email or web address] Password [email or web address] 03/06/2018, 4:43 PM   LOS: 26 days

## 2018-03-07 LAB — BASIC METABOLIC PANEL
Anion gap: 8 (ref 5–15)
BUN: 8 mg/dL (ref 6–20)
CO2: 27 mmol/L (ref 22–32)
Calcium: 8.1 mg/dL — ABNORMAL LOW (ref 8.9–10.3)
Chloride: 96 mmol/L — ABNORMAL LOW (ref 98–111)
Creatinine, Ser: 0.52 mg/dL (ref 0.44–1.00)
GFR calc Af Amer: >60 mL/min (ref >60)
GFR calc non Af Amer: >60 mL/min (ref >60)
Glucose, Bld: 134 mg/dL — ABNORMAL HIGH (ref 70–99)
Potassium: 4.6 mmol/L (ref 3.5–5.1)
Sodium: 131 mmol/L — ABNORMAL LOW (ref 135–145)

## 2018-03-07 LAB — CBC
HCT: 28.8 % — ABNORMAL LOW (ref 36.0–46.0)
Hemoglobin: 8.4 g/dL — ABNORMAL LOW (ref 12.0–15.0)
MCH: 26.7 pg (ref 26.0–34.0)
MCHC: 29.2 g/dL — ABNORMAL LOW (ref 30.0–36.0)
MCV: 91.4 fL (ref 80.0–100.0)
Platelets: 374 10*3/uL (ref 150–400)
RBC: 3.15 MIL/uL — AB (ref 3.87–5.11)
RDW: 20.5 % — ABNORMAL HIGH (ref 11.5–15.5)
WBC: 16.3 10*3/uL — ABNORMAL HIGH (ref 4.0–10.5)
nRBC: 0.5 % — ABNORMAL HIGH (ref 0.0–0.2)

## 2018-03-07 LAB — MAGNESIUM: Magnesium: 1.7 mg/dL (ref 1.7–2.4)

## 2018-03-07 MED ORDER — PROCHLORPERAZINE EDISYLATE 10 MG/2ML IJ SOLN
10.0000 mg | Freq: Four times a day (QID) | INTRAMUSCULAR | Status: DC | PRN
  Administered 2018-03-07 – 2018-03-22 (×5): 10 mg via INTRAVENOUS
  Filled 2018-03-07 (×5): qty 2

## 2018-03-07 MED ORDER — TRAZODONE HCL 50 MG PO TABS
50.0000 mg | ORAL_TABLET | Freq: Every evening | ORAL | Status: AC | PRN
  Administered 2018-03-07: 50 mg via ORAL
  Filled 2018-03-07: qty 1

## 2018-03-07 MED ORDER — TRAZODONE HCL 50 MG PO TABS
50.0000 mg | ORAL_TABLET | Freq: Every evening | ORAL | Status: DC | PRN
  Administered 2018-03-07 – 2018-03-22 (×5): 50 mg via ORAL
  Filled 2018-03-07 (×6): qty 1

## 2018-03-07 NOTE — Progress Notes (Signed)
PT Cancellation Note  Patient Details Name: Amy Mcknight MRN: [id number] DOB: 12/07/1995   Cancelled Treatment:    Reason Eval/Treat Not Completed: Patient declined, no reason specified.  Patient refused therapy stating she is not feeling well even after much encouragement - RN notified.   1:23 PM, 03/07/18 Ocie Bob, MPT Physical Therapist with [facility] 336 [phone] office [phone] mobile phone

## 2018-03-07 NOTE — Progress Notes (Signed)
PROGRESS NOTE  Amy Mcknight ZOX:[id number] DOB: 21-Sep-1995 DOA: 02/08/2018 PCP: Patient, No Pcp Per  Brief History: 23 y.o.femalewith medical history significant forsubstance use disorderwho presents to the ED with 2 days of right lower back pain. Patient states symptoms began with acute onset while at rest. Symptoms have been persistent. She has noted associated diaphoresis, chills, shortness of breath, cough productive of brown sputum, and right chest wall pain. She denies any rashes or obvious skin changes. She reports good urine output without dysuria. She denies any abdominal pain, diarrhea, or constipation.  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 She continues to use recreational drugs by snorting nasally. She denies any alcohol use. She reports a history of withdrawal from opiates in the past.And is noted to be positive for marijuana and opiates on her UDS.  She is noted to be anemicand is status post 2 unit PRBC transfusion with improvement noted. No overt bleeding currently identified, but patient is complaining of some epigastric abdominal pain. As result of this pain, she has not been eating very much. She denies any nausea or vomiting.GI has evaluated patient with no need for endoscopy noted at this time. She intermittently refuses bathing and laboratory draws. She has been using a bedpan and has not been eating very well. She has been more somnolent from time to time possibly due to oversedation;  In the past week, pt was eating better and conversant and more compliant with medical therapy.  Lasix was started for fluid overload with +18kg since admission.  Repeat limited echo was obtained and showed some progression of her TR.  As a result cardiology was consulted to assist.  She is not a candidate for TV replacement/repair currently.  Assessment/Plan: Sepsis with early Septic shock -due toMSSA bacteremia, tricuspid  endocarditis, paravertebral myositis and cavitary pneumonia -sepsis pathophysiology and septic shock has resolved -Case discussedpreviouslywith infectious disease doctor -continue IV Ancef for total of 6 weeks from date of first negative culture which was 02/12/2018 in a supervised setting ([facility]) -2D echo positive for tricuspid vegetation and moderate regurgitation. 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 -remains afebrile and hemodynamically stable -continue Ancef.  Acute Diastolic CHF -pt +81 kg since admission -continuelasix 40 mg IV q12h -follow daily weights and strict I's and O's -personally reviewed CXR--increased bilateral pleural effusions and interstitial markings -03/05/18--repeat limited echo--more significant prolapse of TV with severe TR; LVEF 60-65% -daily weights--NEG 1.8 kg since starting lasix -appreciate cardiology consult. -educated about importance on watching sodium intake.  Hypomagnesemia -repleted And started on maintenance supplementation.   Hypokalemia -repleted and started on daily maintenance. -follow trend   AKI (acute kidney injury) (HCC) -resolved -serum creatinine peaked 2.61 -will follow BMET intermittently.  Substance use disorder/opiate withdrawal syndrome -she appears to be resting comfortably for extended/long periods of time without any evidence of pain, patient has been weaned off Dilaudid, -okay to continuePRN low-dose oxycodone continuemethocarbamol  4 times daily, Cymbalta 30 mg twice daily and gabapentin 300 mg 3 times daily -continue hydroxyzine 25 mg nightly -UDS on 02/24/2018 shows opiates --- this was done after there was  suspicion of possible inhalation/snorting of drugs in the room on the telemetry sitter camera -Dilaudid has been discontinued patient only has PRN  oxycodone 5 mg at this time -she is out of the window for active withdrawal symptoms and will plan on avoiding any extra opioids.  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  -patient continues to refuse to participate in ADLs often, wanting to have staff do more and more for her -complaints of back pain as of 02/25/2018, patient currently has MSSA bacteremia and back pain--- lumbar MRI from 02/26/2018 without any evidence of epidural abscess or significant inflammation , (prior lumbar MRI from 02/08/2018 was without contrast as patient refused contrast at that time ) -OOB with meals -PT intermittently.  Anemia of chronic disease -suspecthemolysis in the setting of acute infection-stable -Anemia panelrevealed some iron deficiency and stool occult is negative -2 unit PRBC orderedon 02/14/18--- Hgb remains stable post prior transfusion -Appreciate GI evaluation with no recommendations for endoscopy at this time -Continue p.o. PPI, --Ferahemegiven during this admission -3/1-given dose ferrelicit -continue following Hgb trend  Depression/Anxiety---- -stable -continue cymbalta -may use hydroxyzine for sleep and anxiety    Disposition Plan: Home on 03/26/18 if stable; continue IV antibiotics. Family Communication:NoFamily at bedside  Consultants:ID  Code Status: FULL   DVT Prophylaxis: SCDs   Procedures: As Listed in Progress Note Above  Antibiotics: Cefazolin 2/7>>> with anticipated last dose on  03/26/2018    Subjective: Afebrile, no abd pain, no hematochezia, no melena, no hematuria. Still with signs of fluid overload, using oxygen supplementation and complaining of chest discomfort and nausea.   Objective: Vitals:   03/07/18 0500 03/07/18 0820 03/07/18 1500 03/07/18 2028  BP:  (!) 132/108 (!) 121/101   Pulse:  (!) 116 (!) 108   Resp:   20   Temp:   98.5 F (36.9 C)   TempSrc:   Oral   SpO2:   98% 98%  Weight: 71.6 kg     Height:        Intake/Output Summary (Last 24 hours) at 03/07/2018 2057 Last data filed at 03/07/2018 1530 Gross per 24 hour  Intake 610.25 ml  Output 1 ml  Net 609.25 ml   Weight change: -0.5 kg   Exam: General exam: Alert, awake, oriented x 3; complaining of chest discomfort and nausea. Physical exam demonstrating fluid overload. Afebrile. Respiratory system: bibasilar rales appreciated; normal resp effort, no using accessory muscles.  Cardiovascular system: tachycardic, positive SEM, no rubs, no gallops.  Gastrointestinal system: Abdomen is nondistended, soft and nontender. No organomegaly or masses felt. Normal bowel sounds heard. Central nervous system: Alert and oriented. No focal neurological deficits. Extremities: No Cyanosis, no clubbing. Positive 2++ LE edema. Skin: No rashes, lesions or ulcers Psychiatry: Judgement and insight appear normal. Mood & affect appropriate.    Data Reviewed: I have personally reviewed following labs and imaging studies.  Basic Metabolic Panel: Recent Labs  Lab 03/02/18 0606 03/03/18 0716 03/04/18 0645 03/05/18 0635 03/06/18 0459 03/07/18 1818  NA 136 135 137 136 137 131*  K 3.7 3.6 3.4* 3.6 3.8 4.6  CL 103 102 102 100 100 96*  CO2 26 27 27 28 29 27   GLUCOSE 100* 94 92 93 97 134*  BUN 8 7 8 7 8 8   CREATININE 0.45 0.42* 0.49 0.43* 0.44 0.52  CALCIUM 8.0* 8.0* 8.0* 8.3* 8.2* 8.1*  MG 1.3* 1.6* 1.4*  --  1.5* 1.7  PHOS 3.8  --   --   --   --   --  Liver Function Tests: Recent Labs  Lab  03/02/18 0606 03/05/18 0635  AST 45* 29  ALT 15 10  ALKPHOS 111 117  BILITOT 0.2* 0.2*  PROT 6.4* 6.6  ALBUMIN 1.6* 1.7*   CBC: Recent Labs  Lab 03/02/18 0606 03/04/18 0645 03/05/18 0635 03/07/18 1818  WBC 12.8* 12.2* 12.6* 16.3*  NEUTROABS 7.6  --   --   --   HGB 7.9* 7.6* 8.0* 8.4*  HCT 27.9* 26.2* 28.1* 28.8*  MCV 90.6 91.0 91.8 91.4  PLT 388 352 390 374   Cardiac Enzymes: Recent Labs  Lab 03/02/18 0606  CKTOTAL 14*   Urine analysis:    Component Value Date/Time   COLORURINE YELLOW 02/24/2018 1845   APPEARANCEUR HAZY (A) 02/24/2018 1845   LABSPEC 1.020 02/24/2018 1845   PHURINE 5.0 02/24/2018 1845   GLUCOSEU NEGATIVE 02/24/2018 1845   HGBUR MODERATE (A) 02/24/2018 1845   BILIRUBINUR NEGATIVE 02/24/2018 1845   KETONESUR NEGATIVE 02/24/2018 1845   PROTEINUR 30 (A) 02/24/2018 1845   NITRITE NEGATIVE 02/24/2018 1845   LEUKOCYTESUR SMALL (A) 02/24/2018 1845    Scheduled Meds: . diclofenac  1 patch Transdermal BID  . DULoxetine  30 mg Oral BID  . furosemide  40 mg Intravenous BID  . gabapentin  300 mg Oral TID  . hydrOXYzine  25 mg Oral QHS  . magnesium oxide  400 mg Oral BID  . methocarbamol  750 mg Oral QID  . metoprolol tartrate  75 mg Oral BID  . multivitamin with minerals  1 tablet Oral Daily  . pantoprazole  40 mg Oral BID  . potassium chloride  20 mEq Oral Daily  . senna-docusate  2 tablet Oral BID  . sodium chloride flush  10-40 mL Intracatheter Q12H  . sodium chloride flush  3 mL Intravenous Q12H   Continuous Infusions: .  ceFAZolin (ANCEF) IV 2 g (03/07/18 1352)    Procedures/Studies: Ct Abdomen Pelvis Wo Contrast  Result Date: 02/08/2018 CLINICAL DATA:  Abdominal pain and fever EXAM: CT ABDOMEN AND PELVIS WITHOUT CONTRAST TECHNIQUE: Multidetector CT imaging of the abdomen and pelvis was performed following the standard protocol without oral or IV contrast. COMPARISON:  None. FINDINGS: Lower chest: There is airspace consolidation throughout  the lung bases with several areas of cavitation, likely developing lung abscesses. Several nodular opacities are noted associated with these areas of cavitary consolidation, likely septic emboli. Hepatobiliary: Liver measures 20.5 cm in length. No focal liver lesions are appreciable on this noncontrast enhanced study. The gallbladder is borderline dilated without wall thickening. There is no biliary duct dilatation. Pancreas: No pancreatic mass or inflammatory focus. Spleen: Spleen measures 14.1 x 10.9 x 5.1 cm with a measured splenic volume of 392 cubic cm. No focal splenic lesions are evident. Adrenals/Urinary Tract: Adrenals appear unremarkable bilaterally. 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 No renal mass evident. No hydronephrosis on either side. There is a suspected developing staghorn calculus on the left with somewhat amorphous calcification involving a lower pole calyx on the left extending into the left renal pelvis, best appreciated on coronal imaging. This developing staghorn calculus measures 3.7 x 1.0 cm. No ureteral calculi are evident. Urinary bladder is decompressed. No urinary bladder wall thickening is appreciable with essentially empty bladder. Stomach/Bowel: There is fluid throughout most small bowel loops. There is no appreciable bowel wall or mesenteric thickening. No evident bowel obstruction. No free air or portal venous air is evident. Vascular/Lymphatic: There is no abdominal aortic aneurysm. No vascular lesions are evident on this  noncontrast enhanced study. Note that the attenuation throughout the vascular structures appear slightly diminished which raises question of anemia. No adenopathy is appreciable in abdomen or pelvis. Reproductive: Uterus is anteverted. No pelvic masses evident. There is a slight degree of free fluid in the cul-de-sac region. Other: Appendix is not well seen. There is no periappendiceal region inflammation on this study. No abscess is seen in the abdomen  or pelvis. There is no ascites beyond the slight fluid noted in the cul-de-sac region. Musculoskeletal: There are no blastic or lytic bone lesions. No intramuscular or abdominal wall lesions are appreciable. IMPRESSION: Comment: Note that paucity of fat makes assessment in the abdomen and pelvis somewhat less than optimal. 1. Areas of cavitary pneumonia, likely with developing lung abscesses, in the right lower lobe. Areas of consolidation elsewhere with probable septic emboli associated. 2. Prominent liver and spleen. No focal liver and splenic lesions evident. 3. There is evidence of a degree of nephrocalcinosis. Amorphous calcification in a portion of the left renal pelvis and an inferior left calyx suggest developing staghorn calculus. No hydronephrosis on either side. No well-defined ureteral calculi. 4. Fluid in most loops of small bowel. Suspect a degree of ileus or enteritis. No bowel obstruction. No abscess evident in the abdomen or pelvis. 5. Small amount of free fluid in the cul-de-sac may be upper physiologic. Electronically Signed   By: Bretta Bang III M.D.   On: 02/08/2018 14:47   Dg Chest 1 View  Result Date: 02/08/2018 CLINICAL DATA:  Hypotension and tachycardia. Smoking history. Asthma. EXAM: CHEST  1 VIEW COMPARISON:  None. FINDINGS: Heart size is within normal limits. Mild prominence of the LEFT hilum. Patchy opacities bilaterally, most prominent at the RIGHT lung base, majority of which have central lucencies suggest cavitary lesions. IMPRESSION: 1. Patchy opacities bilaterally, majority of which have central lucencies suggest cavitary masses/infections. Corresponding cavitary consolidations noted at the lung bases of a CT abdomen performed at the time of today's chest x-ray. Recommend CT chest with contrast for complete evaluation. 2. Questionable prominence of the LEFT hilum, additional cavitary mass/consolidation versus central bronchiectasis. Electronically Signed   By: Bary Richard  M.D.   On: 02/08/2018 14:48   Dg Chest 2 View  Result Date: 02/24/2018 CLINICAL DATA:  Short of breath EXAM: CHEST - 2 VIEW COMPARISON:  02/20/2018 FINDINGS: Upper normal heart size. Stable left pleural effusion. Tiny right pleural effusion is stable. Patchy airspace opacities throughout both lungs are not significantly changed. No pneumothorax. IMPRESSION: Bilateral airspace disease and bilateral pleural effusions left greater than right are stable. Electronically Signed   By: Jolaine Click M.D.   On: 02/24/2018 15:50   Mr Lumbar Spine W Wo Contrast  Result Date: 02/26/2018 CLINICAL DATA:  Low back pain since prior abnormal MRI 02/08/2018. Unable to walk. EXAM: MRI LUMBAR SPINE WITHOUT AND WITH CONTRAST TECHNIQUE: Multiplanar and multiecho pulse sequences of the lumbar spine were obtained without and with intravenous contrast. CONTRAST:  7 mL Gadavist COMPARISON:  02/08/2018 FINDINGS: Segmentation:  Standard. Alignment:  Physiologic. Vertebrae:  No fracture, evidence of discitis, or bone lesion. Conus medullaris and cauda equina: Conus extends to the L1 level. Conus and cauda equina appear normal. Paraspinal and other soft tissues: Posterior paraspinal muscle edema bilaterally with mild enhancement on postcontrast imaging. No intramuscular fluid collection or hematoma. Disc levels: Disc spaces: Disc spaces are maintained. T12-L1: No significant disc bulge. No evidence of neural foraminal stenosis. No central canal stenosis. L1-L2: No significant disc bulge. No evidence  of neural foraminal stenosis. No central canal stenosis. L2-L3: No significant disc bulge. No evidence of neural foraminal stenosis. No central canal stenosis. L3-L4: No significant disc bulge. No evidence of neural foraminal stenosis. No central canal stenosis. L4-L5: Minimal broad-based disc bulge. No evidence of neural foraminal stenosis. No central canal stenosis. L5-S1: Minimal broad-based disc bulge. No evidence of neural foraminal  stenosis. No central canal stenosis. IMPRESSION: 1. No epidural fluid collection.  No discitis or osteomyelitis. 2. Posterior paraspinal muscle edema bilaterally with mild enhancement on postcontrast imaging. No intramuscular fluid collection or hematoma. Differential considerations include muscle strain versus mild myositis. Electronically Signed   By: Elige Ko   On: 02/26/2018 09:53   Mr Lumbar Spine W Wo Contrast  Result Date: 02/08/2018 CLINICAL DATA:  Acute onset of back pain beginning 2 days ago. Study was ordered with contrast but the patient refused. EXAM: MRI LUMBAR SPINE WITHOUT CONTRAST TECHNIQUE: Multiplanar, multisequence MR imaging of the lumbar spine was performed. No intravenous contrast was administered. COMPARISON:  None. FINDINGS: Segmentation:  5 lumbar type vertebral bodies. Alignment:  Normal Vertebrae:  Normal Conus medullaris and cauda equina: Conus extends to the L1 level. Conus and cauda equina appear normal. Paraspinal and other soft tissues: There is lower paraspinous muscle edema as might be seen with a muscular strain. This appears symmetric from right to left. Disc levels: No abnormality at L3-4 or above. L4-5: Very minimal disc bulge.  No stenosis or neural compression. L5-S1: Very minimal disc bulge.  No stenosis or neural compression. IMPRESSION: Minimal disc bulges at L4-5 and L5-S1. No stenosis or neural compression. Paraspinous muscle edema on both sides in the lower lumbar region as might be seen with a muscular strain. This is nonspecific. The differential diagnosis does include infection, but the symmetric nature would be unusual. The patient refused contrast administration. Electronically Signed   By: Paulina Fusi M.D.   On: 02/08/2018 13:56   Dg Chest Port 1 View  Result Date: 03/01/2018 CLINICAL DATA:  Shortness of breath EXAM: PORTABLE CHEST 1 VIEW COMPARISON:  02/24/2018 FINDINGS: Cardiomegaly. Moderate to large bilateral pleural effusions, stable on the left,  increasing on the right since prior study. Diffuse bilateral airspace disease has worsened since prior study and slightly more pronounced on the right. This could reflect asymmetric edema or pneumonia. IMPRESSION: Moderate to large bilateral pleural effusions, increasing on the right since prior study. Diffuse bilateral airspace disease, right greater than left, worsening since prior study. This could reflect asymmetric edema or pneumonia. Electronically Signed   By: Charlett Nose M.D.   On: 03/01/2018 18:14   Dg Chest Port 1 View  Result Date: 02/20/2018 CLINICAL DATA:  Sepsis, endocarditis, cavitary pneumonia with septic emboli and fever. EXAM: PORTABLE CHEST 1 VIEW COMPARISON:  02/08/2018 FINDINGS: Multiple cavitary lesions again identified in both lungs with associated airspace disease in a pattern likely representing septic emboli with multiple areas of cavitary infection. There is a new left lateral pleural effusion which appears likely loculated and may represent an empyema. Further evaluation with CT of the chest with contrast may be helpful. The heart size is stable. IMPRESSION: Persistent appearance of bilateral cavitary pneumonia likely representing septic emboli. There is a new loculated left lateral pleural effusion that may represent a developing empyema. Further evaluation with CT of the chest with contrast may be helpful. Electronically Signed   By: Irish Lack M.D.   On: 02/20/2018 19:11   [place] Ekg Site Rite  Result Date: 02/26/2018 If  Site Rite image not attached, placement could not be confirmed due to current cardiac rhythm.   Vassie Loll, MD [facility] Pager [phone]  03/07/2018, 8:57 PM   LOS: 27 days

## 2018-03-08 MED ORDER — FUROSEMIDE 10 MG/ML IJ SOLN
60.0000 mg | Freq: Two times a day (BID) | INTRAMUSCULAR | Status: DC
  Administered 2018-03-08 – 2018-03-11 (×3): 60 mg via INTRAVENOUS
  Filled 2018-03-08 (×9): qty 6

## 2018-03-08 NOTE — Progress Notes (Signed)
PROGRESS NOTE  Amy Mcknight ZOX:[id number] DOB: 12-12-1995 DOA: 02/08/2018 PCP: Patient, No Pcp Per  Brief History: 23 y.o.femalewith medical history significant forsubstance use disorderwho presents to the ED with 2 days of right lower back pain. Patient states symptoms began with acute onset while at rest. Symptoms have been persistent. She has noted associated diaphoresis, chills, shortness of breath, cough productive of brown sputum, and right chest wall pain. She denies any rashes or obvious skin changes. She reports good urine output without dysuria. She denies any abdominal pain, diarrhea, or constipation.  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 She continues to use recreational drugs by snorting nasally. She denies any alcohol use. She reports a history of withdrawal from opiates in the past.And is noted to be positive for marijuana and opiates on her UDS.  She is noted to be anemicand is status post 2 unit PRBC transfusion with improvement noted. No overt bleeding currently identified, but patient is complaining of some epigastric abdominal pain. As result of this pain, she has not been eating very much. She denies any nausea or vomiting.GI has evaluated patient with no need for endoscopy noted at this time. She intermittently refuses bathing and laboratory draws. She has been using a bedpan and has not been eating very well. She has been more somnolent from time to time possibly due to oversedation;  In the past week, pt was eating better and conversant and more compliant with medical therapy.  Lasix was started for fluid overload with +18kg since admission.  Repeat limited echo was obtained and showed some progression of her TR.  As a result cardiology was consulted to assist.  She is not a candidate for TV replacement/repair currently.  Assessment/Plan: Sepsis with early Septic shock -due toMSSA bacteremia, tricuspid  endocarditis, paravertebral myositis and cavitary pneumonia -sepsis pathophysiology and septic shock has resolved -Case discussedpreviouslywith infectious disease doctor -continue IV Ancef for total of 6 weeks from date of first negative culture which was 02/12/2018 in a supervised setting ([facility]) -2D echo positive for tricuspid vegetation and moderate regurgitation. 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 -remains afebrile and hemodynamically stable -continue Ancef.  Acute Diastolic CHF -pt +81 kg since admission -continueIV Lasix -follow daily weights and strict I's and O's -repeat CXR in 2 days. -03/05/18--repeat limited echo--more significant prolapse of TV with severe TR; LVEF 60-65% -daily weights--NEG 1.8 kg since starting lasix; Lasix dose has been adjusted to 60 mg every 12 hours and patient's diet change to heart healthy diet with fluid restriction. -appreciate cardiology consult. -educated about importance on watching sodium intake.  Hypomagnesemia -repleted And started on maintenance supplementation.   Hypokalemia -repleted and started on daily maintenance. -follow trend   AKI (acute kidney injury) (HCC) -resolved -serum creatinine peaked 2.61 -will follow BMET intermittently; especially now that we are addressing fluid overload with IV diuresis..  Substance use disorder/opiate withdrawal syndrome -she appears to be resting comfortably for extended/long periods of time without any evidence of pain, patient has been weaned off Dilaudid, -okay to continuePRN low-dose oxycodone continuemethocarbamol  4 times daily, Cymbalta 30 mg twice daily and gabapentin 300 mg 3  times daily -continue hydroxyzine 25 mg nightly -UDS on 02/24/2018 shows opiates --- this was  done after there was suspicion of possible inhalation/snorting of drugs in the room on the telemetry sitter camera -Dilaudid has been discontinued patient only has PRN oxycodone 5 mg at this time -she is out of the window for active withdrawal symptoms and will plan on avoiding any extra opioids.  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  -patient continues to refuse to participate in ADLs often, wanting to have staff do more and more for her -complaints of back pain as of 02/25/2018, patient currently has MSSA bacteremia and back pain--- lumbar MRI from 02/26/2018 without any evidence of epidural abscess or significant inflammation , (prior lumbar MRI from 02/08/2018 was without contrast as patient refused contrast at that time ) -OOB with meals -PT intermittently.  Anemia of chronic disease -suspecthemolysis in the setting of acute infection-stable -Anemia panelrevealed some iron deficiency and stool occult is negative -2 unit PRBC orderedon 02/14/18--- Hgb remains stable post prior transfusion -Appreciate GI evaluation with no recommendations for endoscopy at this time -Continue p.o. PPI, --Ferahemegiven during this admission -3/1-given dose ferrelicit -continue following Hgb trend  Depression/Anxiety---- -stable -continue cymbalta -may use hydroxyzine for sleep and anxiety    Disposition Plan: Home on 03/26/18 if stable; continue IV antibiotics. Family Communication:NoFamily at bedside  Consultants:ID  Code Status: FULL   DVT Prophylaxis: SCDs   Procedures: As Listed in  Progress Note Above  Antibiotics: Cefazolin 2/7>>> with anticipated last dose on 03/26/2018    Subjective: No fever.  No abdominal pain, no hematochezia, no melena, no hematuria or dysuria.  Patient still with signs of fluid overload, using 2 L nasal cannula supplementation and intermittently complaining of nausea, chest discomfort and back pain.   Objective: Vitals:   03/07/18 2028 03/07/18 2146 03/08/18 0500 03/08/18 0538  BP:  123/87  (!) 129/106  Pulse:  (!) 117  (!) 102  Resp:  20  18  Temp:  98 F (36.7 C)  97.6 F (36.4 C)  TempSrc:  Oral  Oral  SpO2: 98% 97%  100%  Weight:   71.8 kg   Height:        Intake/Output Summary (Last 24 hours) at 03/08/2018 0413 Last data filed at 03/08/2018 0600 Gross per 24 hour  Intake 1019.95 ml  Output 401 ml  Net 618.95 ml   Weight change: 0.2 kg   Exam: General exam: Alert, awake, oriented x 3; continue reporting intermittent episode of nausea, chest discomfort and back pain.  Physical exam demonstrating fluid overload.  Patient without any fever. Respiratory system: Positive bibasilar rales appreciated on exam; no using accessory muscles.  2 L nasal cannula supplementation in place.   Cardiovascular system: Mild sinus tachycardia, no rubs, no gallops.  Positive systolic ejection murmur appreciated on exam.   Gastrointestinal system: Abdomen is nondistended, soft and nontender. No organomegaly or masses felt. Normal bowel sounds heard. Central nervous system: Alert and oriented. No focal neurological deficits. Extremities: No cyanosis or clubbing; 2+ edema bilaterally. Skin: No rashes, lesions or ulcers Psychiatry: Judgement and insight appear normal. Mood & affect appropriate.   Data Reviewed: I have personally reviewed following labs and imaging studies.  Basic Metabolic Panel: Recent Labs  Lab 03/02/18 0606 03/03/18 0716 03/04/18 0645 03/05/18 0635 03/06/18 0459 03/07/18 1818  NA 136 135 137 136 137 131*  K 3.7  3.6  3.4* 3.6 3.8 4.6  CL 103 102 102 100 100 96*  CO2 GLUCOSE 100* 94 92 93 97 134*  BUN CREATININE 0.45 0.42* 0.49 0.43* 0.44 0.52  CALCIUM 8.0* 8.0* 8.0* 8.3* 8.2* 8.1*  MG 1.3* 1.6* 1.4*  --  1.5* 1.7  PHOS 3.8  --   --   --   --   --    Liver Function Tests: Recent Labs  Lab 03/02/18 0606 03/05/18 0635  AST 45* 29  ALT 15 10  ALKPHOS 111 117  BILITOT 0.2* 0.2*  PROT 6.4* 6.6  ALBUMIN 1.6* 1.7*   CBC: Recent Labs  Lab 03/02/18 0606 03/04/18 0645 03/05/18 0635 03/07/18 1818  WBC 12.8* 12.2* 12.6* 16.3*  NEUTROABS 7.6  --   --   --   HGB 7.9* 7.6* 8.0* 8.4*  HCT 27.9* 26.2* 28.1* 28.8*  MCV 90.6 91.0 91.8 91.4  PLT 388 352 390 374   Cardiac Enzymes: Recent Labs  Lab 03/02/18 0606  CKTOTAL 14*   Urine analysis:    Component Value Date/Time   COLORURINE YELLOW 02/24/2018 1845   APPEARANCEUR HAZY (A) 02/24/2018 1845   LABSPEC 1.020 02/24/2018 1845   PHURINE 5.0 02/24/2018 1845   GLUCOSEU NEGATIVE 02/24/2018 1845   HGBUR MODERATE (A) 02/24/2018 1845   BILIRUBINUR NEGATIVE 02/24/2018 1845   KETONESUR NEGATIVE 02/24/2018 1845   PROTEINUR 30 (A) 02/24/2018 1845   NITRITE NEGATIVE 02/24/2018 1845   LEUKOCYTESUR SMALL (A) 02/24/2018 1845    Scheduled Meds: . diclofenac  1 patch Transdermal BID  . DULoxetine  30 mg Oral BID  . furosemide  40 mg Intravenous BID  . gabapentin  300 mg Oral TID  . hydrOXYzine  25 mg Oral QHS  . magnesium oxide  400 mg Oral BID  . methocarbamol  750 mg Oral QID  . metoprolol tartrate  75 mg Oral BID  . multivitamin with minerals  1 tablet Oral Daily  . pantoprazole  40 mg Oral BID  . potassium chloride  20 mEq Oral Daily  . senna-docusate  2 tablet Oral BID  . sodium chloride flush  10-40 mL Intracatheter Q12H  . sodium chloride flush  3 mL Intravenous Q12H   Continuous Infusions: .  ceFAZolin (ANCEF) IV 2 g (03/08/18 0540)    Procedures/Studies: Ct Abdomen Pelvis Wo Contrast  Result Date:  02/08/2018 CLINICAL DATA:  Abdominal pain and fever EXAM: CT ABDOMEN AND PELVIS WITHOUT CONTRAST TECHNIQUE: Multidetector CT imaging of the abdomen and pelvis was performed following the standard protocol without oral or IV contrast. COMPARISON:  None. FINDINGS: Lower chest: There is airspace consolidation throughout the lung bases with several areas of cavitation, likely developing lung abscesses. Several nodular opacities are noted associated with these areas of cavitary consolidation, likely septic emboli. Hepatobiliary: Liver measures 20.5 cm in length. No focal liver lesions are appreciable on this noncontrast enhanced study. The gallbladder is borderline dilated without wall thickening. There is no biliary duct dilatation. Pancreas: No pancreatic mass or inflammatory focus. Spleen: Spleen measures 14.1 x 10.9 x 5.1 cm with a measured splenic volume of 392 cubic cm. No focal splenic lesions are evident. Adrenals/Urinary Tract: Adrenals appear unremarkable bilaterally. 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 No renal mass evident. No hydronephrosis on either side. There is a suspected developing staghorn calculus on the left with somewhat amorphous calcification involving a lower pole calyx on the left extending into  the left renal pelvis, best appreciated on coronal imaging. This developing staghorn calculus measures 3.7 x 1.0 cm. No ureteral calculi are evident. Urinary bladder is decompressed. No urinary bladder wall thickening is appreciable with essentially empty bladder. Stomach/Bowel: There is fluid throughout most small bowel loops. There is no appreciable bowel wall or mesenteric thickening. No evident bowel obstruction. No free air or portal venous air is evident. Vascular/Lymphatic: There is no abdominal aortic aneurysm. No vascular lesions are evident on this noncontrast enhanced study. Note that the attenuation throughout the vascular structures appear slightly diminished which raises question of  anemia. No adenopathy is appreciable in abdomen or pelvis. Reproductive: Uterus is anteverted. No pelvic masses evident. There is a slight degree of free fluid in the cul-de-sac region. Other: Appendix is not well seen. There is no periappendiceal region inflammation on this study. No abscess is seen in the abdomen or pelvis. There is no ascites beyond the slight fluid noted in the cul-de-sac region. Musculoskeletal: There are no blastic or lytic bone lesions. No intramuscular or abdominal wall lesions are appreciable. IMPRESSION: Comment: Note that paucity of fat makes assessment in the abdomen and pelvis somewhat less than optimal. 1. Areas of cavitary pneumonia, likely with developing lung abscesses, in the right lower lobe. Areas of consolidation elsewhere with probable septic emboli associated. 2. Prominent liver and spleen. No focal liver and splenic lesions evident. 3. There is evidence of a degree of nephrocalcinosis. Amorphous calcification in a portion of the left renal pelvis and an inferior left calyx suggest developing staghorn calculus. No hydronephrosis on either side. No well-defined ureteral calculi. 4. Fluid in most loops of small bowel. Suspect a degree of ileus or enteritis. No bowel obstruction. No abscess evident in the abdomen or pelvis. 5. Small amount of free fluid in the cul-de-sac may be upper physiologic. Electronically Signed   By: Bretta Bang III M.D.   On: 02/08/2018 14:47   Dg Chest 1 View  Result Date: 02/08/2018 CLINICAL DATA:  Hypotension and tachycardia. Smoking history. Asthma. EXAM: CHEST  1 VIEW COMPARISON:  None. FINDINGS: Heart size is within normal limits. Mild prominence of the LEFT hilum. Patchy opacities bilaterally, most prominent at the RIGHT lung base, majority of which have central lucencies suggest cavitary lesions. IMPRESSION: 1. Patchy opacities bilaterally, majority of which have central lucencies suggest cavitary masses/infections. Corresponding cavitary  consolidations noted at the lung bases of a CT abdomen performed at the time of today's chest x-ray. Recommend CT chest with contrast for complete evaluation. 2. Questionable prominence of the LEFT hilum, additional cavitary mass/consolidation versus central bronchiectasis. Electronically Signed   By: Bary Richard M.D.   On: 02/08/2018 14:48   Dg Chest 2 View  Result Date: 02/24/2018 CLINICAL DATA:  Short of breath EXAM: CHEST - 2 VIEW COMPARISON:  02/20/2018 FINDINGS: Upper normal heart size. Stable left pleural effusion. Tiny right pleural effusion is stable. Patchy airspace opacities throughout both lungs are not significantly changed. No pneumothorax. IMPRESSION: Bilateral airspace disease and bilateral pleural effusions left greater than right are stable. Electronically Signed   By: Jolaine Click M.D.   On: 02/24/2018 15:50   Mr Lumbar Spine W Wo Contrast  Result Date: 02/26/2018 CLINICAL DATA:  Low back pain since prior abnormal MRI 02/08/2018. Unable to walk. EXAM: MRI LUMBAR SPINE WITHOUT AND WITH CONTRAST TECHNIQUE: Multiplanar and multiecho pulse sequences of the lumbar spine were obtained without and with intravenous contrast. CONTRAST:  7 mL Gadavist COMPARISON:  02/08/2018 FINDINGS: Segmentation:  Standard. Alignment:  Physiologic. Vertebrae:  No fracture, evidence of discitis, or bone lesion. Conus medullaris and cauda equina: Conus extends to the L1 level. Conus and cauda equina appear normal. Paraspinal and other soft tissues: Posterior paraspinal muscle edema bilaterally with mild enhancement on postcontrast imaging. No intramuscular fluid collection or hematoma. Disc levels: Disc spaces: Disc spaces are maintained. T12-L1: No significant disc bulge. No evidence of neural foraminal stenosis. No central canal stenosis. L1-L2: No significant disc bulge. No evidence of neural foraminal stenosis. No central canal stenosis. L2-L3: No significant disc bulge. No evidence of neural foraminal  stenosis. No central canal stenosis. L3-L4: No significant disc bulge. No evidence of neural foraminal stenosis. No central canal stenosis. L4-L5: Minimal broad-based disc bulge. No evidence of neural foraminal stenosis. No central canal stenosis. L5-S1: Minimal broad-based disc bulge. No evidence of neural foraminal stenosis. No central canal stenosis. IMPRESSION: 1. No epidural fluid collection.  No discitis or osteomyelitis. 2. Posterior paraspinal muscle edema bilaterally with mild enhancement on postcontrast imaging. No intramuscular fluid collection or hematoma. Differential considerations include muscle strain versus mild myositis. Electronically Signed   By: Elige Ko   On: 02/26/2018 09:53   Mr Lumbar Spine W Wo Contrast  Result Date: 02/08/2018 CLINICAL DATA:  Acute onset of back pain beginning 2 days ago. Study was ordered with contrast but the patient refused. EXAM: MRI LUMBAR SPINE WITHOUT CONTRAST TECHNIQUE: Multiplanar, multisequence MR imaging of the lumbar spine was performed. No intravenous contrast was administered. COMPARISON:  None. FINDINGS: Segmentation:  5 lumbar type vertebral bodies. Alignment:  Normal Vertebrae:  Normal Conus medullaris and cauda equina: Conus extends to the L1 level. Conus and cauda equina appear normal. Paraspinal and other soft tissues: There is lower paraspinous muscle edema as might be seen with a muscular strain. This appears symmetric from right to left. Disc levels: No abnormality at L3-4 or above. L4-5: Very minimal disc bulge.  No stenosis or neural compression. L5-S1: Very minimal disc bulge.  No stenosis or neural compression. IMPRESSION: Minimal disc bulges at L4-5 and L5-S1. No stenosis or neural compression. Paraspinous muscle edema on both sides in the lower lumbar region as might be seen with a muscular strain. This is nonspecific. The differential diagnosis does include infection, but the symmetric nature would be unusual. The patient refused  contrast administration. Electronically Signed   By: Paulina Fusi M.D.   On: 02/08/2018 13:56   Dg Chest Port 1 View  Result Date: 03/01/2018 CLINICAL DATA:  Shortness of breath EXAM: PORTABLE CHEST 1 VIEW COMPARISON:  02/24/2018 FINDINGS: Cardiomegaly. Moderate to large bilateral pleural effusions, stable on the left, increasing on the right since prior study. Diffuse bilateral airspace disease has worsened since prior study and slightly more pronounced on the right. This could reflect asymmetric edema or pneumonia. IMPRESSION: Moderate to large bilateral pleural effusions, increasing on the right since prior study. Diffuse bilateral airspace disease, right greater than left, worsening since prior study. This could reflect asymmetric edema or pneumonia. Electronically Signed   By: Charlett Nose M.D.   On: 03/01/2018 18:14   Dg Chest Port 1 View  Result Date: 02/20/2018 CLINICAL DATA:  Sepsis, endocarditis, cavitary pneumonia with septic emboli and fever. EXAM: PORTABLE CHEST 1 VIEW COMPARISON:  02/08/2018 FINDINGS: Multiple cavitary lesions again identified in both lungs with associated airspace disease in a pattern likely representing septic emboli with multiple areas of cavitary infection. There is a new left lateral pleural effusion which appears likely loculated and may  represent an empyema. Further evaluation with CT of the chest with contrast may be helpful. The heart size is stable. IMPRESSION: Persistent appearance of bilateral cavitary pneumonia likely representing septic emboli. There is a new loculated left lateral pleural effusion that may represent a developing empyema. Further evaluation with CT of the chest with contrast may be helpful. Electronically Signed   By: Irish Lack M.D.   On: 02/20/2018 19:11   [place] Ekg Site Rite  Result Date: 02/26/2018 If Site Rite image not attached, placement could not be confirmed due to current cardiac rhythm.   Vassie Loll, MD [facility] Pager [phone]  03/08/2018, 8:12 AM   LOS: 28 days

## 2018-03-08 NOTE — Progress Notes (Signed)
Physical Therapy Treatment Patient Details Name: Amy Mcknight MRN: [id number] DOB: 03-17-1995 Today's Date: 03/08/2018    History of Present Illness Amy Mcknight is a 23 y.o. female with medical history significant for substance use disorder who presents to the ED with 2 days of right lower back pain.  Patient states symptoms began with acute onset while at rest.  Symptoms have been persistent.  She has noted associated diaphoresis, chills, shortness of breath, cough productive of brown sputum, and right chest wall pain.  She denies any rashes or obvious skin changes.  She reports good urine output without dysuria.  She denies any abdominal pain, diarrhea, or constipation. 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  She continues to use recreational drugs by snorting nasally.  She denies any alcohol use.  She reports a history of withdrawal from opiates in the past.    PT Comments    Patient cooperative after encouragement and able to complete BLE ROM/strengthening exercises while seated at bedside without LOB using BUE for support, limited to a few slow unsteady steps at bedside during transfer to chair and declined to attempt ambulation out of room due to c/o fatigue, RLE pain and generalized weakness.  Patient tolerated staying up in chair to eat lunch after therapy - nursing staff aware.  Patient will benefit from continued physical therapy in hospital and recommended venue below to increase strength, balance, endurance for safe ADLs and gait.   Follow Up Recommendations  Supervision/Assistance - 24 hour;Supervision for mobility/OOB;SNF     Equipment Recommendations  Rolling walker with 5" wheels    Recommendations for Other Services       Precautions / Restrictions Precautions Precautions: Fall Precaution Comments: monitor O2 saturation Restrictions Weight Bearing Restrictions: No    Mobility  Bed Mobility Overal bed mobility: Modified Independent Bed Mobility:  Supine to Sit     Supine to sit: Supervision     General bed mobility comments: slow labored movement   Transfers Overall transfer level: Needs assistance Equipment used: Rolling walker (2 wheeled) Transfers: Sit to/from [employer] Sit to Stand: Min assist Stand pivot transfers: Min assist;Mod assist       General transfer comment: difficulty making turns using RW due to RLE pain, generalized weakness  Ambulation/Gait Ambulation/Gait assistance: Min assist;Mod assist Gait Distance (Feet): 3 Feet Assistive device: Rolling walker (2 wheeled) Gait Pattern/deviations: Decreased step length - right;Decreased step length - left;Decreased stride length Gait velocity: slow   General Gait Details: limited to 4-5 slow labored unsteady steps, limited secondary to c/o RLE pain, fatigue   Stairs             Wheelchair Mobility    Modified Rankin (Stroke Patients Only)       Balance Overall balance assessment: Needs assistance Sitting-balance support: Feet supported;Bilateral upper extremity supported Sitting balance-Leahy Scale: Good Sitting balance - Comments: seated edge of bed   Standing balance support: During functional activity;Bilateral upper extremity supported Standing balance-Leahy Scale: Fair Standing balance comment: using RW                            Cognition Arousal/Alertness: Awake/alert Behavior During Therapy: WFL for tasks assessed/performed Overall Cognitive Status: Within Functional Limits for tasks assessed                                 General Comments:  slightly agitated, more cooperative after starting exercises      Exercises General Exercises - Lower Extremity Long Arc Quad: Seated;Strengthening;AROM;Both;10 reps Hip Flexion/Marching: Seated;Strengthening;AROM;Both;10 reps Toe Raises: Seated;Strengthening;AROM;Both;10 reps Heel Raises: Seated;Strengthening;AROM;Both;10 reps    General  Comments        Pertinent Vitals/Pain Pain Assessment: Faces Faces Pain Scale: Hurts even more Pain Location: right side low back and RLE Pain Descriptors / Indicators: Shooting;Sharp;Grimacing Pain Intervention(s): Limited activity within patient's tolerance;Monitored during session    Home Living                      Prior Function            PT Goals (current goals can now be found in the care plan section) Acute Rehab PT Goals Patient Stated Goal: return home PT Goal Formulation: With patient Time For Goal Achievement: 03/22/18 Potential to Achieve Goals: Good Progress towards PT goals: Progressing toward goals    Frequency    Min 2X/week      PT Plan Current plan remains appropriate    Co-evaluation              AM-PAC PT "6 Clicks" Mobility   Outcome Measure  Help needed turning from your back to your side while in a flat bed without using bedrails?: None Help needed moving from lying on your back to sitting on the side of a flat bed without using bedrails?: None Help needed moving to and from a bed to a chair (including a wheelchair)?: A Lot Help needed standing up from a chair using your arms (e.g., wheelchair or bedside chair)?: A Lot Help needed to walk in hospital room?: A Lot Help needed climbing 3-5 steps with a railing? : Total 6 Click Score: 15    End of Session Equipment Utilized During Treatment: Oxygen Activity Tolerance: Patient tolerated treatment well;Patient limited by fatigue;Patient limited by pain Patient left: in chair;with call bell/phone within reach Nurse Communication: Mobility status PT Visit Diagnosis: Unsteadiness on feet (R26.81);Other abnormalities of gait and mobility (R26.89);Muscle weakness (generalized) (M62.81)     2:51 PM, 03/08/18 Ocie Bob, MPT Physical Therapist with [facility] 336 [phone] office [phone] mobile phone

## 2018-03-09 LAB — CBC
HCT: 27.9 % — ABNORMAL LOW (ref 36.0–46.0)
Hemoglobin: 8.1 g/dL — ABNORMAL LOW (ref 12.0–15.0)
MCH: 26.7 pg (ref 26.0–34.0)
MCHC: 29 g/dL — ABNORMAL LOW (ref 30.0–36.0)
MCV: 92.1 fL (ref 80.0–100.0)
PLATELETS: 347 10*3/uL (ref 150–400)
RBC: 3.03 MIL/uL — ABNORMAL LOW (ref 3.87–5.11)
RDW: 20 % — ABNORMAL HIGH (ref 11.5–15.5)
WBC: 14.4 10*3/uL — ABNORMAL HIGH (ref 4.0–10.5)
nRBC: 0.1 % (ref 0.0–0.2)

## 2018-03-09 LAB — BASIC METABOLIC PANEL
Anion gap: 7 (ref 5–15)
BUN: 6 mg/dL (ref 6–20)
CO2: 31 mmol/L (ref 22–32)
Calcium: 8.2 mg/dL — ABNORMAL LOW (ref 8.9–10.3)
Chloride: 97 mmol/L — ABNORMAL LOW (ref 98–111)
Creatinine, Ser: 0.54 mg/dL (ref 0.44–1.00)
GFR calc Af Amer: >60 mL/min (ref >60)
GFR calc non Af Amer: >60 mL/min (ref >60)
Glucose, Bld: 113 mg/dL — ABNORMAL HIGH (ref 70–99)
Potassium: 3.9 mmol/L (ref 3.5–5.1)
SODIUM: 135 mmol/L (ref 135–145)

## 2018-03-09 MED ORDER — BOOST / RESOURCE BREEZE PO LIQD CUSTOM
1.0000 | Freq: Two times a day (BID) | ORAL | Status: DC
  Administered 2018-03-12 (×2): 1 via ORAL

## 2018-03-09 NOTE — Progress Notes (Signed)
PROGRESS NOTE  Amy Mcknight PFX:[id number] DOB: 1995/12/16 DOA: 02/08/2018 PCP: Patient, No Pcp Per  Brief History: 23 y.o.femalewith medical history significant forsubstance use disorderwho presents to the ED with 2 days of right lower back pain. Patient states symptoms began with acute onset while at rest. Symptoms have been persistent. She has noted associated diaphoresis, chills, shortness of breath, cough productive of brown sputum, and right chest wall pain. She denies any rashes or obvious skin changes. She reports good urine output without dysuria. She denies any abdominal pain, diarrhea, or constipation.  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 She continues to use recreational drugs by snorting nasally. She denies any alcohol use. She reports a history of withdrawal from opiates in the past.And is noted to be positive for marijuana and opiates on her UDS.  She is noted to be anemicand is status post 2 unit PRBC transfusion with improvement noted. No overt bleeding currently identified, but patient is complaining of some epigastric abdominal pain. As result of this pain, she has not been eating very much. She denies any nausea or vomiting.GI has evaluated patient with no need for endoscopy noted at this time. She intermittently refuses bathing and laboratory draws. She has been using a bedpan and has not been eating very well. She has been more somnolent from time to time possibly due to oversedation;  In the past week, pt was eating better and conversant and more compliant with medical therapy.  Lasix was started for fluid overload with +18kg since admission.  Repeat limited echo was obtained and showed some progression of her TR.  As a result cardiology was consulted to assist.  She is not a candidate for TV replacement/repair currently.  Assessment/Plan: Sepsis with early Septic shock -due toMSSA bacteremia, tricuspid  endocarditis, paravertebral myositis and cavitary pneumonia -sepsis pathophysiology and septic shock has resolved -Case discussedpreviouslywith infectious disease doctor -continue IV Ancef for total of 6 weeks from date of first negative culture which was 02/12/2018 in a supervised setting ([facility]) -2D echo positive for tricuspid vegetation and moderate regurgitation. 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 -remains afebrile and hemodynamically stable -continue Ancef.  Acute Diastolic CHF -pt +32 kg since admission -continueIV Lasix -follow daily weights and strict I's and O's -repeat CXR in 2 days. -03/05/18--repeat limited echo--more significant prolapse of TV with severe TR; LVEF 60-65% -daily weights--NEG 2.3 kg since starting lasix -Some improvement appreciated new patient fluid overload -There has been also increasing her urine output with over 2 L negative overnight.  Will continue Lasix 60 mg every 12 hours and heart healthy/low-sodium diet with fluid restriction. -appreciate cardiology consult. -educated about importance on watching sodium intake.  Hypomagnesemia -repleted And started on maintenance supplementation.   Hypokalemia -repleted and started on daily maintenance. -follow trend   AKI (acute kidney injury) (HCC) -resolved -serum creatinine peaked 2.61 -will follow BMET intermittently; especially now that we are addressing fluid overload with IV diuresis..  Substance use disorder/opiate withdrawal syndrome -she appears to be resting comfortably for extended/long periods of time without any evidence of pain, patient has been weaned off Dilaudid, -okay to continuePRN  low-dose oxycodone continuemethocarbamol 750mg  4 times daily, Cymbalta 30 mg twice daily and gabapentin 300 mg 3  times daily -continue hydroxyzine 25 mg nightly -UDS on 02/24/2018 shows opiates --- this was done after there was suspicion of possible inhalation/snorting of drugs in the room on the telemetry sitter camera -Dilaudid has been discontinued patient only has PRN oxycodone 5 mg at this time -she is out of the window for active withdrawal symptoms and will plan on avoiding any extra opioids.  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  -patient continues to refuse to participate in ADLs often, wanting to have staff do more and more for her -complaints of back pain as of 02/25/2018, patient currently has MSSA bacteremia and back pain--- lumbar MRI from 02/26/2018 without any evidence of epidural abscess or significant inflammation , (prior lumbar MRI from 02/08/2018 was without contrast as patient refused contrast at that time ) -OOB with meals -PT intermittently.  Anemia of chronic disease -suspecthemolysis in the setting of acute infection-stable -Anemia panelrevealed some iron deficiency and stool occult is negative -2 unit PRBC orderedon 02/14/18--- Hgb remains stable post prior transfusion -Appreciate GI evaluation with no recommendations for endoscopy at this time -Continue p.o. PPI, --Ferahemegiven during this admission -3/1-given dose ferrelicit -continue following Hgb trend  Depression/Anxiety---- -stable -continue cymbalta -may use hydroxyzine for sleep and anxiety    Disposition Plan: Home on 03/26/18 if stable; continue IV antibiotics. Family Communication:NoFamily at  bedside  Consultants:ID  Code Status: FULL   DVT Prophylaxis: SCDs   Procedures: As Listed in Progress Note Above  Antibiotics: Cefazolin 2/7>>> with anticipated last dose on 03/26/2018    Subjective: Afebrile.  No abdominal pain, no vomiting, no hematuria, melena, hematochezia or dysuria.  Still with intermittent episode of nausea and back pain.  Patient reports feeling anxious.   Objective: Vitals:   03/08/18 0538 03/08/18 1338 03/08/18 2249 03/09/18 1300  BP: (!) 129/106 114/88 117/88 119/83  Pulse: (!) 102 (!) 101 (!) 116 (!) 111  Resp: 18 18 20 19   Temp: 97.6 F (36.4 C) 98.1 F (36.7 C) 98.3 F (36.8 C) 98.4 F (36.9 C)  TempSrc: Oral Oral Oral Oral  SpO2: 100% 100% 100% 99%  Weight:      Height:        Intake/Output Summary (Last 24 hours) at 03/09/2018 1435 Last data filed at 03/09/2018 0900 Gross per 24 hour  Intake 720 ml  Output 2750 ml  Net -2030 ml   Weight change:    Exam: General exam: Alert, awake, oriented x 3; no fever, denying any vomiting or abdominal pain.  Still having intermittent episode of back pain and nausea.  Patient expressed feeling anxious.  She reported increasing urine output. Respiratory system: No using accessory muscles.  No wheezing.  Mild bibasilar crackles on exam. Cardiovascular system: Mild sinus tachycardia, no rubs, no gallops.  Positive systolic ejection murmur appreciated on exam. Gastrointestinal system: Abdomen is nondistended, soft and nontender. No organomegaly or masses felt. Normal bowel sounds heard. Central nervous system: Alert and oriented. No focal neurological deficits. Extremities: No cyanosis or clubbing.  1-2+ edema bilaterally.  (Improving). Skin: No rashes, lesions or ulcers Psychiatry: Judgement and insight appear normal. Mood & affect appropriate.   Data Reviewed: I have personally reviewed following labs and imaging studies.  Basic Metabolic Panel: Recent Labs  Lab 03/03/18 0716  03/04/18 0645 03/05/18 1610 03/06/18 0459 03/07/18 1818  03/09/18 0637  NA 135 137 136 137 131* 135  K 3.6 3.4* 3.6 3.8 4.6 3.9  CL 102 102 100 100 96* 97*  CO2 27 27 28 29 27 31   GLUCOSE 94 92 93 97 134* 113*  BUN 7 8 7 8 8 6   CREATININE 0.42* 0.49 0.43* 0.44 0.52 0.54  CALCIUM 8.0* 8.0* 8.3* 8.2* 8.1* 8.2*  MG 1.6* 1.4*  --  1.5* 1.7  --    Liver Function Tests: Recent Labs  Lab 03/05/18 0635  AST 29  ALT 10  ALKPHOS 117  BILITOT 0.2*  PROT 6.6  ALBUMIN 1.7*   CBC: Recent Labs  Lab 03/04/18 0645 03/05/18 0635 03/07/18 1818 03/09/18 0637  WBC 12.2* 12.6* 16.3* 14.4*  HGB 7.6* 8.0* 8.4* 8.1*  HCT 26.2* 28.1* 28.8* 27.9*  MCV 91.0 91.8 91.4 92.1  PLT 352 390 374 347   Urine analysis:    Component Value Date/Time   COLORURINE YELLOW 02/24/2018 1845   APPEARANCEUR HAZY (A) 02/24/2018 1845   LABSPEC 1.020 02/24/2018 1845   PHURINE 5.0 02/24/2018 1845   GLUCOSEU NEGATIVE 02/24/2018 1845   HGBUR MODERATE (A) 02/24/2018 1845   BILIRUBINUR NEGATIVE 02/24/2018 1845   KETONESUR NEGATIVE 02/24/2018 1845   PROTEINUR 30 (A) 02/24/2018 1845   NITRITE NEGATIVE 02/24/2018 1845   LEUKOCYTESUR SMALL (A) 02/24/2018 1845    Scheduled Meds: . diclofenac  1 patch Transdermal BID  . DULoxetine  30 mg Oral BID  . feeding supplement  1 Container Oral BID BM  . furosemide  60 mg Intravenous BID  . gabapentin  300 mg Oral TID  . hydrOXYzine  25 mg Oral QHS  . magnesium oxide  400 mg Oral BID  . methocarbamol  750 mg Oral QID  . metoprolol tartrate  75 mg Oral BID  . multivitamin with minerals  1 tablet Oral Daily  . pantoprazole  40 mg Oral BID  . potassium chloride  20 mEq Oral Daily  . senna-docusate  2 tablet Oral BID  . sodium chloride flush  10-40 mL Intracatheter Q12H  . sodium chloride flush  3 mL Intravenous Q12H   Continuous Infusions: .  ceFAZolin (ANCEF) IV 2 g (03/09/18 0559)    Procedures/Studies: Ct Abdomen Pelvis Wo Contrast  Result Date:  02/08/2018 CLINICAL DATA:  Abdominal pain and fever EXAM: CT ABDOMEN AND PELVIS WITHOUT CONTRAST TECHNIQUE: Multidetector CT imaging of the abdomen and pelvis was performed following the standard protocol without oral or IV contrast. COMPARISON:  None. FINDINGS: Lower chest: There is airspace consolidation throughout the lung bases with several areas of cavitation, likely developing lung abscesses. Several nodular opacities are noted associated with these areas of cavitary consolidation, likely septic emboli. Hepatobiliary: Liver measures 20.5 cm in length. No focal liver lesions are appreciable on this noncontrast enhanced study. The gallbladder is borderline dilated without wall thickening. There is no biliary duct dilatation. Pancreas: No pancreatic mass or inflammatory focus. Spleen: Spleen measures 14.1 x 10.9 x 5.1 cm with a measured splenic volume of 392 cubic cm. No focal splenic lesions are evident. Adrenals/Urinary Tract: Adrenals appear unremarkable bilaterally. 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 No renal mass evident. No hydronephrosis on either side. There is a suspected developing staghorn calculus on the left with somewhat amorphous calcification involving a lower pole calyx on the left extending into the left renal pelvis, best appreciated on coronal imaging. This developing staghorn calculus measures 3.7 x 1.0 cm. No ureteral calculi are evident. Urinary bladder is decompressed.  No urinary bladder wall thickening is appreciable with essentially empty bladder. Stomach/Bowel: There is fluid throughout most small bowel loops. There is no appreciable bowel wall or mesenteric thickening. No evident bowel obstruction. No free air or portal venous air is evident. Vascular/Lymphatic: There is no abdominal aortic aneurysm. No vascular lesions are evident on this noncontrast enhanced study. Note that the attenuation throughout the vascular structures appear slightly diminished which raises question of  anemia. No adenopathy is appreciable in abdomen or pelvis. Reproductive: Uterus is anteverted. No pelvic masses evident. There is a slight degree of free fluid in the cul-de-sac region. Other: Appendix is not well seen. There is no periappendiceal region inflammation on this study. No abscess is seen in the abdomen or pelvis. There is no ascites beyond the slight fluid noted in the cul-de-sac region. Musculoskeletal: There are no blastic or lytic bone lesions. No intramuscular or abdominal wall lesions are appreciable. IMPRESSION: Comment: Note that paucity of fat makes assessment in the abdomen and pelvis somewhat less than optimal. 1. Areas of cavitary pneumonia, likely with developing lung abscesses, in the right lower lobe. Areas of consolidation elsewhere with probable septic emboli associated. 2. Prominent liver and spleen. No focal liver and splenic lesions evident. 3. There is evidence of a degree of nephrocalcinosis. Amorphous calcification in a portion of the left renal pelvis and an inferior left calyx suggest developing staghorn calculus. No hydronephrosis on either side. No well-defined ureteral calculi. 4. Fluid in most loops of small bowel. Suspect a degree of ileus or enteritis. No bowel obstruction. No abscess evident in the abdomen or pelvis. 5. Small amount of free fluid in the cul-de-sac may be upper physiologic. Electronically Signed   By: Bretta Bang III M.D.   On: 02/08/2018 14:47   Dg Chest 1 View  Result Date: 02/08/2018 CLINICAL DATA:  Hypotension and tachycardia. Smoking history. Asthma. EXAM: CHEST  1 VIEW COMPARISON:  None. FINDINGS: Heart size is within normal limits. Mild prominence of the LEFT hilum. Patchy opacities bilaterally, most prominent at the RIGHT lung base, majority of which have central lucencies suggest cavitary lesions. IMPRESSION: 1. Patchy opacities bilaterally, majority of which have central lucencies suggest cavitary masses/infections. Corresponding cavitary  consolidations noted at the lung bases of a CT abdomen performed at the time of today's chest x-ray. Recommend CT chest with contrast for complete evaluation. 2. Questionable prominence of the LEFT hilum, additional cavitary mass/consolidation versus central bronchiectasis. Electronically Signed   By: Bary Richard M.D.   On: 02/08/2018 14:48   Dg Chest 2 View  Result Date: 02/24/2018 CLINICAL DATA:  Short of breath EXAM: CHEST - 2 VIEW COMPARISON:  02/20/2018 FINDINGS: Upper normal heart size. Stable left pleural effusion. Tiny right pleural effusion is stable. Patchy airspace opacities throughout both lungs are not significantly changed. No pneumothorax. IMPRESSION: Bilateral airspace disease and bilateral pleural effusions left greater than right are stable. Electronically Signed   By: Jolaine Click M.D.   On: 02/24/2018 15:50   Mr Lumbar Spine W Wo Contrast  Result Date: 02/26/2018 CLINICAL DATA:  Low back pain since prior abnormal MRI 02/08/2018. Unable to walk. EXAM: MRI LUMBAR SPINE WITHOUT AND WITH CONTRAST TECHNIQUE: Multiplanar and multiecho pulse sequences of the lumbar spine were obtained without and with intravenous contrast. CONTRAST:  7 mL Gadavist COMPARISON:  02/08/2018 FINDINGS: Segmentation:  Standard. Alignment:  Physiologic. Vertebrae:  No fracture, evidence of discitis, or bone lesion. Conus medullaris and cauda equina: Conus extends to the L1 level. Conus and  cauda equina appear normal. Paraspinal and other soft tissues: Posterior paraspinal muscle edema bilaterally with mild enhancement on postcontrast imaging. No intramuscular fluid collection or hematoma. Disc levels: Disc spaces: Disc spaces are maintained. T12-L1: No significant disc bulge. No evidence of neural foraminal stenosis. No central canal stenosis. L1-L2: No significant disc bulge. No evidence of neural foraminal stenosis. No central canal stenosis. L2-L3: No significant disc bulge. No evidence of neural foraminal  stenosis. No central canal stenosis. L3-L4: No significant disc bulge. No evidence of neural foraminal stenosis. No central canal stenosis. L4-L5: Minimal broad-based disc bulge. No evidence of neural foraminal stenosis. No central canal stenosis. L5-S1: Minimal broad-based disc bulge. No evidence of neural foraminal stenosis. No central canal stenosis. IMPRESSION: 1. No epidural fluid collection.  No discitis or osteomyelitis. 2. Posterior paraspinal muscle edema bilaterally with mild enhancement on postcontrast imaging. No intramuscular fluid collection or hematoma. Differential considerations include muscle strain versus mild myositis. Electronically Signed   By: Elige Ko   On: 02/26/2018 09:53   Mr Lumbar Spine W Wo Contrast  Result Date: 02/08/2018 CLINICAL DATA:  Acute onset of back pain beginning 2 days ago. Study was ordered with contrast but the patient refused. EXAM: MRI LUMBAR SPINE WITHOUT CONTRAST TECHNIQUE: Multiplanar, multisequence MR imaging of the lumbar spine was performed. No intravenous contrast was administered. COMPARISON:  None. FINDINGS: Segmentation:  5 lumbar type vertebral bodies. Alignment:  Normal Vertebrae:  Normal Conus medullaris and cauda equina: Conus extends to the L1 level. Conus and cauda equina appear normal. Paraspinal and other soft tissues: There is lower paraspinous muscle edema as might be seen with a muscular strain. This appears symmetric from right to left. Disc levels: No abnormality at L3-4 or above. L4-5: Very minimal disc bulge.  No stenosis or neural compression. L5-S1: Very minimal disc bulge.  No stenosis or neural compression. IMPRESSION: Minimal disc bulges at L4-5 and L5-S1. No stenosis or neural compression. Paraspinous muscle edema on both sides in the lower lumbar region as might be seen with a muscular strain. This is nonspecific. The differential diagnosis does include infection, but the symmetric nature would be unusual. The patient refused  contrast administration. Electronically Signed   By: Paulina Fusi M.D.   On: 02/08/2018 13:56   Dg Chest Port 1 View  Result Date: 03/01/2018 CLINICAL DATA:  Shortness of breath EXAM: PORTABLE CHEST 1 VIEW COMPARISON:  02/24/2018 FINDINGS: Cardiomegaly. Moderate to large bilateral pleural effusions, stable on the left, increasing on the right since prior study. Diffuse bilateral airspace disease has worsened since prior study and slightly more pronounced on the right. This could reflect asymmetric edema or pneumonia. IMPRESSION: Moderate to large bilateral pleural effusions, increasing on the right since prior study. Diffuse bilateral airspace disease, right greater than left, worsening since prior study. This could reflect asymmetric edema or pneumonia. Electronically Signed   By: Charlett Nose M.D.   On: 03/01/2018 18:14   Dg Chest Port 1 View  Result Date: 02/20/2018 CLINICAL DATA:  Sepsis, endocarditis, cavitary pneumonia with septic emboli and fever. EXAM: PORTABLE CHEST 1 VIEW COMPARISON:  02/08/2018 FINDINGS: Multiple cavitary lesions again identified in both lungs with associated airspace disease in a pattern likely representing septic emboli with multiple areas of cavitary infection. There is a new left lateral pleural effusion which appears likely loculated and may represent an empyema. Further evaluation with CT of the chest with contrast may be helpful. The heart size is stable. IMPRESSION: Persistent appearance of bilateral cavitary pneumonia  likely representing septic emboli. There is a new loculated left lateral pleural effusion that may represent a developing empyema. Further evaluation with CT of the chest with contrast may be helpful. Electronically Signed   By: Irish Lack M.D.   On: 02/20/2018 19:11   [place] Ekg Site Rite  Result Date: 02/26/2018 If Site Rite image not attached, placement could not be confirmed due to current cardiac rhythm.   Vassie Loll, MD [facility] Pager [phone]  03/09/2018, 2:35 PM   LOS: 29 days

## 2018-03-09 NOTE — Progress Notes (Signed)
Nutrition Follow up  DOCUMENTATION CODES:   Severe malnutrition in context of acute illness/injury  INTERVENTION:  Regular diet /snack between meals   D/c -Magic Cup BID  Add -Berry- Boost Breeze po BID, each supplement provides 250 kcal and 9 grams of protein   MVI daily  NUTRITION DIAGNOSIS:   Severe Malnutrition related to acute illness(MSSA bacteremia, Pneumonia, endocarditis.) as evidenced by energy intake < or equal to 50% for > or equal to 5 days, 17% wt gain (BLE-(moderate to severe) edema).    GOAL:  Patient will meet greater than or equal to 90% of their needs; progressing with improved appetite and meal intake  MONITOR:   PO intake, Weight trends, Labs, I & O's  ASSESSMENT: Patient is a 23 yo female with a history of substance abuse, severe anemia (received 2 units PRBC's), MSSA bacteremia, Pneumonia, endocarditis. Bi-lateral pedal edema. Severe weight gain since admission-17% increase in wt or gain of 9.9 kg.   Supplements: none- patient refuses offer of any supplements or milk with meals- replies, "I don't have a taste for anything." Explained to patient that her body requires nutrition whether she "feels like" eating or not. RD unable to identify any food or beverage that pt would commit to eat or drink.  F/U- 2/24 patient consumed 100% of breakfast this morning and on RD arrival she is waiting on nutrition services to bring a snack of cereal. Patient says she is eating better and her demeanor is better today. Lower extremity edema looks better and she is wearing her ted hose. Labs reviewed. Current weight 68 kg which is a gain of 13.6 kg (20%).   F/U-2/28 patient ate 75% of breakfast this morning. Her weight continues to increase - an additional 5 kg since 2/24 and a total of 18 kg since admission. MD notified. No changes to nutrition plan at this point.  F/U- 3/6 patient consuming 25-50% meals per recent chart documentation and pt report. Talked with nutrition  services staff who affirm intake "comes and goes". Today patient is starting to eat breakfast cold cereal, eggs bacon and biscuit. Talked with her again about adding an oral nutrition supplement and she was accepting of the idea today. She prefers juice flavors and wants to trail [employer]. Will add to her nutrition regimen between meals. Previous severe wt gain noted- she has desirable loss from 73.4 down to 71.8 kg since last follow-up.   Intake/Output Summary (Last 24 hours) at 03/09/2018 0813 Last data filed at 03/08/2018 2000 Gross per 24 hour  Intake 1080 ml  Output 2350 ml  Net -1270 ml     Labs: BMP Latest Ref Rng & Units 03/09/2018 03/07/2018 03/06/2018  Glucose 70 - 99 mg/dL 048(G) 891(Q) 97  BUN 6 - 20 mg/dL 6 8 8   Creatinine 0.44 - 1.00 mg/dL 9.45 0.38 8.82  Sodium 135 - 145 mmol/L 135 131(L) 137  Potassium 3.5 - 5.1 mmol/L 3.9 4.6 3.8  Chloride 98 - 111 mmol/L 97(L) 96(L) 100  CO2 22 - 32 mmol/L 31 27 29   Calcium 8.9 - 10.3 mg/dL 8.2(L) 8.1(L) 8.2(L)     Diet Order:   Diet Order            Diet Heart Room service appropriate? Yes; Fluid consistency: Thin; Fluid restriction: 2000 mL Fluid  Diet effective now              EDUCATION NEEDS:   Not appropriate for education at this time   Skin:  Skin  Assessment: Reviewed RN Assessment  Last BM:  2/27 large BM  Height:   Ht Readings from Last 1 Encounters:  03/04/18 5\' 3"  (1.6 m)    Weight:   Wt Readings from Last 1 Encounters:  03/08/18 71.8 kg    Ideal Body Weight:  52 kg  BMI:  Body mass index is 28.04 kg/m.  Estimated Nutritional Needs:   Kcal:  1650-1815 (30-33 kcal/kg/bw)(Based on admission wt of 55 kg. Severe wt gain (20 lb) since admission.)  Protein:  88-99 (1.6-1.8 gr/kg/bw)  Fluid:  per MD goals   Royann Shivers MS,RD,CSG,LDN Office: [phone] Pager: [phone]

## 2018-03-09 NOTE — Plan of Care (Signed)

## 2018-03-09 NOTE — Progress Notes (Signed)
PT Cancellation Note  Patient Details Name: Amy Mcknight MRN: [id number] DOB: Oct 20, 1995   Cancelled Treatment:    Reason Eval/Treat Not Completed: Patient declined, no reason specified.  Patient declined therapy secondary to stating she had to go to X-ray, encouraged her to attempt some activity while waiting, but continued to decline.   3:20 PM, 03/09/18 Ocie Bob, MPT Physical Therapist with [facility] [phone] office [phone] mobile phone

## 2018-03-10 MED ORDER — CLONAZEPAM 0.5 MG PO TABS
0.5000 mg | ORAL_TABLET | Freq: Three times a day (TID) | ORAL | Status: DC | PRN
  Administered 2018-03-10 – 2018-03-14 (×12): 0.5 mg via ORAL
  Filled 2018-03-10 (×12): qty 1

## 2018-03-10 NOTE — Progress Notes (Signed)
PICC line dressing changed using sterile technique  

## 2018-03-10 NOTE — Progress Notes (Signed)
PROGRESS NOTE  Amy Mcknight XBJ:[id number] DOB: 1995/01/24 DOA: 02/08/2018 PCP: Patient, No Pcp Per  Brief History: 23 y.o.femalewith medical history significant forsubstance use disorderwho presents to the ED with 2 days of right lower back pain. Patient states symptoms began with acute onset while at rest. Symptoms have been persistent. She has noted associated diaphoresis, chills, shortness of breath, cough productive of brown sputum, and right chest wall pain. She denies any rashes or obvious skin changes. She reports good urine output without dysuria. She denies any abdominal pain, diarrhea, or constipation.  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 She continues to use recreational drugs by snorting nasally. She denies any alcohol use. She reports a history of withdrawal from opiates in the past.And is noted to be positive for marijuana and opiates on her UDS.  She is noted to be anemicand is status post 2 unit PRBC transfusion with improvement noted. No overt bleeding currently identified, but patient is complaining of some epigastric abdominal pain. As result of this pain, she has not been eating very much. She denies any nausea or vomiting.GI has evaluated patient with no need for endoscopy noted at this time. She intermittently refuses bathing and laboratory draws. She has been using a bedpan and has not been eating very well. She has been more somnolent from time to time possibly due to oversedation;  In the past week, pt was eating better and conversant and more compliant with medical therapy.  Lasix was started for fluid overload with +18kg since admission.  Repeat limited echo was obtained and showed some progression of her TR.  As a result cardiology was consulted to assist.  She is not a candidate for TV replacement/repair currently.  Assessment/Plan: Sepsis with early Septic shock -due toMSSA bacteremia, tricuspid  endocarditis, paravertebral myositis and cavitary pneumonia -sepsis pathophysiology and septic shock has resolved -Case discussedpreviouslywith infectious disease doctor -continue IV Ancef for total of 6 weeks from date of first negative culture which was 02/12/2018 in a supervised setting ([facility]) -2D echo positive for tricuspid vegetation and moderate regurgitation. 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 -remains afebrile and hemodynamically stable -continue Ancef.  Acute Diastolic CHF -pt +30 kg since admission -continueIV Lasix -follow daily weights and strict I's and O's -repeat CXR tomorrow am. -03/05/18--repeat limited echo--more significant prolapse of TV with severe TR; LVEF 60-65% -daily weights--NEG 3.3 kg since starting lasix -Some improvement appreciated new patient fluid overload -There has been also increasing her urine output with over 2 L negative overnight.  Will continue Lasix 60 mg every 12 hours and heart healthy/low-sodium diet with fluid restriction. -appreciate cardiology consult. -educated about importance on watching sodium intake.  Hypomagnesemia -repleted And started on maintenance supplementation.   Hypokalemia -repleted and started on daily maintenance. -follow trend   AKI (acute kidney injury) (HCC) -resolved -serum creatinine peaked 2.61 -will follow BMET intermittently; especially now that we are addressing fluid overload with IV diuresis..  Substance use disorder/opiate withdrawal syndrome/anxiety-she appears to be resting comfortably for extended/long periods of time without any evidence of pain, patient has been weaned off Dilaudid, -okay to  continuePRN low-dose oxycodone continuemethocarbamol  4 times daily, Cymbalta 30 mg twice daily and gabapentin 300 mg 3 times daily -  Discontinue hydroxyzine and use low-dose Klonopin up to  3 times a day as needed for anxiety. -UDS on 02/24/2018 shows opiates --- this was done after there was suspicion of possible inhalation/snorting of drugs in the room on the telemetry sitter camera -Dilaudid has been discontinued patient only has PRN oxycodone 5 mg at this time -she is out of the window for active withdrawal symptoms and will plan on avoiding any extra opioids.  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  -patient continues to refuse to participate in ADLs often, wanting to have staff do more and more for her -complaints of back pain as of 02/25/2018, patient currently has MSSA bacteremia and back pain--- lumbar MRI from 02/26/2018 without any evidence of epidural abscess or significant inflammation , (prior lumbar MRI from 02/08/2018 was without contrast as patient refused contrast at that time ) -OOB with meals -PT intermittently.  Anemia of chronic disease -suspecthemolysis in the setting of acute infection-stable -Anemia panelrevealed some iron deficiency and stool occult is negative -2 unit PRBC orderedon 02/14/18--- Hgb remains stable post prior transfusion -Appreciate GI evaluation with no recommendations for endoscopy at this time -Continue p.o. PPI, --Ferahemegiven during this admission -3/1-given dose ferrelicit -continue following Hgb trend  Depression/Anxiety---- -stable -continue cymbalta -may use hydroxyzine for sleep and anxiety    Disposition Plan: Home on 03/26/18 if  stable; continue IV antibiotics. Family Communication:NoFamily at bedside  Consultants:ID  Code Status: FULL   DVT Prophylaxis: SCDs   Procedures: As Listed in Progress Note Above  Antibiotics: Cefazolin 2/7>>> with anticipated last dose on 03/26/2018    Subjective: Patient was noted to be anxious during examination.  She is afebrile; no chest pain, no nausea, no vomiting, no abdominal pain.   Objective: Vitals:   03/09/18 2140 03/09/18 2141 03/10/18 0549 03/10/18 0626  BP: (!) 123/95 (!) 123/95 (!) 124/95   Pulse: (!) 115 (!) 115 (!) 109   Resp:  16 16   Temp:  99 F (37.2 C) 97.6 F (36.4 C)   TempSrc:  Oral Oral   SpO2:  99% 100%   Weight:    71.4 kg  Height:        Intake/Output Summary (Last 24 hours) at 03/10/2018 1433 Last data filed at 03/10/2018 0805 Gross per 24 hour  Intake 240 ml  Output 550 ml  Net -310 ml   Weight change:    Exam: General exam: Alert, awake, oriented x 3; afebrile, no nausea, no vomiting, no abdominal pain.  Patient is slightly anxious during assessment; still with signs of fluid overload (even improved and with increased urine output as per patient report). Respiratory system: Mild bibasilar Rales, positive scattered rhonchi, no wheezing, normal respiratory effort.   Cardiovascular system: Sinus tachycardia, soft systolic ejection murmur, no rubs, no gallops. Gastrointestinal system: Abdomen is nondistended, soft and nontender. No organomegaly or masses felt. Normal bowel sounds heard. Central nervous system: Alert and oriented. No focal neurological deficits. Extremities: No cyanosis or clubbing.  1+ edema bilaterally. Skin: No rashes, lesions or ulcers Psychiatry: Judgement and insight appear normal.  Anxious mood appreciated on exam.   Data Reviewed: I have personally reviewed following labs and imaging studies.  Basic Metabolic Panel: Recent Labs  Lab 03/04/18 0645 03/05/18 5456 03/06/18 0459 03/07/18 1818  03/09/18 0637  NA  137 136 137 131* 135  K 3.4* 3.6 3.8 4.6 3.9  CL 102 100 100 96* 97*  CO2 GLUCOSE 92 93 97 134* 113*  BUN CREATININE 0.49 0.43* 0.44 0.52 0.54  CALCIUM 8.0* 8.3* 8.2* 8.1* 8.2*  MG 1.4*  --  1.5* 1.7  --    Liver Function Tests: Recent Labs  Lab 03/05/18 0635  AST 29  ALT 10  ALKPHOS 117  BILITOT 0.2*  PROT 6.6  ALBUMIN 1.7*   CBC: Recent Labs  Lab 03/04/18 0645 03/05/18 0635 03/07/18 1818 03/09/18 0637  WBC 12.2* 12.6* 16.3* 14.4*  HGB 7.6* 8.0* 8.4* 8.1*  HCT 26.2* 28.1* 28.8* 27.9*  MCV 91.0 91.8 91.4 92.1  PLT 352 390 374 347   Urine analysis:    Component Value Date/Time   COLORURINE YELLOW 02/24/2018 1845   APPEARANCEUR HAZY (A) 02/24/2018 1845   LABSPEC 1.020 02/24/2018 1845   PHURINE 5.0 02/24/2018 1845   GLUCOSEU NEGATIVE 02/24/2018 1845   HGBUR MODERATE (A) 02/24/2018 1845   BILIRUBINUR NEGATIVE 02/24/2018 1845   KETONESUR NEGATIVE 02/24/2018 1845   PROTEINUR 30 (A) 02/24/2018 1845   NITRITE NEGATIVE 02/24/2018 1845   LEUKOCYTESUR SMALL (A) 02/24/2018 1845    Scheduled Meds: . diclofenac  1 patch Transdermal BID  . DULoxetine  30 mg Oral BID  . feeding supplement  1 Container Oral BID BM  . furosemide  60 mg Intravenous BID  . gabapentin  300 mg Oral TID  . magnesium oxide  400 mg Oral BID  . methocarbamol  750 mg Oral QID  . metoprolol tartrate  75 mg Oral BID  . multivitamin with minerals  1 tablet Oral Daily  . pantoprazole  40 mg Oral BID  . potassium chloride  20 mEq Oral Daily  . senna-docusate  2 tablet Oral BID  . sodium chloride flush  10-40 mL Intracatheter Q12H  . sodium chloride flush  3 mL Intravenous Q12H   Continuous Infusions: .  ceFAZolin (ANCEF) IV 2 g (03/10/18 1312)    Procedures/Studies: Ct Abdomen Pelvis Wo Contrast  Result Date: 02/08/2018 CLINICAL DATA:  Abdominal pain and fever EXAM: CT ABDOMEN AND PELVIS WITHOUT CONTRAST TECHNIQUE: Multidetector CT imaging of the  abdomen and pelvis was performed following the standard protocol without oral or IV contrast. COMPARISON:  None. FINDINGS: Lower chest: There is airspace consolidation throughout the lung bases with several areas of cavitation, likely developing lung abscesses. Several nodular opacities are noted associated with these areas of cavitary consolidation, likely septic emboli. Hepatobiliary: Liver measures 20.5 cm in length. No focal liver lesions are appreciable on this noncontrast enhanced study. The gallbladder is borderline dilated without wall thickening. There is no biliary duct dilatation. Pancreas: No pancreatic mass or inflammatory focus. Spleen: Spleen measures 14.1 x 10.9 x 5.1 cm with a measured splenic volume of 392 cubic cm. No focal splenic lesions are evident. Adrenals/Urinary Tract: Adrenals appear unremarkable bilaterally. 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 No renal mass evident. No hydronephrosis on either side. There is a suspected developing staghorn calculus on the left with somewhat amorphous calcification involving a lower pole calyx on the left extending into the left renal pelvis, best appreciated on coronal imaging. This developing staghorn calculus measures 3.7 x 1.0 cm. No ureteral calculi are evident. Urinary bladder is decompressed. No urinary bladder wall thickening is appreciable with essentially empty bladder. Stomach/Bowel: There is fluid throughout most small bowel loops. There  is no appreciable bowel wall or mesenteric thickening. No evident bowel obstruction. No free air or portal venous air is evident. Vascular/Lymphatic: There is no abdominal aortic aneurysm. No vascular lesions are evident on this noncontrast enhanced study. Note that the attenuation throughout the vascular structures appear slightly diminished which raises question of anemia. No adenopathy is appreciable in abdomen or pelvis. Reproductive: Uterus is anteverted. No pelvic masses evident. There is a slight  degree of free fluid in the cul-de-sac region. Other: Appendix is not well seen. There is no periappendiceal region inflammation on this study. No abscess is seen in the abdomen or pelvis. There is no ascites beyond the slight fluid noted in the cul-de-sac region. Musculoskeletal: There are no blastic or lytic bone lesions. No intramuscular or abdominal wall lesions are appreciable. IMPRESSION: Comment: Note that paucity of fat makes assessment in the abdomen and pelvis somewhat less than optimal. 1. Areas of cavitary pneumonia, likely with developing lung abscesses, in the right lower lobe. Areas of consolidation elsewhere with probable septic emboli associated. 2. Prominent liver and spleen. No focal liver and splenic lesions evident. 3. There is evidence of a degree of nephrocalcinosis. Amorphous calcification in a portion of the left renal pelvis and an inferior left calyx suggest developing staghorn calculus. No hydronephrosis on either side. No well-defined ureteral calculi. 4. Fluid in most loops of small bowel. Suspect a degree of ileus or enteritis. No bowel obstruction. No abscess evident in the abdomen or pelvis. 5. Small amount of free fluid in the cul-de-sac may be upper physiologic. Electronically Signed   By: Bretta Bang III M.D.   On: 02/08/2018 14:47   Dg Chest 1 View  Result Date: 02/08/2018 CLINICAL DATA:  Hypotension and tachycardia. Smoking history. Asthma. EXAM: CHEST  1 VIEW COMPARISON:  None. FINDINGS: Heart size is within normal limits. Mild prominence of the LEFT hilum. Patchy opacities bilaterally, most prominent at the RIGHT lung base, majority of which have central lucencies suggest cavitary lesions. IMPRESSION: 1. Patchy opacities bilaterally, majority of which have central lucencies suggest cavitary masses/infections. Corresponding cavitary consolidations noted at the lung bases of a CT abdomen performed at the time of today's chest x-ray. Recommend CT chest with contrast for  complete evaluation. 2. Questionable prominence of the LEFT hilum, additional cavitary mass/consolidation versus central bronchiectasis. Electronically Signed   By: Bary Richard M.D.   On: 02/08/2018 14:48   Dg Chest 2 View  Result Date: 02/24/2018 CLINICAL DATA:  Short of breath EXAM: CHEST - 2 VIEW COMPARISON:  02/20/2018 FINDINGS: Upper normal heart size. Stable left pleural effusion. Tiny right pleural effusion is stable. Patchy airspace opacities throughout both lungs are not significantly changed. No pneumothorax. IMPRESSION: Bilateral airspace disease and bilateral pleural effusions left greater than right are stable. Electronically Signed   By: Jolaine Click M.D.   On: 02/24/2018 15:50   Mr Lumbar Spine W Wo Contrast  Result Date: 02/26/2018 CLINICAL DATA:  Low back pain since prior abnormal MRI 02/08/2018. Unable to walk. EXAM: MRI LUMBAR SPINE WITHOUT AND WITH CONTRAST TECHNIQUE: Multiplanar and multiecho pulse sequences of the lumbar spine were obtained without and with intravenous contrast. CONTRAST:  7 mL Gadavist COMPARISON:  02/08/2018 FINDINGS: Segmentation:  Standard. Alignment:  Physiologic. Vertebrae:  No fracture, evidence of discitis, or bone lesion. Conus medullaris and cauda equina: Conus extends to the L1 level. Conus and cauda equina appear normal. Paraspinal and other soft tissues: Posterior paraspinal muscle edema bilaterally with mild enhancement on postcontrast imaging. No  intramuscular fluid collection or hematoma. Disc levels: Disc spaces: Disc spaces are maintained. T12-L1: No significant disc bulge. No evidence of neural foraminal stenosis. No central canal stenosis. L1-L2: No significant disc bulge. No evidence of neural foraminal stenosis. No central canal stenosis. L2-L3: No significant disc bulge. No evidence of neural foraminal stenosis. No central canal stenosis. L3-L4: No significant disc bulge. No evidence of neural foraminal stenosis. No central canal stenosis.  L4-L5: Minimal broad-based disc bulge. No evidence of neural foraminal stenosis. No central canal stenosis. L5-S1: Minimal broad-based disc bulge. No evidence of neural foraminal stenosis. No central canal stenosis. IMPRESSION: 1. No epidural fluid collection.  No discitis or osteomyelitis. 2. Posterior paraspinal muscle edema bilaterally with mild enhancement on postcontrast imaging. No intramuscular fluid collection or hematoma. Differential considerations include muscle strain versus mild myositis. Electronically Signed   By: Elige Ko   On: 02/26/2018 09:53   Dg Chest Port 1 View  Result Date: 03/01/2018 CLINICAL DATA:  Shortness of breath EXAM: PORTABLE CHEST 1 VIEW COMPARISON:  02/24/2018 FINDINGS: Cardiomegaly. Moderate to large bilateral pleural effusions, stable on the left, increasing on the right since prior study. Diffuse bilateral airspace disease has worsened since prior study and slightly more pronounced on the right. This could reflect asymmetric edema or pneumonia. IMPRESSION: Moderate to large bilateral pleural effusions, increasing on the right since prior study. Diffuse bilateral airspace disease, right greater than left, worsening since prior study. This could reflect asymmetric edema or pneumonia. Electronically Signed   By: Charlett Nose M.D.   On: 03/01/2018 18:14   Dg Chest Port 1 View  Result Date: 02/20/2018 CLINICAL DATA:  Sepsis, endocarditis, cavitary pneumonia with septic emboli and fever. EXAM: PORTABLE CHEST 1 VIEW COMPARISON:  02/08/2018 FINDINGS: Multiple cavitary lesions again identified in both lungs with associated airspace disease in a pattern likely representing septic emboli with multiple areas of cavitary infection. There is a new left lateral pleural effusion which appears likely loculated and may represent an empyema. Further evaluation with CT of the chest with contrast may be helpful. The heart size is stable. IMPRESSION: Persistent appearance of bilateral  cavitary pneumonia likely representing septic emboli. There is a new loculated left lateral pleural effusion that may represent a developing empyema. Further evaluation with CT of the chest with contrast may be helpful. Electronically Signed   By: Irish Lack M.D.   On: 02/20/2018 19:11   [place] Ekg Site Rite  Result Date: 02/26/2018 If Site Rite image not attached, placement could not be confirmed due to current cardiac rhythm.   Vassie Loll, MD [facility] Pager [phone]  03/10/2018, 2:33 PM   LOS: 30 days

## 2018-03-11 ENCOUNTER — Inpatient Hospital Stay (HOSPITAL_COMMUNITY): Payer: Self-pay

## 2018-03-11 LAB — BASIC METABOLIC PANEL
Anion gap: 8 (ref 5–15)
BUN: 6 mg/dL (ref 6–20)
CALCIUM: 7.9 mg/dL — AB (ref 8.9–10.3)
CO2: 31 mmol/L (ref 22–32)
Chloride: 95 mmol/L — ABNORMAL LOW (ref 98–111)
Creatinine, Ser: 0.43 mg/dL — ABNORMAL LOW (ref 0.44–1.00)
GFR calc Af Amer: >60 mL/min (ref >60)
GFR calc non Af Amer: >60 mL/min (ref >60)
Glucose, Bld: 105 mg/dL — ABNORMAL HIGH (ref 70–99)
Potassium: 4.3 mmol/L (ref 3.5–5.1)
Sodium: 134 mmol/L — ABNORMAL LOW (ref 135–145)

## 2018-03-11 NOTE — Progress Notes (Signed)
PROGRESS NOTE  Amy Mcknight XBJ:[id number] DOB: 1995/01/24 DOA: 02/08/2018 PCP: Patient, No Pcp Per  Brief History: 23 y.o.femalewith medical history significant forsubstance use disorderwho presents to the ED with 2 days of right lower back pain. Patient states symptoms began with acute onset while at rest. Symptoms have been persistent. She has noted associated diaphoresis, chills, shortness of breath, cough productive of brown sputum, and right chest wall pain. She denies any rashes or obvious skin changes. She reports good urine output without dysuria. She denies any abdominal pain, diarrhea, or constipation.  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 She continues to use recreational drugs by snorting nasally. She denies any alcohol use. She reports a history of withdrawal from opiates in the past.And is noted to be positive for marijuana and opiates on her UDS.  She is noted to be anemicand is status post 2 unit PRBC transfusion with improvement noted. No overt bleeding currently identified, but patient is complaining of some epigastric abdominal pain. As result of this pain, she has not been eating very much. She denies any nausea or vomiting.GI has evaluated patient with no need for endoscopy noted at this time. She intermittently refuses bathing and laboratory draws. She has been using a bedpan and has not been eating very well. She has been more somnolent from time to time possibly due to oversedation;  In the past week, pt was eating better and conversant and more compliant with medical therapy.  Lasix was started for fluid overload with +18kg since admission.  Repeat limited echo was obtained and showed some progression of her TR.  As a result cardiology was consulted to assist.  She is not a candidate for TV replacement/repair currently.  Assessment/Plan: Sepsis with early Septic shock -due toMSSA bacteremia, tricuspid  endocarditis, paravertebral myositis and cavitary pneumonia -sepsis pathophysiology and septic shock has resolved -Case discussedpreviouslywith infectious disease doctor -continue IV Ancef for total of 6 weeks from date of first negative culture which was 02/12/2018 in a supervised setting ([facility]) -2D echo positive for tricuspid vegetation and moderate regurgitation. 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 -remains afebrile and hemodynamically stable -continue Ancef.  Acute Diastolic CHF -pt +30 kg since admission -continueIV Lasix -follow daily weights and strict I's and O's -repeat CXR tomorrow am. -03/05/18--repeat limited echo--more significant prolapse of TV with severe TR; LVEF 60-65% -daily weights--NEG 3.3 kg since starting lasix -Some improvement appreciated new patient fluid overload -There has been also increasing her urine output with over 2 L negative overnight.  Will continue Lasix 60 mg every 12 hours and heart healthy/low-sodium diet with fluid restriction. -appreciate cardiology consult. -educated about importance on watching sodium intake.  Hypomagnesemia -repleted And started on maintenance supplementation.   Hypokalemia -repleted and started on daily maintenance. -follow trend   AKI (acute kidney injury) (HCC) -resolved -serum creatinine peaked 2.61 -will follow BMET intermittently; especially now that we are addressing fluid overload with IV diuresis..  Substance use disorder/opiate withdrawal syndrome/anxiety-she appears to be resting comfortably for extended/long periods of time without any evidence of pain, patient has been weaned off Dilaudid, -okay to  continuePRN low-dose oxycodone continuemethocarbamol  4 times daily, Cymbalta 30 mg twice daily and gabapentin 300 mg 3 times daily -  Discontinue hydroxyzine and continue using low-dose Klonopin up to  3 times a day as needed for anxiety. -UDS on 02/24/2018 shows opiates --- this was done after there was suspicion of possible inhalation/snorting of drugs in the room on the telemetry sitter camera -Dilaudid has been discontinued patient only has PRN oxycodone 5 mg at this time -she is out of the window for active withdrawal symptoms and will plan on avoiding any extra opioids.  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  -patient continues to refuse to participate in ADLs often, wanting to have staff do more and more for her -complaints of back pain as of 02/25/2018, patient currently has MSSA bacteremia and back pain--- lumbar MRI from 02/26/2018 without any evidence of epidural abscess or significant inflammation , (prior lumbar MRI from 02/08/2018 was without contrast as patient refused contrast at that time ) -OOB with meals -PT intermittently.  Anemia of chronic disease -suspecthemolysis in the setting of acute infection-stable -Anemia panelrevealed some iron deficiency and stool occult is negative -2 unit PRBC orderedon 02/14/18--- Hgb remains stable post prior transfusion -Appreciate GI evaluation with no recommendations for endoscopy at this time -Continue p.o. PPI, --Ferahemegiven during this admission -3/1-given dose ferrelicit -continue following Hgb trend  Depression/Anxiety---- -stable -continue cymbalta -may use hydroxyzine for sleep and anxiety    Disposition Plan: Home on  03/26/18 if stable; continue IV antibiotics. Family Communication:NoFamily at bedside  Consultants:ID  Code Status: FULL   DVT Prophylaxis: SCDs   Procedures: As Listed in Progress Note Above  Antibiotics: Cefazolin 2/7>>> with anticipated last dose on 03/26/2018    Subjective: Reporting feeling less anxious today.  No chest pain, no nausea, no vomiting, no abdominal pain.  Patient has remained afebrile.   Objective: Vitals:   03/10/18 2120 03/10/18 2213 03/11/18 0513 03/11/18 0604  BP:  (!) 122/96 (!) 123/92   Pulse:  (!) 108 99   Resp:  15 17   Temp:  97.6 F (36.4 C) 98.2 F (36.8 C)   TempSrc:  Oral Oral   SpO2: 92% 100% 99%   Weight:    70.2 kg  Height:        Intake/Output Summary (Last 24 hours) at 03/11/2018 0917 Last data filed at 03/11/2018 0655 Gross per 24 hour  Intake 1320 ml  Output 2100 ml  Net -780 ml   Weight change: -1.2 kg   Exam: General exam: Alert, awake, oriented x 3; no fever, no nausea, no vomiting, no abdominal pain.  Patient reports feeling less anxious.  Continued to have good urine output. Respiratory system: Positive for scattered rhonchi, mild bibasilar Rales, normal respiratory effort. Cardiovascular system: Regular rate and rhythm, no rubs, no gallops; positive soft systolic ejection murmur appreciated on exam. Gastrointestinal system: Abdomen is nondistended, soft and nontender. No organomegaly or masses felt. Normal bowel sounds heard. Central nervous system: Alert and oriented. No focal neurological deficits. Extremities: No cyanosis or clubbing.  1+ edema lower extremity bilaterally appreciated on exam. Skin: No rashes, lesions or ulcers Psychiatry: Judgement and insight appear normal. Mood & affect appropriate.    Data Reviewed: I have personally reviewed following labs and imaging studies.  Basic Metabolic Panel: Recent Labs  Lab 03/05/18 0635 03/06/18 0459 03/07/18 1818 03/09/18 0637  NA 136 137 131* 135   K 3.6  3.8 4.6 3.9  CL 100 100 96* 97*  CO2 28 29 27 31   GLUCOSE 93 97 134* 113*  BUN 7 8 8 6   CREATININE 0.43* 0.44 0.52 0.54  CALCIUM 8.3* 8.2* 8.1* 8.2*  MG  --  1.5* 1.7  --    Liver Function Tests: Recent Labs  Lab 03/05/18 0635  AST 29  ALT 10  ALKPHOS 117  BILITOT 0.2*  PROT 6.6  ALBUMIN 1.7*   CBC: Recent Labs  Lab 03/05/18 0635 03/07/18 1818 03/09/18 0637  WBC 12.6* 16.3* 14.4*  HGB 8.0* 8.4* 8.1*  HCT 28.1* 28.8* 27.9*  MCV 91.8 91.4 92.1  PLT 390 374 347   Urine analysis:    Component Value Date/Time   COLORURINE YELLOW 02/24/2018 1845   APPEARANCEUR HAZY (A) 02/24/2018 1845   LABSPEC 1.020 02/24/2018 1845   PHURINE 5.0 02/24/2018 1845   GLUCOSEU NEGATIVE 02/24/2018 1845   HGBUR MODERATE (A) 02/24/2018 1845   BILIRUBINUR NEGATIVE 02/24/2018 1845   KETONESUR NEGATIVE 02/24/2018 1845   PROTEINUR 30 (A) 02/24/2018 1845   NITRITE NEGATIVE 02/24/2018 1845   LEUKOCYTESUR SMALL (A) 02/24/2018 1845    Scheduled Meds: . diclofenac  1 patch Transdermal BID  . DULoxetine  30 mg Oral BID  . feeding supplement  1 Container Oral BID BM  . furosemide  60 mg Intravenous BID  . gabapentin  300 mg Oral TID  . magnesium oxide  400 mg Oral BID  . methocarbamol  750 mg Oral QID  . metoprolol tartrate  75 mg Oral BID  . multivitamin with minerals  1 tablet Oral Daily  . pantoprazole  40 mg Oral BID  . potassium chloride  20 mEq Oral Daily  . senna-docusate  2 tablet Oral BID  . sodium chloride flush  10-40 mL Intracatheter Q12H  . sodium chloride flush  3 mL Intravenous Q12H   Continuous Infusions: .  ceFAZolin (ANCEF) IV 2 g (03/11/18 0540)    Procedures/Studies: Dg Chest 2 View  Result Date: 02/24/2018 CLINICAL DATA:  Short of breath EXAM: CHEST - 2 VIEW COMPARISON:  02/20/2018 FINDINGS: Upper normal heart size. Stable left pleural effusion. Tiny right pleural effusion is stable. Patchy airspace opacities throughout both lungs are not significantly  changed. No pneumothorax. IMPRESSION: Bilateral airspace disease and bilateral pleural effusions left greater than right are stable. Electronically Signed   By: Jolaine Click M.D.   On: 02/24/2018 15:50   Mr Lumbar Spine W Wo Contrast  Result Date: 02/26/2018 CLINICAL DATA:  Low back pain since prior abnormal MRI 02/08/2018. Unable to walk. EXAM: MRI LUMBAR SPINE WITHOUT AND WITH CONTRAST TECHNIQUE: Multiplanar and multiecho pulse sequences of the lumbar spine were obtained without and with intravenous contrast. CONTRAST:  7 mL Gadavist COMPARISON:  02/08/2018 FINDINGS: Segmentation:  Standard. Alignment:  Physiologic. Vertebrae:  No fracture, evidence of discitis, or bone lesion. Conus medullaris and cauda equina: Conus extends to the L1 level. Conus and cauda equina appear normal. Paraspinal and other soft tissues: Posterior paraspinal muscle edema bilaterally with mild enhancement on postcontrast imaging. No intramuscular fluid collection or hematoma. Disc levels: Disc spaces: Disc spaces are maintained. T12-L1: No significant disc bulge. No evidence of neural foraminal stenosis. No central canal stenosis. L1-L2: No significant disc bulge. No evidence of neural foraminal stenosis. No central canal stenosis. L2-L3: No significant disc bulge. No evidence of neural foraminal stenosis. No central canal stenosis. L3-L4: No significant disc bulge. No evidence of neural foraminal stenosis. No central canal  stenosis. L4-L5: Minimal broad-based disc bulge. No evidence of neural foraminal stenosis. No central canal stenosis. L5-S1: Minimal broad-based disc bulge. No evidence of neural foraminal stenosis. No central canal stenosis. IMPRESSION: 1. No epidural fluid collection.  No discitis or osteomyelitis. 2. Posterior paraspinal muscle edema bilaterally with mild enhancement on postcontrast imaging. No intramuscular fluid collection or hematoma. Differential considerations include muscle strain versus mild myositis.  Electronically Signed   By: Elige Ko   On: 02/26/2018 09:53   Dg Chest Port 1 View  Result Date: 03/01/2018 CLINICAL DATA:  Shortness of breath EXAM: PORTABLE CHEST 1 VIEW COMPARISON:  02/24/2018 FINDINGS: Cardiomegaly. Moderate to large bilateral pleural effusions, stable on the left, increasing on the right since prior study. Diffuse bilateral airspace disease has worsened since prior study and slightly more pronounced on the right. This could reflect asymmetric edema or pneumonia. IMPRESSION: Moderate to large bilateral pleural effusions, increasing on the right since prior study. Diffuse bilateral airspace disease, right greater than left, worsening since prior study. This could reflect asymmetric edema or pneumonia. Electronically Signed   By: Charlett Nose M.D.   On: 03/01/2018 18:14   Dg Chest Port 1 View  Result Date: 02/20/2018 CLINICAL DATA:  Sepsis, endocarditis, cavitary pneumonia with septic emboli and fever. EXAM: PORTABLE CHEST 1 VIEW COMPARISON:  02/08/2018 FINDINGS: Multiple cavitary lesions again identified in both lungs with associated airspace disease in a pattern likely representing septic emboli with multiple areas of cavitary infection. There is a new left lateral pleural effusion which appears likely loculated and may represent an empyema. Further evaluation with CT of the chest with contrast may be helpful. The heart size is stable. IMPRESSION: Persistent appearance of bilateral cavitary pneumonia likely representing septic emboli. There is a new loculated left lateral pleural effusion that may represent a developing empyema. Further evaluation with CT of the chest with contrast may be helpful. Electronically Signed   By: Irish Lack M.D.   On: 02/20/2018 19:11   [place] Ekg Site Rite  Result Date: 02/26/2018 If Site Rite image not attached, placement could not be confirmed due to current cardiac rhythm.   Vassie Loll, MD [facility] Pager  [phone]  03/11/2018, 9:17 AM   LOS: 31 days

## 2018-03-11 NOTE — Progress Notes (Signed)
Pt is refusing morning lab draws for the phlebotomist and nursing staff. States she may let RN draw after lunch. Will continue to monitor and update MD as needed.

## 2018-03-12 LAB — BASIC METABOLIC PANEL
Anion gap: 8 (ref 5–15)
BUN: 6 mg/dL (ref 6–20)
CO2: 31 mmol/L (ref 22–32)
Calcium: 8.2 mg/dL — ABNORMAL LOW (ref 8.9–10.3)
Chloride: 96 mmol/L — ABNORMAL LOW (ref 98–111)
Creatinine, Ser: 0.41 mg/dL — ABNORMAL LOW (ref 0.44–1.00)
GFR calc Af Amer: >60 mL/min (ref >60)
GFR calc non Af Amer: >60 mL/min (ref >60)
Glucose, Bld: 106 mg/dL — ABNORMAL HIGH (ref 70–99)
POTASSIUM: 4.3 mmol/L (ref 3.5–5.1)
Sodium: 135 mmol/L (ref 135–145)

## 2018-03-12 NOTE — Progress Notes (Signed)
PT Cancellation Note  Patient Details Name: Amy Mcknight MRN: [id number] DOB: 03/05/1995   Cancelled Treatment:    Reason Eval/Treat Not Completed: Patient declined, no reason specified. Patient reported Rt LE hurting more today despite receiving pain medication an hour ago per her report. Patient eating a cold salad and requested therapist come back tomorrow.   Katina Dung. Hartnett-Rands, MS, PT [facility] [place] [phone] 03/12/2018, 11:58 AM

## 2018-03-12 NOTE — Clinical Social Work Note (Signed)
Patient is newly assigned to LCSW through [facility] assignment today.  LCSW met with patient (mother was at bedside).  Patient identified that she continues to experience leg pain. LCSW discussed with patient that she had about 14 more days of IV antibiotics and stressed the importance of her ambulating to regain her strength daily.  Patient agreed and stated that she would begin to walk daily and push through the pain. Patient's mother discussed that when patient is discharge, she will be going to reside with her aunt in [place], [place].  LCSW discussed that a change of atmosphere will assist with a change in lifestyle. LCSW discussed the need to become involved with behavioral health services and substance abuse services in the [place], [place]., area. Patient discussed that she would benefit from doing so.  LCSW discussed that she would begin to search for places that she may be able to have receive services with in that area that accept people without health insurance.  LCSW discussed the importance of patient receiving therapy and getting to the catalyst of what is driving her SA. Patient admitted that she had a need to be liked.  LCSW completed motivational interviewing techniques with patient. LCSW discussed that learning to say "no" and developing healthier relationship patterns both issues that could be addressed in therapy.  LCSW will follow up with patient and continue to seek resources in the [place] area.      Shey Bartmess, Clydene Pugh, LCSW

## 2018-03-12 NOTE — Progress Notes (Signed)
PROGRESS NOTE  Amy Mcknight GNF:[id number] DOB: [birth year] DOA: 02/08/2018 PCP: Patient, No Pcp Per  Brief History: 23 y.o.femalewith medical history significant forsubstance use disorderwho presents to the ED with 2 days of right lower back pain. Patient states symptoms began with acute onset while at rest. Symptoms have been persistent. She has noted associated diaphoresis, chills, shortness of breath, cough productive of brown sputum, and right chest wall pain. She denies any rashes or obvious skin changes. She reports good urine output without dysuria. She denies any abdominal pain, diarrhea, or constipation.  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 She continues to use recreational drugs by snorting nasally. She denies any alcohol use. She reports a history of withdrawal from opiates in the past.And is noted to be positive for marijuana and opiates on her UDS.  She is noted to be anemicand is status post 2 unit PRBC transfusion with improvement noted. No overt bleeding currently identified, but patient is complaining of some epigastric abdominal pain. As result of this pain, she has not been eating very much. She denies any nausea or vomiting.GI has evaluated patient with no need for endoscopy noted at this time. She intermittently refuses bathing and laboratory draws. She has been using a bedpan and has not been eating very well. She has been more somnolent from time to time possibly due to oversedation;  In the past week, pt was eating better and conversant and more compliant with medical therapy.  Lasix was started for fluid overload with +18kg since admission.  Repeat limited echo was obtained and showed some progression of her TR.  As a result cardiology was consulted to assist.  She is not a candidate for TV replacement/repair currently.  Assessment/Plan: Sepsis with early Septic shock -due toMSSA bacteremia, tricuspid  endocarditis, paravertebral myositis and cavitary pneumonia -sepsis pathophysiology and septic shock has resolved -Case discussedpreviouslywith infectious disease doctor -continue IV Ancef for total of 6 weeks from date of first negative culture which was 02/12/2018 in a supervised setting ([facility]) -2D echo positive for tricuspid vegetation and moderate regurgitation. 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 -remains afebrile and hemodynamically stable -continue Ancef.  Acute Diastolic CHF -pt +84 kg since admission -continueIV Lasix -follow daily weights and strict I's and O's -repeated CXR still demonstrating vascular congestion and pleural effusion. -03/05/18--repeat limited echo--more significant prolapse of TV with severe TR; LVEF 60-65% -daily weights--NEG 3.3 kg since starting lasix -Some improvement appreciated new patient fluid overload -There has been also increasing her urine output with over 2 L negative overnight.  Will continue Lasix 60 mg every 12 hours and heart healthy/low-sodium diet with fluid restriction. -appreciate cardiology consult. -educated about importance on watching sodium intake.  Hypomagnesemia -repleted And started on maintenance supplementation.   Hypokalemia -repleted and started on daily maintenance. -follow trend   AKI (acute kidney injury) (HCC) -resolved -serum creatinine peaked 2.61 -will follow BMET intermittently; especially now that we are addressing fluid overload with IV diuresis..  Substance use disorder/opiate withdrawal syndrome/anxiety-she appears to be resting comfortably for extended/long periods of time without any evidence of pain,  patient has been weaned off Dilaudid, -okay to continuePRN low-dose oxycodone continuemethocarbamol  4 times daily, Cymbalta 30 mg twice daily and gabapentin  300 mg 3 times daily -Discontinue hydroxyzine and continue using low-dose Klonopin up to  3 times a day as needed for anxiety. -UDS on 02/24/2018 shows opiates --- this was done after there was suspicion of possible inhalation/snorting of drugs in the room on the telemetry sitter camera -Dilaudid has been discontinued patient only has PRN oxycodone 5 mg at this time -she is out of the window for active withdrawal symptoms and will plan on avoiding any extra opioids.  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  -patient continues to refuse to participate in ADLs often, wanting to have staff do more and more for her -complaints of back pain as of 02/25/2018, patient currently has MSSA bacteremia and back pain--- lumbar MRI from 02/26/2018 without any evidence of epidural abscess or significant inflammation , (prior lumbar MRI from 02/08/2018 was without contrast as patient refused contrast at that time ) -OOB with meals -PT intermittently.  Anemia of chronic disease -suspecthemolysis in the setting of acute infection-stable -Anemia panelrevealed some iron deficiency and stool occult is negative -2 unit PRBC orderedon 02/14/18--- Hgb remains stable post prior transfusion -Appreciate GI evaluation with no recommendations for endoscopy at this time -Continue p.o. PPI, --Ferahemegiven during this admission -3/1-given dose ferrelicit -continue following Hgb trend  Depression/Anxiety---- -stable -continue cymbalta -may use hydroxyzine for sleep  and anxiety    Disposition Plan: Home on 03/26/18 if stable; continue IV antibiotics. Family Communication:NoFamily at bedside  Consultants:ID  Code Status: FULL   DVT Prophylaxis: SCDs   Procedures: As Listed in Progress Note Above  Antibiotics: Cefazolin 2/7>>> with anticipated last dose on 03/26/2018   Subjective: No chest pain, no nausea, no vomiting.  Still with signs of fluid overload.  Patient is afebrile.   Objective: Vitals:   03/11/18 1315 03/11/18 2124 03/11/18 2204 03/12/18 0524  BP: (!) 120/95  112/89 117/88  Pulse: (!) 106  (!) 108 99  Resp: 19  18   Temp: 98.7 F (37.1 C)  97.8 F (36.6 C) 98.5 F (36.9 C)  TempSrc: Oral  Oral Oral  SpO2: 98% 98% 100% 100%  Weight:    69.6 kg  Height:        Intake/Output Summary (Last 24 hours) at 03/12/2018 4765 Last data filed at 03/11/2018 1227 Gross per 24 hour  Intake 240 ml  Output -  Net 240 ml   Weight change: -0.6 kg   Exam: General exam: Alert, awake, oriented x 3; no fever, no nausea, no vomiting, no abdominal pain.  Still with signs of fluid overload on exam. Respiratory system: Positive scattered rhonchi right, mild bibasilar Rales, normal respiratory effort. Cardiovascular system: Slight tachycardia, no rubs, no gallops; positive systolic ejection murmur.   Gastrointestinal system: Abdomen is nondistended, soft and nontender. No organomegaly or masses felt. Normal bowel sounds heard. Central nervous system: Alert and oriented. No focal neurological deficits. Extremities: No cyanosis or clubbing; 1+ edema lower extremity bilaterally. Skin: No rashes, lesions or ulcers Psychiatry: Judgement and insight appear normal. Mood & affect appropriate.   Data Reviewed: I have personally reviewed following labs and imaging studies.  Basic Metabolic Panel: Recent Labs  Lab 03/06/18 0459 03/07/18 1818 03/09/18 0637 03/11/18 1318 03/12/18 0447  NA 137 131* 135 134* 135  K 3.8 4.6 3.9  4.3 4.3  CL 100  96* 97* 95* 96*  CO2 29 27 31 31 31   GLUCOSE 97 134* 113* 105* 106*  BUN 8 8 6 6 6   CREATININE 0.44 0.52 0.54 0.43* 0.41*  CALCIUM 8.2* 8.1* 8.2* 7.9* 8.2*  MG 1.5* 1.7  --   --   --    CBC: Recent Labs  Lab 03/07/18 1818 03/09/18 0637  WBC 16.3* 14.4*  HGB 8.4* 8.1*  HCT 28.8* 27.9*  MCV 91.4 92.1  PLT 374 347   Urine analysis:    Component Value Date/Time   COLORURINE YELLOW 02/24/2018 1845   APPEARANCEUR HAZY (A) 02/24/2018 1845   LABSPEC 1.020 02/24/2018 1845   PHURINE 5.0 02/24/2018 1845   GLUCOSEU NEGATIVE 02/24/2018 1845   HGBUR MODERATE (A) 02/24/2018 1845   BILIRUBINUR NEGATIVE 02/24/2018 1845   KETONESUR NEGATIVE 02/24/2018 1845   PROTEINUR 30 (A) 02/24/2018 1845   NITRITE NEGATIVE 02/24/2018 1845   LEUKOCYTESUR SMALL (A) 02/24/2018 1845    Scheduled Meds: . diclofenac  1 patch Transdermal BID  . DULoxetine  30 mg Oral BID  . feeding supplement  1 Container Oral BID BM  . furosemide  60 mg Intravenous BID  . gabapentin  300 mg Oral TID  . magnesium oxide  400 mg Oral BID  . methocarbamol  750 mg Oral QID  . metoprolol tartrate  75 mg Oral BID  . multivitamin with minerals  1 tablet Oral Daily  . pantoprazole  40 mg Oral BID  . potassium chloride  20 mEq Oral Daily  . senna-docusate  2 tablet Oral BID  . sodium chloride flush  10-40 mL Intracatheter Q12H  . sodium chloride flush  3 mL Intravenous Q12H   Continuous Infusions: .  ceFAZolin (ANCEF) IV 2 g (03/12/18 0549)    Procedures/Studies: Dg Chest 2 View  Result Date: 03/11/2018 CLINICAL DATA:  Bacteremia.  Drug abuse. EXAM: CHEST - 2 VIEW COMPARISON:  March 01, 2018 FINDINGS: The right PICC line terminates in the SVC, unchanged. Bilateral patchy pulmonary infiltrates persist with moderate bilateral pleural effusions, stable. The cardiomediastinal silhouette is unchanged. IMPRESSION: Persistent bilateral pleural effusions and patchy bilateral pulmonary infiltrates. Electronically  Signed   By: Gerome Sam III M.D   On: 03/11/2018 15:54   Dg Chest 2 View  Result Date: 02/24/2018 CLINICAL DATA:  Short of breath EXAM: CHEST - 2 VIEW COMPARISON:  02/20/2018 FINDINGS: Upper normal heart size. Stable left pleural effusion. Tiny right pleural effusion is stable. Patchy airspace opacities throughout both lungs are not significantly changed. No pneumothorax. IMPRESSION: Bilateral airspace disease and bilateral pleural effusions left greater than right are stable. Electronically Signed   By: Jolaine Click M.D.   On: 02/24/2018 15:50   Mr Lumbar Spine W Wo Contrast  Result Date: 02/26/2018 CLINICAL DATA:  Low back pain since prior abnormal MRI 02/08/2018. Unable to walk. EXAM: MRI LUMBAR SPINE WITHOUT AND WITH CONTRAST TECHNIQUE: Multiplanar and multiecho pulse sequences of the lumbar spine were obtained without and with intravenous contrast. CONTRAST:  7 mL Gadavist COMPARISON:  02/08/2018 FINDINGS: Segmentation:  Standard. Alignment:  Physiologic. Vertebrae:  No fracture, evidence of discitis, or bone lesion. Conus medullaris and cauda equina: Conus extends to the L1 level. Conus and cauda equina appear normal. Paraspinal and other soft tissues: Posterior paraspinal muscle edema bilaterally with mild enhancement on postcontrast imaging. No intramuscular fluid collection or hematoma. Disc levels: Disc spaces: Disc spaces are maintained. T12-L1: No significant disc bulge. No evidence of neural foraminal stenosis. No central canal  stenosis. L1-L2: No significant disc bulge. No evidence of neural foraminal stenosis. No central canal stenosis. L2-L3: No significant disc bulge. No evidence of neural foraminal stenosis. No central canal stenosis. L3-L4: No significant disc bulge. No evidence of neural foraminal stenosis. No central canal stenosis. L4-L5: Minimal broad-based disc bulge. No evidence of neural foraminal stenosis. No central canal stenosis. L5-S1: Minimal broad-based disc bulge. No  evidence of neural foraminal stenosis. No central canal stenosis. IMPRESSION: 1. No epidural fluid collection.  No discitis or osteomyelitis. 2. Posterior paraspinal muscle edema bilaterally with mild enhancement on postcontrast imaging. No intramuscular fluid collection or hematoma. Differential considerations include muscle strain versus mild myositis. Electronically Signed   By: Elige KoHetal  Patel   On: 02/26/2018 09:53   Dg Chest Port 1 View  Result Date: 03/01/2018 CLINICAL DATA:  Shortness of breath EXAM: PORTABLE CHEST 1 VIEW COMPARISON:  02/24/2018 FINDINGS: Cardiomegaly. Moderate to large bilateral pleural effusions, stable on the left, increasing on the right since prior study. Diffuse bilateral airspace disease has worsened since prior study and slightly more pronounced on the right. This could reflect asymmetric edema or pneumonia. IMPRESSION: Moderate to large bilateral pleural effusions, increasing on the right since prior study. Diffuse bilateral airspace disease, right greater than left, worsening since prior study. This could reflect asymmetric edema or pneumonia. Electronically Signed   By: Charlett NoseKevin  Dover M.D.   On: 03/01/2018 18:14   Dg Chest Port 1 View  Result Date: 02/20/2018 CLINICAL DATA:  Sepsis, endocarditis, cavitary pneumonia with septic emboli and fever. EXAM: PORTABLE CHEST 1 VIEW COMPARISON:  02/08/2018 FINDINGS: Multiple cavitary lesions again identified in both lungs with associated airspace disease in a pattern likely representing septic emboli with multiple areas of cavitary infection. There is a new left lateral pleural effusion which appears likely loculated and may represent an empyema. Further evaluation with CT of the chest with contrast may be helpful. The heart size is stable. IMPRESSION: Persistent appearance of bilateral cavitary pneumonia likely representing septic emboli. There is a new loculated left lateral pleural effusion that may represent a developing empyema.  Further evaluation with CT of the chest with contrast may be helpful. Electronically Signed   By: Irish LackGlenn  Yamagata M.D.   On: 02/20/2018 19:11   [place] Ekg Site Rite  Result Date: 02/26/2018 If Site Rite image not attached, placement could not be confirmed due to current cardiac rhythm.   Vassie Lollarlos Ezrah Panning, MD [facility] Pager [phone]  03/12/2018, 8:32 AM   LOS: 32 days

## 2018-03-13 LAB — BASIC METABOLIC PANEL
ANION GAP: 7 (ref 5–15)
BUN: 5 mg/dL — ABNORMAL LOW (ref 6–20)
CO2: 30 mmol/L (ref 22–32)
Calcium: 8.3 mg/dL — ABNORMAL LOW (ref 8.9–10.3)
Chloride: 98 mmol/L (ref 98–111)
Creatinine, Ser: 0.4 mg/dL — ABNORMAL LOW (ref 0.44–1.00)
GFR calc Af Amer: >60 mL/min (ref >60)
GFR calc non Af Amer: >60 mL/min (ref >60)
Glucose, Bld: 118 mg/dL — ABNORMAL HIGH (ref 70–99)
POTASSIUM: 3.9 mmol/L (ref 3.5–5.1)
Sodium: 135 mmol/L (ref 135–145)

## 2018-03-13 MED ORDER — OXYCODONE HCL 5 MG PO TABS
10.0000 mg | ORAL_TABLET | Freq: Three times a day (TID) | ORAL | Status: DC | PRN
  Administered 2018-03-13 – 2018-03-14 (×2): 10 mg via ORAL
  Filled 2018-03-13 (×2): qty 2

## 2018-03-13 MED ORDER — OXYCODONE HCL 5 MG PO TABS
5.0000 mg | ORAL_TABLET | Freq: Once | ORAL | Status: AC
  Administered 2018-03-13: 5 mg via ORAL
  Filled 2018-03-13: qty 1

## 2018-03-13 NOTE — Progress Notes (Signed)
Asked to take 1800 dose of lasix 60 mg at three separate times.  Notified Dr. Gwenlyn Perking that patient refused her 60 mg dose after telling her she needed the medication

## 2018-03-13 NOTE — Clinical Social Work Note (Signed)
Patient stated that she spoke with attending about increasing her pain medication due to her constant leg pain. She stated that attending agreed and once the medication had been increased she would get out of bed and walk as her pain level would be reduced.    Leather Estis, Juleen China, LCSW

## 2018-03-13 NOTE — Progress Notes (Signed)
Patient has refused Lasix this morning. She requested tylenol for back pain and it was given at 0900. MD in to speak to her and advised her to get OOB today. Patient stated, "I will if I feel like it".

## 2018-03-13 NOTE — Progress Notes (Addendum)
PROGRESS NOTE  Amy Mcknight GNF:[id number] DOB: 29-Mar-1995 DOA: 02/08/2018 PCP: Patient, No Pcp Per  Brief History: 23 y.o.femalewith medical history significant forsubstance use disorderwho presents to the ED with 2 days of right lower back pain. Patient states symptoms began with acute onset while at rest. Symptoms have been persistent. She has noted associated diaphoresis, chills, shortness of breath, cough productive of brown sputum, and right chest wall pain. She denies any rashes or obvious skin changes. She reports good urine output without dysuria. She denies any abdominal pain, diarrhea, or constipation.  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 She continues to use recreational drugs by snorting nasally. She denies any alcohol use. She reports a history of withdrawal from opiates in the past.And is noted to be positive for marijuana and opiates on her UDS.  She is noted to be anemicand is status post 2 unit PRBC transfusion with improvement noted. No overt bleeding currently identified, but patient is complaining of some epigastric abdominal pain. As result of this pain, she has not been eating very much. She denies any nausea or vomiting.GI has evaluated patient with no need for endoscopy noted at this time. She intermittently refuses bathing and laboratory draws. She has been using a bedpan and has not been eating very well. She has been more somnolent from time to time possibly due to oversedation;  In the past week, pt was eating better and conversant and more compliant with medical therapy.  Lasix was started for fluid overload with +18kg since admission.  Repeat limited echo was obtained and showed some progression of her TR.  As a result cardiology was consulted to assist.  She is not a candidate for TV replacement/repair currently.  Assessment/Plan: Sepsis with early Septic shock -due toMSSA bacteremia, tricuspid  endocarditis, paravertebral myositis and cavitary pneumonia -sepsis pathophysiology and septic shock has resolved -Case discussedpreviouslywith infectious disease doctor -continue IV Ancef for total of 6 weeks from date of first negative culture which was 02/12/2018 in a supervised setting ([facility]) -2D echo positive for tricuspid vegetation and moderate regurgitation. 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 -remains afebrile and hemodynamically stable -continue Ancef.  Acute Diastolic CHF -pt +84 kg since admission -continueIV Lasix -follow daily weights and strict I's and O's -repeated CXR still demonstrating vascular congestion and pleural effusion. -03/05/18--repeat limited echo--more significant prolapse of TV with severe TR; LVEF 60-65% -daily weights--NEG 3.3 kg since starting lasix -Some improvement appreciated new patient fluid overload -There has been also increasing her urine output with over 2 L negative overnight.  Will continue Lasix 60 mg every 12 hours and heart healthy/low-sodium diet with fluid restriction. -appreciate cardiology consult. -educated about importance on watching sodium intake.  Hypomagnesemia -repleted And started on maintenance supplementation.   Hypokalemia -repleted and started on daily maintenance. -follow trend   AKI (acute kidney injury) (HCC) -resolved -serum creatinine peaked 2.61 -will follow BMET intermittently; especially now that we are addressing fluid overload with IV diuresis..  Substance use disorder/opiate withdrawal syndrome/anxiety-she appears to be resting comfortably for extended/long periods of time without any evidence of pain,  patient has been weaned off Dilaudid, -okay to continuePRN low-dose oxycodone continuemethocarbamol  4 times daily, Cymbalta 30 mg twice daily and gabapentin  300 mg 3 times daily -Discontinue hydroxyzine and continue using low-dose Klonopin up to  3 times a day as needed for anxiety. -UDS on 02/24/2018 shows opiates --- this was done after there was suspicion of possible inhalation/snorting of drugs in the room on the telemetry sitter camera -Dilaudid has been discontinued patient only has PRN oxycodone 10 mg q8hr PRN at this time. -she is out of the window for active withdrawal symptoms and will plan on avoiding any extra opioids; especially no IV narcotics..  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  -patient continues to refuse to participate in ADLs often, wanting to have staff do more and more for her -complaints of back pain as of 02/25/2018, patient currently has MSSA bacteremia and back pain--- lumbar MRI from 02/26/2018 without any evidence of epidural abscess or significant inflammation , (prior lumbar MRI from 02/08/2018 was without contrast as patient refused contrast at that time ) -OOB with meals -PT intermittently.  Anemia of chronic disease -suspecthemolysis in the setting of acute infection-stable -Anemia panelrevealed some iron deficiency and stool occult is negative -2 unit PRBC orderedon 02/14/18--- Hgb remains stable post prior transfusion -Appreciate GI evaluation with no recommendations for endoscopy at this time -Continue p.o. PPI, --Ferahemegiven during this admission -3/1-given dose ferrelicit -continue following Hgb trend  Depression/Anxiety---- -stable -continue  cymbalta -may use hydroxyzine for sleep and anxiety    Disposition Plan: Home on 03/26/18 if stable; continue IV antibiotics. Family Communication:NoFamily at bedside  Consultants:ID  Code Status: FULL   DVT Prophylaxis: SCDs   Procedures: As Listed in Progress Note Above  Antibiotics: Cefazolin 2/7>>> with anticipated last dose on 03/26/2018   Subjective: Still with signs of fluid overload.  Continue having intermittent episode of back pain.  No nausea, no vomiting, no chest pain, no abdominal pain.  Reports good urine output.  Patient no happy with low-sodium diet and intermittently refusing IV Lasix (as she doesn't to get up and pee that much).   Objective: Vitals:   03/12/18 1019 03/12/18 2116 03/13/18 0545 03/13/18 1021  BP:  (!) 126/104 (!) 113/94 (!) 127/94  Pulse: (!) 110 (!) 101 99 (!) 112  Resp:  18 17   Temp:  98.3 F (36.8 C) 97.8 F (36.6 C) 99 F (37.2 C)  TempSrc:   Oral Oral  SpO2:  100% 100% 99%  Weight:   69.6 kg   Height:        Intake/Output Summary (Last 24 hours) at 03/13/2018 1238 Last data filed at 03/13/2018 0900 Gross per 24 hour  Intake 720 ml  Output 900 ml  Net -180 ml   Weight change: 0 kg   Exam: General exam: Alert, awake, oriented x 3; no fever, no nausea, no vomiting, no abdominal pain.  Continued to experience intermittent episode of back pain and is having signs of fluid overload.  Patient reports good urine output.  Has been refusing some of her Lasix dosages and is now happy with low-sodium diet. Respiratory system: Scattered bibasilar rales, no using accessory muscles, no wheezing. Cardiovascular system: Slight tachycardia, positive systolic ejection murmur, no rubs, no gallops. Gastrointestinal system: Abdomen is nondistended, soft and nontender. No organomegaly or masses felt. Normal bowel sounds heard. Central nervous system: Alert and oriented. No focal neurological deficits. Extremities: No cyanosis  or  clubbing.  1+ edema lower extremity bilaterally appreciated. Skin: No rashes, lesions or ulcers Psychiatry: Judgement and insight appear normal. Mood & affect appropriate.  appropriate.   Data Reviewed: I have personally reviewed following labs and imaging studies.  Basic Metabolic Panel: Recent Labs  Lab 03/07/18 1818 03/09/18 0637 03/11/18 1318 03/12/18 0447 03/13/18 0441  NA 131* 135 134* 135 135  K 4.6 3.9 4.3 4.3 3.9  CL 96* 97* 95* 96* 98  CO2 27 31 31 31 30   GLUCOSE 134* 113* 105* 106* 118*  BUN 8 6 6 6  5*  CREATININE 0.52 0.54 0.43* 0.41* 0.40*  CALCIUM 8.1* 8.2* 7.9* 8.2* 8.3*  MG 1.7  --   --   --   --    CBC: Recent Labs  Lab 03/07/18 1818 03/09/18 0637  WBC 16.3* 14.4*  HGB 8.4* 8.1*  HCT 28.8* 27.9*  MCV 91.4 92.1  PLT 374 347   Urine analysis:    Component Value Date/Time   COLORURINE YELLOW 02/24/2018 1845   APPEARANCEUR HAZY (A) 02/24/2018 1845   LABSPEC 1.020 02/24/2018 1845   PHURINE 5.0 02/24/2018 1845   GLUCOSEU NEGATIVE 02/24/2018 1845   HGBUR MODERATE (A) 02/24/2018 1845   BILIRUBINUR NEGATIVE 02/24/2018 1845   KETONESUR NEGATIVE 02/24/2018 1845   PROTEINUR 30 (A) 02/24/2018 1845   NITRITE NEGATIVE 02/24/2018 1845   LEUKOCYTESUR SMALL (A) 02/24/2018 1845    Scheduled Meds: . DULoxetine  30 mg Oral BID  . feeding supplement  1 Container Oral BID BM  . furosemide  60 mg Intravenous BID  . gabapentin  300 mg Oral TID  . magnesium oxide  400 mg Oral BID  . methocarbamol  750 mg Oral QID  . metoprolol tartrate  75 mg Oral BID  . multivitamin with minerals  1 tablet Oral Daily  . pantoprazole  40 mg Oral BID  . potassium chloride  20 mEq Oral Daily  . senna-docusate  2 tablet Oral BID  . sodium chloride flush  10-40 mL Intracatheter Q12H  . sodium chloride flush  3 mL Intravenous Q12H   Continuous Infusions: .  ceFAZolin (ANCEF) IV 2 g (03/13/18 0545)    Procedures/Studies: Dg Chest 2 View  Result Date: 03/11/2018 CLINICAL  DATA:  Bacteremia.  Drug abuse. EXAM: CHEST - 2 VIEW COMPARISON:  March 01, 2018 FINDINGS: The right PICC line terminates in the SVC, unchanged. Bilateral patchy pulmonary infiltrates persist with moderate bilateral pleural effusions, stable. The cardiomediastinal silhouette is unchanged. IMPRESSION: Persistent bilateral pleural effusions and patchy bilateral pulmonary infiltrates. Electronically Signed   By: Gerome Samavid  Williams III M.D   On: 03/11/2018 15:54   Dg Chest 2 View  Result Date: 02/24/2018 CLINICAL DATA:  Short of breath EXAM: CHEST - 2 VIEW COMPARISON:  02/20/2018 FINDINGS: Upper normal heart size. Stable left pleural effusion. Tiny right pleural effusion is stable. Patchy airspace opacities throughout both lungs are not significantly changed. No pneumothorax. IMPRESSION: Bilateral airspace disease and bilateral pleural effusions left greater than right are stable. Electronically Signed   By: Jolaine ClickArthur  Hoss M.D.   On: 02/24/2018 15:50   Mr Lumbar Spine W Wo Contrast  Result Date: 02/26/2018 CLINICAL DATA:  Low back pain since prior abnormal MRI 02/08/2018. Unable to walk. EXAM: MRI LUMBAR SPINE WITHOUT AND WITH CONTRAST TECHNIQUE: Multiplanar and multiecho pulse sequences of the lumbar spine were obtained without and with intravenous contrast. CONTRAST:  7 mL Gadavist COMPARISON:  02/08/2018 FINDINGS: Segmentation:  Standard. Alignment:  Physiologic. Vertebrae:  No  fracture, evidence of discitis, or bone lesion. Conus medullaris and cauda equina: Conus extends to the L1 level. Conus and cauda equina appear normal. Paraspinal and other soft tissues: Posterior paraspinal muscle edema bilaterally with mild enhancement on postcontrast imaging. No intramuscular fluid collection or hematoma. Disc levels: Disc spaces: Disc spaces are maintained. T12-L1: No significant disc bulge. No evidence of neural foraminal stenosis. No central canal stenosis. L1-L2: No significant disc bulge. No evidence of neural  foraminal stenosis. No central canal stenosis. L2-L3: No significant disc bulge. No evidence of neural foraminal stenosis. No central canal stenosis. L3-L4: No significant disc bulge. No evidence of neural foraminal stenosis. No central canal stenosis. L4-L5: Minimal broad-based disc bulge. No evidence of neural foraminal stenosis. No central canal stenosis. L5-S1: Minimal broad-based disc bulge. No evidence of neural foraminal stenosis. No central canal stenosis. IMPRESSION: 1. No epidural fluid collection.  No discitis or osteomyelitis. 2. Posterior paraspinal muscle edema bilaterally with mild enhancement on postcontrast imaging. No intramuscular fluid collection or hematoma. Differential considerations include muscle strain versus mild myositis. Electronically Signed   By: Elige Ko   On: 02/26/2018 09:53   Dg Chest Port 1 View  Result Date: 03/01/2018 CLINICAL DATA:  Shortness of breath EXAM: PORTABLE CHEST 1 VIEW COMPARISON:  02/24/2018 FINDINGS: Cardiomegaly. Moderate to large bilateral pleural effusions, stable on the left, increasing on the right since prior study. Diffuse bilateral airspace disease has worsened since prior study and slightly more pronounced on the right. This could reflect asymmetric edema or pneumonia. IMPRESSION: Moderate to large bilateral pleural effusions, increasing on the right since prior study. Diffuse bilateral airspace disease, right greater than left, worsening since prior study. This could reflect asymmetric edema or pneumonia. Electronically Signed   By: Charlett Nose M.D.   On: 03/01/2018 18:14   Dg Chest Port 1 View  Result Date: 02/20/2018 CLINICAL DATA:  Sepsis, endocarditis, cavitary pneumonia with septic emboli and fever. EXAM: PORTABLE CHEST 1 VIEW COMPARISON:  02/08/2018 FINDINGS: Multiple cavitary lesions again identified in both lungs with associated airspace disease in a pattern likely representing septic emboli with multiple areas of cavitary infection.  There is a new left lateral pleural effusion which appears likely loculated and may represent an empyema. Further evaluation with CT of the chest with contrast may be helpful. The heart size is stable. IMPRESSION: Persistent appearance of bilateral cavitary pneumonia likely representing septic emboli. There is a new loculated left lateral pleural effusion that may represent a developing empyema. Further evaluation with CT of the chest with contrast may be helpful. Electronically Signed   By: Irish Lack M.D.   On: 02/20/2018 19:11   [place] Ekg Site Rite  Result Date: 02/26/2018 If Site Rite image not attached, placement could not be confirmed due to current cardiac rhythm.   Vassie Loll, MD [facility] Pager [phone]  03/13/2018, 12:38 PM   LOS: 33 days

## 2018-03-14 DIAGNOSIS — D649 Anemia, unspecified: Secondary | ICD-10-CM

## 2018-03-14 LAB — CBC WITH DIFFERENTIAL/PLATELET
Abs Immature Granulocytes: 0.07 10*3/uL (ref 0.00–0.07)
Basophils Absolute: 0.1 10*3/uL (ref 0.0–0.1)
Basophils Relative: 1 %
Eosinophils Absolute: 0.2 10*3/uL (ref 0.0–0.5)
Eosinophils Relative: 2 %
HEMATOCRIT: 31 % — AB (ref 36.0–46.0)
Hemoglobin: 8.7 g/dL — ABNORMAL LOW (ref 12.0–15.0)
Immature Granulocytes: 1 %
LYMPHS ABS: 4.2 10*3/uL — AB (ref 0.7–4.0)
Lymphocytes Relative: 37 %
MCH: 26.6 pg (ref 26.0–34.0)
MCHC: 28.1 g/dL — AB (ref 30.0–36.0)
MCV: 94.8 fL (ref 80.0–100.0)
Monocytes Absolute: 0.9 10*3/uL (ref 0.1–1.0)
Monocytes Relative: 8 %
Neutro Abs: 6 10*3/uL (ref 1.7–7.7)
Neutrophils Relative %: 51 %
Platelets: 337 10*3/uL (ref 150–400)
RBC: 3.27 MIL/uL — ABNORMAL LOW (ref 3.87–5.11)
RDW: 19.7 % — ABNORMAL HIGH (ref 11.5–15.5)
WBC: 11.4 10*3/uL — ABNORMAL HIGH (ref 4.0–10.5)
nRBC: 0.2 % (ref 0.0–0.2)

## 2018-03-14 LAB — BASIC METABOLIC PANEL
Anion gap: 7 (ref 5–15)
BUN: 6 mg/dL (ref 6–20)
CO2: 29 mmol/L (ref 22–32)
Calcium: 8.5 mg/dL — ABNORMAL LOW (ref 8.9–10.3)
Chloride: 99 mmol/L (ref 98–111)
Creatinine, Ser: 0.4 mg/dL — ABNORMAL LOW (ref 0.44–1.00)
GFR calc non Af Amer: >60 mL/min (ref >60)
Glucose, Bld: 119 mg/dL — ABNORMAL HIGH (ref 70–99)
Potassium: 3.9 mmol/L (ref 3.5–5.1)
Sodium: 135 mmol/L (ref 135–145)

## 2018-03-14 LAB — RAPID URINE DRUG SCREEN, HOSP PERFORMED
Amphetamines: NOT DETECTED
Barbiturates: NOT DETECTED
Benzodiazepines: POSITIVE — AB
Cocaine: NOT DETECTED
Opiates: POSITIVE — AB
Tetrahydrocannabinol: NOT DETECTED

## 2018-03-14 MED ORDER — FERROUS SULFATE 325 (65 FE) MG PO TABS
325.0000 mg | ORAL_TABLET | Freq: Every day | ORAL | Status: DC
  Administered 2018-03-15 – 2018-03-25 (×11): 325 mg via ORAL
  Filled 2018-03-14 (×12): qty 1

## 2018-03-14 MED ORDER — FUROSEMIDE 40 MG PO TABS
40.0000 mg | ORAL_TABLET | Freq: Two times a day (BID) | ORAL | Status: DC
  Administered 2018-03-15 – 2018-03-25 (×14): 40 mg via ORAL
  Filled 2018-03-14 (×22): qty 1

## 2018-03-14 MED ORDER — CLONAZEPAM 0.5 MG PO TABS
0.2500 mg | ORAL_TABLET | Freq: Three times a day (TID) | ORAL | Status: DC | PRN
  Administered 2018-03-15 – 2018-03-17 (×3): 0.25 mg via ORAL
  Filled 2018-03-14 (×6): qty 1

## 2018-03-14 MED ORDER — BUPRENORPHINE HCL-NALOXONE HCL 8-2 MG SL SUBL
1.0000 | SUBLINGUAL_TABLET | Freq: Two times a day (BID) | SUBLINGUAL | Status: DC
  Administered 2018-03-14 (×2): 1 via SUBLINGUAL
  Filled 2018-03-14 (×2): qty 1

## 2018-03-14 NOTE — Progress Notes (Signed)
Patient has refused several of her medications this am including potassium and Lasix PO. MD has been notified.

## 2018-03-14 NOTE — Progress Notes (Addendum)
Notified dr Sharl Ma of box of "[employer] gloss laundry starch" found under pt bedside table with spoon in it. Pt admits to eating it and is upset that it was taken by nurse stating "That was my mom's. Are you going to give it back. I need that!" new orders for CBC with history of anemia and UDS. Will continue to monitor and educate pt.

## 2018-03-14 NOTE — Progress Notes (Signed)
PROGRESS NOTE   Amy Mcknight ZOX:[id number] DOB: [birth year] DOA: 02/08/2018 PCP: Patient, No Pcp Per  Brief History: 23 y.o.femalewith medical history significant forsubstance use disorderwho presented to the ED with 2 days of right lower back pain. Patient stated symptoms began with acute onset while at rest. She has noted associated diaphoresis, chills, shortness of breath, cough productive of brown sputum, and right chest wall pain. She denies any rashes or obvious skin changes. She reports good urine output without dysuria. She denies any abdominal pain, diarrhea, or constipation.  She admits to heroin use, last injection use reportedly 1 month ago. She continues to use recreational drugs by snorting nasally. She denies any alcohol use. She reports a history of withdrawal from opiates in the past.And is noted to be positive for marijuana and opiates on her UDS.  She is noted to be anemicand is status post 2 unit PRBC transfusion. No overt bleeding. She denies any nausea or vomiting.GI has evaluated patient with no need for endoscopy noted at this time.She intermittently refuses bathing and laboratory draws and has been refusing medications.   Lasix was started for fluid overload with +18kg since admission.  Pt has been refusing doses at times.  Repeat limited echo was obtained and showed some progression of her TR.  As a result cardiology was consulted to assist.  She is not a candidate for TV replacement/repair.     Assessment/Plan: Sepsis with early Septic shock -due toMSSA bacteremia, tricuspid endocarditis, paravertebral myositis and cavitary pneumonia -sepsis pathophysiology and septic shock has resolved -Case discussedpreviouslywith infectious disease doctor -continue IV Ancef for total of 6 weeks from date of first negative culture which was 02/12/2018 in a supervised setting ([facility]) -2D echo positive for tricuspid vegetation and moderate  regurgitation. 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 -remains afebrile and hemodynamically stable -continue Ancef.  Acute Diastolic CHF -pt +81 kg since admission -continueIV Lasix -follow daily weights and strict I's and O's -repeated CXR still demonstrating vascular congestion and pleural effusion. -03/05/18--repeat limited echo--more significant prolapse of TV with severe TR; LVEF 60-65% -daily weights--NEG 3.3 kg since starting lasix -Some improvement appreciated new patient fluid overload -There has been also increasing her urine output with over 2 L negative overnight.  Pt was treated with Lasix 60 mg every 12 hours but now has starting to refuse doses.  Continue heart healthy/low-sodium diet with fluid restriction.  Will place her on oral lasix 40 mg BID as per cardiology recommendations.     -appreciate cardiology consult. -educated about importance on watching sodium intake.  Filed Weights   03/12/18 0524 03/13/18 0545 03/14/18 0627  Weight: 69.6 kg 69.6 kg 70.9 kg    Hypomagnesemia -repleted And started on maintenance supplementation.   Hypokalemia -repleted and started on daily maintenance. -follow trend   AKI (acute kidney injury) (HCC) -resolved -serum creatinine peaked 2.61 -will follow BMET intermittently;we are addressing fluid overload with diuresis.  Substance use disorder/opiate withdrawal syndrome/anxiety-she appears to be resting comfortably for extended/long periods of time without any evidence of pain, patient has been weaned off Dilaudid, -okay to continuePRN low-dose oxycodone continuemethocarbamol  4 times daily, Cymbalta 30 mg twice daily and  gabapentin 300 mg 3 times daily -Discontinue hydroxyzine and continue using low-dose  Klonopin up to  3 times a day as needed for anxiety. -UDS on 02/24/2018 shows opiates --- this was done after there was suspicion of possible inhalation/snorting of drugs in the room on the telemetry sitter camera -Dilaudid has been discontinued patient only has PRN oxycodone 10 mg q8hr PRN at this time.  Considering suboxone.  -she is out of the window for active withdrawal symptoms and will plan on avoiding any extra opioids; especially no IV narcotics.  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  -patient continues to refuse to participate in ADLs often, wanting to have staff do more and more for her -complaints of back pain as of 02/25/2018, patient currently has MSSA bacteremia and back pain--- lumbar MRI from 02/26/2018 without any evidence of epidural abscess or significant inflammation , (prior lumbar MRI from 02/08/2018 was without contrast as patient refused contrast at that time ) -OOB with meals -PT intermittently.  Anemia of chronic disease -suspecthemolysis in the setting of acute infection-stable -Anemia panelrevealed some iron deficiency and stool occult is negative -2 unit PRBC orderedon 02/14/18--- Hgb remains stable post prior transfusion -Appreciate GI evaluation with no recommendations for endoscopy at this time -Continue p.o. PPI, -Ferahemegiven during this admission -3/1-given dose ferrelicit -continue following Hgb trend  Depression/Anxiety---- -stable -continue cymbalta -may use hydroxyzine for sleep and anxiety  Disposition Plan: Home on 03/26/18 if stable; continue IV  antibiotics. Family Communication:NoFamily at bedside  Consultants:ID  Code Status: FULL   DVT Prophylaxis: SCDs   Procedures: As Listed in Progress Note Above  Antibiotics: Cefazolin 2/7>> with anticipated last dose on 03/26/2018   Subjective: Pt refusing to take IV lasix.    Objective: Vitals:   03/13/18 1408 03/13/18 2121 03/14/18 0616 03/14/18 0627  BP: 110/88 120/88 113/85   Pulse: 100 (!) 102 96   Resp: 16 16 18    Temp: 97.7 F (36.5 C)  (!) 97.5 F (36.4 C)   TempSrc: Oral  Oral   SpO2: 100% 100% 100%   Weight:    70.9 kg  Height:        Intake/Output Summary (Last 24 hours) at 03/14/2018 0836 Last data filed at 03/13/2018 2200 Gross per 24 hour  Intake 600 ml  Output --  Net 600 ml   Weight change: 1.3 kg   Exam: General exam: Alert, awake, oriented x 3; no fever, no nausea, no vomiting, no abdominal pain. Respiratory system: BBS mostly clear,  no wheezing or crackles. Cardiovascular system: normal s1,s2 sounds, positive systolic ejection murmur, no rubs, no gallops. Gastrointestinal system: Abdomen is nondistended, soft and nontender. No organomegaly or masses felt. Normal bowel sounds heard. Central nervous system: Alert and oriented. No focal neurological deficits. Extremities: No cyanosis or clubbing.  Trace pretibial edema lower extremity bilaterally. Skin: No rashes, lesions or ulcers Psychiatry: Judgement and insight appear normal. Mood & affect appropriate.   Data Reviewed: I have personally reviewed following labs and imaging studies.  Basic Metabolic Panel: Recent Labs  Lab 03/07/18 1818 03/09/18 0637 03/11/18 1318 03/12/18 0447 03/13/18 0441 03/14/18 0540  NA 131* 135 134* 135 135 135  K 4.6 3.9 4.3 4.3 3.9 3.9  CL 96* 97* 95* 96* 98 99  CO2 27 31 31 31 30 29   GLUCOSE 134* 113* 105* 106* 118* 119*  BUN 8 6 6 6  5* 6  CREATININE 0.52 0.54 0.43* 0.41* 0.40* 0.40*  CALCIUM 8.1* 8.2* 7.9* 8.2* 8.3* 8.5*  MG 1.7  --   --    --   --   --    CBC: Recent Labs  Lab 03/07/18 1818 03/09/18 0637 03/14/18 0545  WBC 16.3* 14.4* 11.4*  NEUTROABS  --   --  6.0  HGB 8.4* 8.1* 8.7*  HCT 28.8* 27.9* 31.0*  MCV 91.4 92.1 94.8  PLT 374 347 337   Urine analysis:    Component Value Date/Time   COLORURINE YELLOW 02/24/2018 1845   APPEARANCEUR HAZY (A) 02/24/2018 1845   LABSPEC 1.020 02/24/2018 1845   PHURINE 5.0 02/24/2018 1845   GLUCOSEU NEGATIVE 02/24/2018 1845   HGBUR MODERATE (A) 02/24/2018 1845   BILIRUBINUR NEGATIVE 02/24/2018 1845   KETONESUR NEGATIVE 02/24/2018 1845   PROTEINUR 30 (A) 02/24/2018 1845   NITRITE NEGATIVE 02/24/2018 1845   LEUKOCYTESUR SMALL (A) 02/24/2018 1845    Scheduled Meds:  DULoxetine  30 mg Oral BID   feeding supplement  1 Container Oral BID BM   furosemide  60 mg Intravenous BID   gabapentin  300 mg Oral TID   magnesium oxide  400 mg Oral BID   methocarbamol  750 mg Oral QID   metoprolol tartrate  75 mg Oral BID   multivitamin with minerals  1 tablet Oral Daily   pantoprazole  40 mg Oral BID   potassium chloride  20 mEq Oral Daily   senna-docusate  2 tablet Oral BID   sodium chloride flush  10-40 mL Intracatheter Q12H   sodium chloride flush  3 mL Intravenous Q12H   Continuous Infusions:   ceFAZolin (ANCEF) IV 2 g (03/14/18 0540)    Procedures/Studies: Dg Chest 2 View  Result Date: 03/11/2018 CLINICAL DATA:  Bacteremia.  Drug abuse. EXAM: CHEST - 2 VIEW COMPARISON:  March 01, 2018 FINDINGS: The right PICC line terminates in the SVC, unchanged. Bilateral patchy pulmonary infiltrates persist with moderate bilateral pleural effusions, stable. The cardiomediastinal silhouette is unchanged. IMPRESSION: Persistent bilateral pleural effusions and patchy bilateral pulmonary infiltrates. Electronically Signed   By: Gerome Sam III M.D   On: 03/11/2018 15:54   Dg Chest 2 View  Result Date: 02/24/2018 CLINICAL DATA:  Short of breath EXAM: CHEST - 2 VIEW  COMPARISON:  02/20/2018 FINDINGS: Upper normal heart size. Stable left pleural effusion. Tiny right pleural effusion is stable. Patchy airspace opacities throughout both lungs are not significantly changed. No pneumothorax. IMPRESSION: Bilateral airspace disease and bilateral pleural effusions left greater than right are stable. Electronically Signed   By: Jolaine Click M.D.   On: 02/24/2018 15:50   Mr Lumbar Spine W Wo Contrast  Result Date: 02/26/2018 CLINICAL DATA:  Low back pain since prior abnormal MRI 02/08/2018. Unable to walk. EXAM: MRI LUMBAR SPINE WITHOUT AND WITH CONTRAST TECHNIQUE: Multiplanar and multiecho pulse sequences of the lumbar spine were obtained without and with intravenous contrast. CONTRAST:  7 mL Gadavist COMPARISON:  02/08/2018 FINDINGS: Segmentation:  Standard. Alignment:  Physiologic. Vertebrae:  No fracture, evidence of discitis, or bone lesion. Conus medullaris and cauda equina: Conus extends to the L1 level. Conus and cauda equina appear normal. Paraspinal and other soft tissues: Posterior paraspinal muscle edema bilaterally with mild enhancement on postcontrast imaging. No intramuscular fluid collection or hematoma. Disc levels: Disc spaces: Disc spaces are maintained. T12-L1: No significant disc bulge. No evidence of neural foraminal stenosis. No central canal stenosis. L1-L2:  No significant disc bulge. No evidence of neural foraminal stenosis. No central canal stenosis. L2-L3: No significant disc bulge. No evidence of neural foraminal stenosis. No central canal stenosis. L3-L4: No significant disc bulge. No evidence of neural foraminal stenosis. No central canal stenosis. L4-L5: Minimal broad-based disc bulge. No evidence of neural foraminal stenosis. No central canal stenosis. L5-S1: Minimal broad-based disc bulge. No evidence of neural foraminal stenosis. No central canal stenosis. IMPRESSION: 1. No epidural fluid collection.  No discitis or osteomyelitis. 2. Posterior  paraspinal muscle edema bilaterally with mild enhancement on postcontrast imaging. No intramuscular fluid collection or hematoma. Differential considerations include muscle strain versus mild myositis. Electronically Signed   By: Elige Ko   On: 02/26/2018 09:53   Dg Chest Port 1 View  Result Date: 03/01/2018 CLINICAL DATA:  Shortness of breath EXAM: PORTABLE CHEST 1 VIEW COMPARISON:  02/24/2018 FINDINGS: Cardiomegaly. Moderate to large bilateral pleural effusions, stable on the left, increasing on the right since prior study. Diffuse bilateral airspace disease has worsened since prior study and slightly more pronounced on the right. This could reflect asymmetric edema or pneumonia. IMPRESSION: Moderate to large bilateral pleural effusions, increasing on the right since prior study. Diffuse bilateral airspace disease, right greater than left, worsening since prior study. This could reflect asymmetric edema or pneumonia. Electronically Signed   By: Charlett Nose M.D.   On: 03/01/2018 18:14   Dg Chest Port 1 View  Result Date: 02/20/2018 CLINICAL DATA:  Sepsis, endocarditis, cavitary pneumonia with septic emboli and fever. EXAM: PORTABLE CHEST 1 VIEW COMPARISON:  02/08/2018 FINDINGS: Multiple cavitary lesions again identified in both lungs with associated airspace disease in a pattern likely representing septic emboli with multiple areas of cavitary infection. There is a new left lateral pleural effusion which appears likely loculated and may represent an empyema. Further evaluation with CT of the chest with contrast may be helpful. The heart size is stable. IMPRESSION: Persistent appearance of bilateral cavitary pneumonia likely representing septic emboli. There is a new loculated left lateral pleural effusion that may represent a developing empyema. Further evaluation with CT of the chest with contrast may be helpful. Electronically Signed   By: Irish Lack M.D.   On: 02/20/2018 19:11   [place] Ekg Site  Rite  Result Date: 02/26/2018 If Site Rite image not attached, placement could not be confirmed due to current cardiac rhythm.  Standley Dakins, MD [facility] How to contact the [facility] Attending or Consulting provider 7A - 7P or covering provider during after hours 7P -7A, for this patient?  1. Check the care team in [facility] and look for a) attending/consulting [facility] provider listed and b) the [facility] team listed 2. Log into [email or web address] and use 's universal password to access. If you do not have the password, please contact the hospital operator. 3. Locate the [facility] provider you are looking for under [facility] and page to a number that you can be directly reached. 4. If you still have difficulty reaching the provider, please page the [facility] (Director on Call) for the Hospitalists listed on amion for assistance.   03/14/2018, 8:36 AM   LOS: 34 days

## 2018-03-15 ENCOUNTER — Inpatient Hospital Stay (HOSPITAL_COMMUNITY): Payer: Self-pay

## 2018-03-15 ENCOUNTER — Other Ambulatory Visit: Payer: Self-pay

## 2018-03-15 LAB — CBC WITH DIFFERENTIAL/PLATELET
Abs Immature Granulocytes: 0.33 10*3/uL — ABNORMAL HIGH (ref 0.00–0.07)
Basophils Absolute: 0.1 10*3/uL (ref 0.0–0.1)
Basophils Relative: 0 %
EOS ABS: 0 10*3/uL (ref 0.0–0.5)
Eosinophils Relative: 0 %
HCT: 34.4 % — ABNORMAL LOW (ref 36.0–46.0)
Hemoglobin: 9.8 g/dL — ABNORMAL LOW (ref 12.0–15.0)
Immature Granulocytes: 1 %
Lymphocytes Relative: 11 %
Lymphs Abs: 2.7 10*3/uL (ref 0.7–4.0)
MCH: 27.2 pg (ref 26.0–34.0)
MCHC: 28.5 g/dL — ABNORMAL LOW (ref 30.0–36.0)
MCV: 95.6 fL (ref 80.0–100.0)
Monocytes Absolute: 1.4 10*3/uL — ABNORMAL HIGH (ref 0.1–1.0)
Monocytes Relative: 6 %
Neutro Abs: 20.8 10*3/uL — ABNORMAL HIGH (ref 1.7–7.7)
Neutrophils Relative %: 82 %
Platelets: 339 10*3/uL (ref 150–400)
RBC: 3.6 MIL/uL — ABNORMAL LOW (ref 3.87–5.11)
RDW: 19.8 % — ABNORMAL HIGH (ref 11.5–15.5)
WBC: 25.3 10*3/uL — AB (ref 4.0–10.5)
nRBC: 0.4 % — ABNORMAL HIGH (ref 0.0–0.2)

## 2018-03-15 LAB — BASIC METABOLIC PANEL
Anion gap: 18 — ABNORMAL HIGH (ref 5–15)
BUN: 14 mg/dL (ref 6–20)
CALCIUM: 8.9 mg/dL (ref 8.9–10.3)
CO2: 19 mmol/L — ABNORMAL LOW (ref 22–32)
Chloride: 96 mmol/L — ABNORMAL LOW (ref 98–111)
Creatinine, Ser: 0.87 mg/dL (ref 0.44–1.00)
GFR calc Af Amer: >60 mL/min (ref >60)
Glucose, Bld: 35 mg/dL — CL (ref 70–99)
Potassium: 5.4 mmol/L — ABNORMAL HIGH (ref 3.5–5.1)
Sodium: 133 mmol/L — ABNORMAL LOW (ref 135–145)

## 2018-03-15 LAB — GLUCOSE, CAPILLARY
GLUCOSE-CAPILLARY: 90 mg/dL (ref 70–99)
Glucose-Capillary: 157 mg/dL — ABNORMAL HIGH (ref 70–99)
Glucose-Capillary: 93 mg/dL (ref 70–99)
Glucose-Capillary: 109 mg/dL — ABNORMAL HIGH (ref 70–99)

## 2018-03-15 MED ORDER — POTASSIUM CHLORIDE CRYS ER 20 MEQ PO TBCR: 20.0000 meq | EXTENDED_RELEASE_TABLET | Freq: Every day | ORAL | Status: DC

## 2018-03-15 MED ORDER — SODIUM ZIRCONIUM CYCLOSILICATE 5 G PO PACK
5.0000 g | PACK | Freq: Once | ORAL | Status: DC
  Filled 2018-03-15: qty 1

## 2018-03-15 MED ORDER — BUPRENORPHINE HCL-NALOXONE HCL 2-0.5 MG SL SUBL
1.0000 | SUBLINGUAL_TABLET | Freq: Two times a day (BID) | SUBLINGUAL | Status: DC
  Administered 2018-03-15 – 2018-03-26 (×23): 1 via SUBLINGUAL
  Filled 2018-03-15 (×24): qty 1

## 2018-03-15 MED ORDER — DEXTROSE 50 % IV SOLN
25.0000 mL | Freq: Once | INTRAVENOUS | Status: AC
  Administered 2018-03-15: 25 mL via INTRAVENOUS

## 2018-03-15 MED ORDER — DEXTROSE 50 % IV SOLN
INTRAVENOUS | Status: AC
  Administered 2018-03-15: 25 mL via INTRAVENOUS
  Filled 2018-03-15: qty 50

## 2018-03-15 MED ORDER — SODIUM CHLORIDE 0.9 % IV SOLN
INTRAVENOUS | Status: DC | PRN
  Administered 2018-03-15 – 2018-03-19 (×2): via INTRAVENOUS

## 2018-03-15 NOTE — Progress Notes (Signed)
Lokelma ordered this am. Patient refused this morning, agreed to take other morning medications. Lokelma to be given 2 hours apart from other oral medications. Offered again this afternoon, patient refused stating "I need nausea medicine." discussed zofran not due again until 1730 as ordered PRN nausea but offered compazine as ordered for refractory nausea. Patient stated "i'll wait for the zofran." patient was asleep on nurse arrival to room, on awakening for IV antibiotic, pt stated pain 10/10 on 0-10 pain scale. Patient falling back asleep after IV antibiotic started. Text paged Dr. Laural Benes to notify. Earnstine Regal, RN

## 2018-03-15 NOTE — Progress Notes (Signed)
Patient agreed to have portable CXR today but when radiology came to complete CXR she refused test. Refused to have ultrasound of lower extremities as well. Stated "I'm not going down there so they can do it tomorrow." reminded patient that delaying tests today could delay treatments related to test results. Stated "I don't feel like all this" states she lays in bed at home as well and will not get up. When asked what caused her not to want to get out of bed, patient stated "quit asking me stupid questions." notified Dr. Laural Benes she has refused portable CXR and ultrasound. Earnstine Regal, RN

## 2018-03-15 NOTE — Progress Notes (Signed)
Nutrition Follow-up  DOCUMENTATION CODES:   Severe malnutrition in context of acute illness/injury  INTERVENTION:  CIB daily to provide 130 kcals and 5 grams protein per serving (serve with 1 cup whole milk for additional 108 kcals and 8.2 grams protein)  Magic cup TID with meals, each supplement provides 290 kcal and 9 grams of protein (vanilla)   NUTRITION DIAGNOSIS:   Severe Malnutrition related to acute illness(MSSA bacteremia, Pneumonia, endocarditis.) as evidenced by energy intake < or equal to 50% for > or equal to 5 days, edema.  Continues  GOAL:   Patient will meet greater than or equal to 90% of their needs  Progressing MONITOR:   PO intake, Weight trends, Labs, I & O's  REASON FOR ASSESSMENT:   Consult (Calorie Count)  ASSESSMENT:  Patient is a 23 yo female with a history of substance abuse, severe anemia (received 2 units PRBC's), MSSA bacteremia, Pneumonia, endocarditis. Bi-lateral pedal edema. Severe weight gain since admission-17% increase in wt or gain of 9.9 kg.    Supplements: none- patient refuses offer of any supplements or milk with meals- replies, "I don't have a taste for anything." Explained to patient that her body requires nutrition whether she "feels like" eating or not. RD unable to identify any food or beverage that pt would commit to eat or drink.  F/U- 2/24 patient consumed 100% of breakfast this morning and on RD arrival she is waiting on nutrition services to bring a snack of cereal. Patient says she is eating better and her demeanor is better today. Lower extremity edema looks better and she is wearing her ted hose. Labs reviewed. Current weight 68 kg which is a gain of 13.6 kg (20%).   F/U-2/28 patient ate 75% of breakfast this morning. Her weight continues to increase - an additional 5 kg since 2/24 and a total of 18 kg since admission. MD notified. No changes to nutrition plan at this point.  F/U- 3/6 patient consuming 25-50% meals  per recent chart documentation and pt report. Talked with nutrition services staff who affirm intake "comes and goes". Today patient is starting to eat breakfast cold cereal, eggs bacon and biscuit. Talked with her again about adding an oral nutrition supplement and she was accepting of the idea today. She prefers juice flavors and wants to trail [employer]. Will add to her nutrition regimen between meals. Previous severe wt gain noted- she has desirable loss from 73.4 down to 71.8 kg since last follow-up.  F/U- 3/12 patient reports 100% of breakfast and stated that she is always hungry in the morning. Large fruit tray at bedside still wrapped that was served for lunch. Patient reports that she loves fruit, but she is feeling nauseas and still full from breakfast. RD asked if she had tried any of the ONS provided and she stated that she did not like any of them. RD encouraged patient to take small bites of food, chew well, and remain sitting upright for 40-45 minutes after breakfast to assist with proper digestion, patient agreed to try. Patient agreed to having vanilla MC w/ meals and vanilla CIB. Weight Improvement noted today from recent downward trends since previous follow-up: 3/5 (71.8kg) 3/7 (71.4kg) 3/8 (70.2 kg) 3/9 (69.6kg) 3/10 (69.6kg), 3/11 (70.9kg)  NUTRITION - FOCUSED PHYSICAL EXAM:   Diet Order:   Diet Order            Diet Heart Room service appropriate? Yes; Fluid consistency: Thin; Fluid restriction: 2000 mL Fluid  Diet effective now  EDUCATION NEEDS:   Not appropriate for education at this time  Skin:  Skin Assessment: Reviewed RN Assessment  Last BM:  3/8  Height:   Ht Readings from Last 1 Encounters:  03/04/18 5\' 3"  (1.6 m)    Weight:   Wt Readings from Last 1 Encounters:  03/14/18 70.9 kg    Ideal Body Weight:  52 kg  BMI:  Body mass index is 27.69 kg/m.  Estimated Nutritional Needs:   Kcal:  1650-1815 (30-33 kcal/kg/bw)(Based on  admission wt of 55 kg. Severe wt gain (20 lb) since admission.)  Protein:  88-99 (1.6-1.8 gr/kg/bw)  Fluid:  per MD goals  Lars Masson, RD, LDN  After Hours/Weekend Pager: [phone]

## 2018-03-15 NOTE — Progress Notes (Signed)
PROGRESS NOTE   Rubye Bia IRJ:[id number] DOB: 04-Jan-1995 DOA: 02/08/2018 PCP: Patient, No Pcp Per  Brief History: 23 y.o.femalewith medical history significant forsubstance use disorderwho presented to the ED with 2 days of right lower back pain. Patient stated symptoms began with acute onset while at rest. She has noted associated diaphoresis, chills, shortness of breath, cough productive of brown sputum, and right chest wall pain. She denies any rashes or obvious skin changes. She reports good urine output without dysuria. She denies any abdominal pain, diarrhea, or constipation.  She admits to heroin use, last injection use reportedly 1 month ago. She continues to use recreational drugs by snorting nasally. She denies any alcohol use. She reports a history of withdrawal from opiates in the past.And is noted to be positive for marijuana and opiates on her UDS.  She is noted to be anemicand is status post 2 unit PRBC transfusion. No overt bleeding. She denies any nausea or vomiting.GI has evaluated patient with no need for endoscopy noted at this time.She intermittently refuses bathing and laboratory draws and has been refusing medications.   Lasix was started for fluid overload with +18kg since admission.  Pt has been refusing doses at times.  Repeat limited echo was obtained and showed some progression of her TR.  As a result cardiology was consulted to assist.  She is not a candidate for TV replacement/repair.     Assessment/Plan: Sepsis with early Septic shock -due toMSSA bacteremia, tricuspid endocarditis, paravertebral myositis and cavitary pneumonia -sepsis pathophysiology and septic shock has resolved -Case discussedpreviouslywith infectious disease doctor -continue IV Ancef for total of 6 weeks from date of first negative culture which was 02/12/2018 in a supervised setting ([facility]) -2D echo positive for tricuspid vegetation and moderate  regurgitation. 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 -remains afebrile and hemodynamically stable -continue Ancef.  Leukocytosis - Pt has been refusing treatments in hospital.  She refuses PA/Lat CXR today.  Ordered a portable CXR.  Follow up cbc in AM.    Acute Diastolic CHF -pt +30 kg since admission -continueIV Lasix -follow daily weights and strict I's and O's -repeated CXR still demonstrating vascular congestion and pleural effusion. -03/05/18--repeat limited echo--more significant prolapse of TV with severe TR; LVEF 60-65% -daily weights--NEG 3.3 kg since starting lasix -Some improvement appreciated new patient fluid overload -There has been also increasing her urine output with over 2 L negative overnight.  Pt was treated with Lasix 60 mg every 12 hours but now has starting to refuse doses.  Continue heart healthy/low-sodium diet with fluid restriction.  Will place her on oral lasix 40 mg BID as per cardiology recommendations.     -appreciate cardiology consult. -educated about importance on watching sodium intake.  Filed Weights   03/12/18 0524 03/13/18 0545 03/14/18 0627  Weight: 69.6 kg 69.6 kg 70.9 kg    Hypomagnesemia -repleted And started on maintenance supplementation.   Hypokalemia -repleted and started on daily maintenance. -follow trend   AKI (acute kidney injury) (HCC) -resolved -serum creatinine peaked 2.61 -will follow BMET intermittently;we are addressing fluid overload with diuresis.  Substance use disorder/opiate withdrawal syndrome/anxiety-she appears to be resting comfortably for extended/long periods of time without any evidence of pain, patient has been  weaned off  Dilaudid, -okay to continuePRN low-dose oxycodone continuemethocarbamol 750mg  4 times daily, Cymbalta 30 mg twice daily and gabapentin 300 mg 3 times daily -Discontinue hydroxyzine and continue using low-dose Klonopin up to  3 times a day as needed for anxiety. -UDS on 02/24/2018 shows opiates --- this was done after there was suspicion of possible inhalation/snorting of drugs in the room on the telemetry sitter camera -Dilaudid has been discontinued and patient started on oral suboxone.  - AVOID IV narcotics.  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  -patient continues to refuse to participate in ADLs often, wanting to have staff do more and more for her -complaints of back pain as of 02/25/2018, patient currently has MSSA bacteremia and back pain--- lumbar MRI from 02/26/2018 without any evidence of epidural abscess or significant inflammation , (prior lumbar MRI from 02/08/2018 was without contrast as patient refused contrast at that time ) -OOB with meals -PT intermittently.  PT IS REFUSING TO GET UP OUT OF BED  Anemia of chronic disease -suspecthemolysis in the setting of acute infection-stable -Anemia panelrevealed some iron deficiency and stool occult is negative -2 unit PRBC orderedon 02/14/18--- Hgb remains stable post prior transfusion -Appreciate GI evaluation with no recommendations for endoscopy at this time -Continue p.o. PPI, -Ferahemegiven during this admission -3/1-given dose ferrelicit -continue following Hgb trend  Bilateral LE Edema - Pt refusing to get up out of bed, not wearing SCDs.  Will request US venous BLE to check for DVT.     Depression/Anxiety -stable -continue cymbalta -may use hydroxyzine for sleep and anxiety  Disposition Plan: Home on 03/26/18 if stable; continue IV antibiotics. Family Communication:NoFamily at bedside  Consultants:ID  Code Status: FULL   DVT Prophylaxis: SCDs   Procedures: As Listed in Progress Note Above  Antibiotics: Cefazolin 2/7>> with anticipated last dose on 03/26/2018   Subjective: Pt refusing to take LASIX, refusing chest xray and refusing to get up out of bed.    Objective: Vitals:   03/14/18 2109 03/14/18 2144 03/15/18 0539 03/15/18 0626  BP:  94/73 (!) 130/101 (!) 121/98  Pulse:  (!) 107 (!) 101 95  Resp:  20 20   Temp:  98.3 F (36.8 C) (!) 97.5 F (36.4 C)   TempSrc:  Oral Oral   SpO2: 98% 94% 98%   Weight:      Height:        Intake/Output Summary (Last 24 hours) at 03/15/2018 1222 Last data filed at 03/15/2018 0950 Gross per 24 hour  Intake 250 ml  Output --  Net 250 ml   Weight change:    Exam: General exam: Alert, awake, oriented x 3; no fever, no nausea, no vomiting, no abdominal pain. Respiratory system: BBS mostly clear,  no wheezing or crackles. Cardiovascular system: normal s1,s2 sounds, positive systolic ejection murmur, no rubs, no gallops. Gastrointestinal system: Abdomen is nondistended, soft and nontender. No organomegaly or masses felt. Normal bowel sounds heard. Central nervous system: Alert and oriented. No focal neurological deficits. Extremities: No cyanosis or clubbing.  2+ edema lower extremity bilaterally. Skin: No rashes, lesions or ulcers Psychiatry: Judgement and insight appear normal. Mood & affect appropriate.   Data Reviewed: I have personally reviewed following labs and imaging studies.  Basic Metabolic Panel: Recent Labs  Lab 03/11/18 1318 03/12/18  0981 03/13/18 0441 03/14/18 0540 03/15/18 0513  NA 134* 135 135 135 133*  K 4.3 4.3 3.9 3.9 5.4*  CL 95* 96* 98 99 96*  CO2 19*  GLUCOSE 105* 106* 118* 119* 35*  BUN 6 6 5* 6 14  CREATININE 0.43* 0.41* 0.40* 0.40* 0.87  CALCIUM 7.9* 8.2* 8.3* 8.5* 8.9   CBC: Recent Labs  Lab 03/09/18 0637 03/14/18 0545 03/15/18 0513  WBC 14.4* 11.4* 25.3*  NEUTROABS  --  6.0 20.8*  HGB 8.1* 8.7* 9.8*  HCT 27.9* 31.0* 34.4*  MCV 92.1 94.8 95.6  PLT 347 337 339   Urine analysis:    Component Value Date/Time   COLORURINE YELLOW 02/24/2018 1845   APPEARANCEUR HAZY (A) 02/24/2018 1845   LABSPEC 1.020 02/24/2018 1845   PHURINE 5.0 02/24/2018 1845   GLUCOSEU NEGATIVE 02/24/2018 1845   HGBUR MODERATE (A) 02/24/2018 1845   BILIRUBINUR NEGATIVE 02/24/2018 1845   KETONESUR NEGATIVE 02/24/2018 1845   PROTEINUR 30 (A) 02/24/2018 1845   NITRITE NEGATIVE 02/24/2018 1845   LEUKOCYTESUR SMALL (A) 02/24/2018 1845    Scheduled Meds:  buprenorphine-naloxone  1 tablet Sublingual BID   DULoxetine  30 mg Oral BID   feeding supplement  1 Container Oral BID BM   ferrous sulfate  325 mg Oral Q breakfast   furosemide  40 mg Oral BID   gabapentin  300 mg Oral TID   magnesium oxide  400 mg Oral BID   methocarbamol  750 mg Oral QID   metoprolol tartrate  75 mg Oral BID   multivitamin with minerals  1 tablet Oral Daily   pantoprazole  40 mg Oral BID   senna-docusate  2 tablet Oral BID   sodium chloride flush  10-40 mL Intracatheter Q12H   sodium chloride flush  3 mL Intravenous Q12H   sodium zirconium cyclosilicate  5 g Oral Once   Continuous Infusions:   ceFAZolin (ANCEF) IV 2 g (03/15/18 0514)    Procedures/Studies: Dg Chest 2 View  Result Date: 03/11/2018 CLINICAL DATA:  Bacteremia.  Drug abuse. EXAM: CHEST - 2 VIEW COMPARISON:  March 01, 2018 FINDINGS: The right PICC line terminates in the SVC, unchanged. Bilateral patchy pulmonary infiltrates persist with moderate bilateral pleural effusions, stable. The cardiomediastinal silhouette is unchanged. IMPRESSION: Persistent bilateral pleural effusions and  patchy bilateral pulmonary infiltrates. Electronically Signed   By: Gerome Sam III M.D   On: 03/11/2018 15:54   Dg Chest 2 View  Result Date: 02/24/2018 CLINICAL DATA:  Short of breath EXAM: CHEST - 2 VIEW COMPARISON:  02/20/2018 FINDINGS: Upper normal heart size. Stable left pleural effusion. Tiny right pleural effusion is stable. Patchy airspace opacities throughout both lungs are not significantly changed. No pneumothorax. IMPRESSION: Bilateral airspace disease and bilateral pleural effusions left greater than right are stable. Electronically Signed   By: Jolaine Click M.D.   On: 02/24/2018 15:50   Mr Lumbar Spine W Wo Contrast  Result Date: 02/26/2018 CLINICAL DATA:  Low back pain since prior abnormal MRI 02/08/2018. Unable to walk. EXAM: MRI LUMBAR SPINE WITHOUT AND WITH CONTRAST TECHNIQUE: Multiplanar and multiecho pulse sequences of the lumbar spine were obtained without and with intravenous contrast. CONTRAST:  7 mL Gadavist COMPARISON:  02/08/2018 FINDINGS: Segmentation:  Standard. Alignment:  Physiologic. Vertebrae:  No fracture, evidence of discitis, or bone lesion. Conus medullaris and cauda equina: Conus extends to the L1 level. Conus and cauda equina appear normal. Paraspinal and other soft tissues: Posterior paraspinal  muscle edema bilaterally with mild enhancement on postcontrast imaging. No intramuscular fluid collection or hematoma. Disc levels: Disc spaces: Disc spaces are maintained. T12-L1: No significant disc bulge. No evidence of neural foraminal stenosis. No central canal stenosis. L1-L2: No significant disc bulge. No evidence of neural foraminal stenosis. No central canal stenosis. L2-L3: No significant disc bulge. No evidence of neural foraminal stenosis. No central canal stenosis. L3-L4: No significant disc bulge. No evidence of neural foraminal stenosis. No central canal stenosis. L4-L5: Minimal broad-based disc bulge. No evidence of neural foraminal stenosis. No central canal  stenosis. L5-S1: Minimal broad-based disc bulge. No evidence of neural foraminal stenosis. No central canal stenosis. IMPRESSION: 1. No epidural fluid collection.  No discitis or osteomyelitis. 2. Posterior paraspinal muscle edema bilaterally with mild enhancement on postcontrast imaging. No intramuscular fluid collection or hematoma. Differential considerations include muscle strain versus mild myositis. Electronically Signed   By: Elige Ko   On: 02/26/2018 09:53   Dg Chest Port 1 View  Result Date: 03/01/2018 CLINICAL DATA:  Shortness of breath EXAM: PORTABLE CHEST 1 VIEW COMPARISON:  02/24/2018 FINDINGS: Cardiomegaly. Moderate to large bilateral pleural effusions, stable on the left, increasing on the right since prior study. Diffuse bilateral airspace disease has worsened since prior study and slightly more pronounced on the right. This could reflect asymmetric edema or pneumonia. IMPRESSION: Moderate to large bilateral pleural effusions, increasing on the right since prior study. Diffuse bilateral airspace disease, right greater than left, worsening since prior study. This could reflect asymmetric edema or pneumonia. Electronically Signed   By: Charlett Nose M.D.   On: 03/01/2018 18:14   Dg Chest Port 1 View  Result Date: 02/20/2018 CLINICAL DATA:  Sepsis, endocarditis, cavitary pneumonia with septic emboli and fever. EXAM: PORTABLE CHEST 1 VIEW COMPARISON:  02/08/2018 FINDINGS: Multiple cavitary lesions again identified in both lungs with associated airspace disease in a pattern likely representing septic emboli with multiple areas of cavitary infection. There is a new left lateral pleural effusion which appears likely loculated and may represent an empyema. Further evaluation with CT of the chest with contrast may be helpful. The heart size is stable. IMPRESSION: Persistent appearance of bilateral cavitary pneumonia likely representing septic emboli. There is a new loculated left lateral pleural  effusion that may represent a developing empyema. Further evaluation with CT of the chest with contrast may be helpful. Electronically Signed   By: Irish Lack M.D.   On: 02/20/2018 19:11   [place] Ekg Site Rite  Result Date: 02/26/2018 If Site Rite image not attached, placement could not be confirmed due to current cardiac rhythm.  Standley Dakins, MD [facility] How to contact the [facility] Attending or Consulting provider 7A - 7P or covering provider during after hours 7P -7A, for this patient?  1. Check the care team in [facility] and look for a) attending/consulting [facility] provider listed and b) the [facility] team listed 2. Log into [email or web address] and use [place]'s universal password to access. If you do not have the password, please contact the hospital operator. 3. Locate the [facility] provider you are looking for under [facility] and page to a number that you can be directly reached. 4. If you still have difficulty reaching the provider, please page the [facility] (Director on Call) for the Hospitalists listed on amion for assistance.   03/15/2018, 12:22 PM   LOS: 35 days

## 2018-03-15 NOTE — Progress Notes (Signed)
Late entry for 0951 during system downtime. Patient had CXR ordered for this am. Refused to go with radiology tech for CXR. Nursing discussed with patient that CXR has been ordered by MD. Stated "I'm sleeping, not going and you can leave." text paged Dr. Laural Benes to notify. Also notified him of tele-sitter order needing to be renewed. Earnstine Regal, RN

## 2018-03-15 NOTE — Clinical Social Work Note (Signed)
Consult placed safe needle exchange by attending. Patient provide information on [facility] [address] [place], [phone].  Patient took the information but was not receptive.     Najib Colmenares, Juleen China, LCSW

## 2018-03-15 NOTE — Progress Notes (Addendum)
Hypoglycemic Event  CBG: 35  Treatment: 70ml D50  Symptoms: sweating  Follow-up CBG: Time:0710 CBG Result:157  Possible Reasons for Event:unknown, pt ate through night  Dr Laural Benes notified [address]

## 2018-03-15 NOTE — Progress Notes (Signed)
Spoke with pt about the importance of taking lasix as scheduled. At this time pt is understanding.

## 2018-03-15 NOTE — Progress Notes (Signed)
Patient states she will agree to have CXR completed as portable but does not want to leave the room. Text paged Dr. Laural Benes to notify. Also requested "I need something for nausea" did not want morning medications at this time. Earnstine Regal, RN

## 2018-03-15 NOTE — Progress Notes (Signed)
Notified R david via amion page system regarding increase in WBC from yesterday's values. No new orders at this time.

## 2018-03-15 NOTE — Progress Notes (Signed)
Patient continues to develop an attitude with RT staff. Upon arrival patient found with O2 sat of 75% on room air. RT tried to help patient get O2 back in nose and patient cussed at RT and got an attitude about having to put oxygen back in nose. Explained to patient the risks of having a low O2 saturation and that we were only trying to help her.

## 2018-03-15 NOTE — Progress Notes (Signed)
Late entry. Patient assisted up to bedside commode this afternoon by nurse tech. Patient completed bath while sitting up and was assisted back to bed by nurse tech. Reinforced fall prevention/safety plan with patient as needed. Pt demonstrates correct use of call light and calling for assistance. Patient refused to ambulate in room or hallway. MD aware. Patient refused Lasix tonight, stated "i'll call when I'm ready for it." night shift RN aware. Earnstine Regal, RN

## 2018-03-16 ENCOUNTER — Inpatient Hospital Stay (HOSPITAL_COMMUNITY): Payer: Self-pay

## 2018-03-16 ENCOUNTER — Other Ambulatory Visit: Payer: Self-pay

## 2018-03-16 LAB — CBC WITH DIFFERENTIAL/PLATELET
Abs Immature Granulocytes: 0.1 10*3/uL — ABNORMAL HIGH (ref 0.00–0.07)
BASOS ABS: 0.1 10*3/uL (ref 0.0–0.1)
Basophils Relative: 0 %
EOS ABS: 0.1 10*3/uL (ref 0.0–0.5)
Eosinophils Relative: 0 %
HCT: 33.9 % — ABNORMAL LOW (ref 36.0–46.0)
Hemoglobin: 9.7 g/dL — ABNORMAL LOW (ref 12.0–15.0)
Immature Granulocytes: 1 %
LYMPHS ABS: 5.3 10*3/uL — AB (ref 0.7–4.0)
Lymphocytes Relative: 31 %
MCH: 27.1 pg (ref 26.0–34.0)
MCHC: 28.6 g/dL — ABNORMAL LOW (ref 30.0–36.0)
MCV: 94.7 fL (ref 80.0–100.0)
Monocytes Absolute: 1.2 10*3/uL — ABNORMAL HIGH (ref 0.1–1.0)
Monocytes Relative: 7 %
NRBC: 1.5 % — AB (ref 0.0–0.2)
Neutro Abs: 10.2 10*3/uL — ABNORMAL HIGH (ref 1.7–7.7)
Neutrophils Relative %: 61 %
Platelets: 336 10*3/uL (ref 150–400)
RBC: 3.58 MIL/uL — ABNORMAL LOW (ref 3.87–5.11)
RDW: 20.1 % — ABNORMAL HIGH (ref 11.5–15.5)
WBC: 16.9 10*3/uL — ABNORMAL HIGH (ref 4.0–10.5)

## 2018-03-16 LAB — COMPREHENSIVE METABOLIC PANEL
ALT: 18 U/L (ref 0–44)
AST: 90 U/L — ABNORMAL HIGH (ref 15–41)
Albumin: 2.1 g/dL — ABNORMAL LOW (ref 3.5–5.0)
Alkaline Phosphatase: 157 U/L — ABNORMAL HIGH (ref 38–126)
Anion gap: 12 (ref 5–15)
BUN: 25 mg/dL — ABNORMAL HIGH (ref 6–20)
CO2: 26 mmol/L (ref 22–32)
Calcium: 8.6 mg/dL — ABNORMAL LOW (ref 8.9–10.3)
Chloride: 96 mmol/L — ABNORMAL LOW (ref 98–111)
Creatinine, Ser: 0.87 mg/dL (ref 0.44–1.00)
GFR calc Af Amer: >60 mL/min (ref >60)
GFR calc non Af Amer: >60 mL/min (ref >60)
Glucose, Bld: 79 mg/dL (ref 70–99)
POTASSIUM: 5.2 mmol/L — AB (ref 3.5–5.1)
Sodium: 134 mmol/L — ABNORMAL LOW (ref 135–145)
TOTAL PROTEIN: 7.4 g/dL (ref 6.5–8.1)
Total Bilirubin: 0.5 mg/dL (ref 0.3–1.2)

## 2018-03-16 LAB — GLUCOSE, CAPILLARY
GLUCOSE-CAPILLARY: 137 mg/dL — AB (ref 70–99)
Glucose-Capillary: 74 mg/dL (ref 70–99)
Glucose-Capillary: 96 mg/dL (ref 70–99)
Glucose-Capillary: 122 mg/dL — ABNORMAL HIGH (ref 70–99)
Glucose-Capillary: 136 mg/dL — ABNORMAL HIGH (ref 70–99)

## 2018-03-16 MED ORDER — SODIUM ZIRCONIUM CYCLOSILICATE 10 G PO PACK
10.0000 g | PACK | Freq: Three times a day (TID) | ORAL | Status: AC
  Administered 2018-03-16 (×2): 10 g via ORAL
  Filled 2018-03-16 (×3): qty 1

## 2018-03-16 NOTE — Progress Notes (Signed)
PROGRESS NOTE   Amy Mcknight GYK:[id number] DOB: May 03, 1995 DOA: 02/08/2018 PCP: Patient, No Pcp Per  Brief History: 23 y.o.femalewith medical history significant forsubstance use disorderwho presented to the ED with 2 days of right lower back pain. Patient stated symptoms began with acute onset while at rest. She has noted associated diaphoresis, chills, shortness of breath, cough productive of brown sputum, and right chest wall pain. She denies any rashes or obvious skin changes. She reports good urine output without dysuria. She denies any abdominal pain, diarrhea, or constipation.  She admits to heroin use, last injection use reportedly 1 month ago. She continues to use recreational drugs by snorting nasally. She denies any alcohol use. She reports a history of withdrawal from opiates in the past.And is noted to be positive for marijuana and opiates on her UDS.  She is noted to be anemicand is status post 2 unit PRBC transfusion. No overt bleeding. She denies any nausea or vomiting.GI has evaluated patient with no need for endoscopy noted at this time.She intermittently refuses bathing and laboratory draws and has been refusing medications.  Risks of refusing treatments explained to patient who verbalized understanding.   Lasix was started for fluid overload with +18kg since admission.  Pt has been refusing lasix doses at times.  Repeat limited echo was obtained and showed some progression of her TR.  As a result cardiology was consulted to assist.  She is not a candidate for TV replacement/repair.     Assessment/Plan: Sepsis with early Septic shock -due toMSSA bacteremia, tricuspid endocarditis, paravertebral myositis and cavitary pneumonia -sepsis pathophysiology and septic shock has resolved -Case discussedpreviouslywith infectious disease doctor -continue IV Ancef for total of 6 weeks from date of first negative culture which was 02/12/2018 in a  supervised setting (IVDU) -2D echo positive for tricuspid vegetation and moderate regurgitation. 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 -remains afebrile and hemodynamically stable -continue Ancef.  Leukocytosis - Pt has been refusing treatments in hospital.  She refuses PA/Lat CXR today.  Pt also refused a portable CXR.  WBC trending down today.     Acute Diastolic CHF -pt +79 kg since admission -continueIV Lasix -follow daily weights and strict I's and O's -repeated CXR still demonstrating vascular congestion and pleural effusion. -03/05/18--repeat limited echo--more significant prolapse of TV with severe TR; LVEF 60-65% -daily weights ---> Weight trending higher as she is refusing to take lasix.  -Some improvement appreciated new patient fluid overload -There has been also increasing her urine output with over 2 L negative overnight.  Pt was initially treated with Lasix 60 mg every 12 hours but now has starting to refuse doses.  Continue heart healthy/low-sodium diet with fluid restriction.  Will place her on oral lasix 40 mg BID as per cardiology recommendations but she does not take as prescribed, refusing doses.      -appreciate cardiology consult. -educated about importance on watching sodium intake.  Filed Weights   03/13/18 0545 03/14/18 0627 03/16/18 0601  Weight: 69.6 kg 70.9 kg 71.7 kg    Hypomagnesemia -repleted And started on maintenance supplementation.   Hyperkalemia - Pt refused Lokelma and lasix yesterday.  I explained risks of not treating with patient and she verbalized understanding.   AKI (acute kidney  injury) -resolved -serum creatinine peaked 2.61  -will follow  BMET intermittently;we are addressing fluid overload with diuresis.  Substance use disorder/opiate withdrawal syndrome/anxiety-she appears to be resting comfortably for extended/long periods of time without any evidence of pain, patient has been weaned off Dilaudid, -okay to continuePRN low-dose oxycodone continuemethocarbamol 750mg  4 times daily, Cymbalta 30 mg twice daily and gabapentin 300 mg 3 times daily -Discontinue hydroxyzine and continue using low-dose Klonopin up to  3 times a day as needed for anxiety. -UDS on 02/24/2018 shows opiates --- this was done after there was suspicion of possible inhalation/snorting of drugs in the room on the telemetry sitter camera -Dilaudid and oxycodone has been discontinued and patient started on oral suboxone 03/14/18.  - AVOIDING IV narcotics. - Pt requested if she could snort suboxone and she was told that this would NOT be allowed.   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  -patient continues to refuse to participate in ADLs often, wanting to have staff do more and more for her -complaints of back pain as of 02/25/2018, patient currently has MSSA bacteremia and back pain--- lumbar MRI from 02/26/2018 without any evidence of epidural abscess or significant inflammation , (prior lumbar MRI from 02/08/2018 was without contrast as patient refused contrast at that time ) -OOB with meals -PT intermittently.  PT IS REFUSING TO GET UP OUT OF BED  Anemia of chronic disease -suspecthemolysis in the setting of acute infection-stable -Anemia panelrevealed some iron deficiency and stool occult is negative -2 unit PRBC orderedon 02/14/18--- Hgb remains stable post prior  transfusion -Appreciate GI evaluation with no recommendations for endoscopy at this time -Continue p.o. PPI, -Ferahemegiven during this admission -3/1-given dose ferrelicit -continue following Hgb trend  Bilateral LE Edema - Pt refusing to get up out of bed, not wearing SCDs.  I have been concerned about DVT.   Pt has refused US venous BLE to check for DVT.    Depression/Anxiety -stable -continue cymbalta -may use hydroxyzine for sleep and anxiety   Disposition Plan: Home on 03/26/18 if stable; continue IV antibiotics. Family Communication:NoFamily at bedside  Consultants:ID  Code Status: FULL   DVT Prophylaxis: SCDs   Procedures: As Listed in Progress Note Above  Antibiotics: Cefazolin 2/7>> with anticipated last dose on 03/26/2018   Subjective: Pt refusing to take LASIX, refused chest xray and refusing to get up out of bed and refused [place] of legs to rule out DVT.  Pt says she is eating breakfast and refused to answer any more questions.  No specific complaints.     Objective: Vitals:   03/15/18 2110 03/16/18 0601 03/16/18 0723 03/16/18 0725  BP: 96/67 90/60    Pulse: 92 99 87   Resp: (!) 24 20    Temp: (!) 97.4 F (36.3 C) 97.7 F (36.5 C)    TempSrc: Oral Oral    SpO2: 100% (!) 80% 90% 98%  Weight:  71.7 kg    Height:        Intake/Output Summary (Last 24 hours) at 03/16/2018 1202 Last data filed at 03/16/2018 0631 Gross per 24 hour  Intake 1472.82 ml  Output 550 ml  Net 922.82 ml   Weight change:    Exam: General exam: Alert, awake, oriented x 3; no fever, no nausea, no vomiting,  no abdominal pain.  Pt uncooperative.  Respiratory system: BBS mostly clear,  no wheezing or crackles. Cardiovascular system: normal s1,s2 sounds,  systolic ejection murmur, no rubs, no gallops. Gastrointestinal system: Abdomen is nondistended, soft and nontender. No organomegaly or masses felt. Normal bowel sounds heard. Central nervous system: Alert and  oriented. No focal neurological deficits. Extremities: No cyanosis or clubbing.  2+ edema lower extremity bilaterally. Skin: No rashes, lesions or ulcers Psychiatry: Judgement and insight poor.   Data Reviewed: I have personally reviewed following labs and imaging studies.  Basic Metabolic Panel: Recent Labs  Lab 03/12/18 0447 03/13/18 0441 03/14/18 0540 03/15/18 0513 03/16/18 0452  NA 135 135 135 133* 134*  K 4.3 3.9 3.9 5.4* 5.2*  CL 96* 98 99 96* 96*  CO2 19* 26  GLUCOSE 106* 118* 119* 35* 79  BUN 6 5* 6 14 25*  CREATININE 0.41* 0.40* 0.40* 0.87 0.87  CALCIUM 8.2* 8.3* 8.5* 8.9 8.6*   CBC: Recent Labs  Lab 03/14/18 0545 03/15/18 0513 03/16/18 0452  WBC 11.4* 25.3* 16.9*  NEUTROABS 6.0 20.8* 10.2*  HGB 8.7* 9.8* 9.7*  HCT 31.0* 34.4* 33.9*  MCV 94.8 95.6 94.7  PLT 337 339 336   Urine analysis:    Component Value Date/Time   COLORURINE YELLOW 02/24/2018 1845   APPEARANCEUR HAZY (A) 02/24/2018 1845   LABSPEC 1.020 02/24/2018 1845   PHURINE 5.0 02/24/2018 1845   GLUCOSEU NEGATIVE 02/24/2018 1845   HGBUR MODERATE (A) 02/24/2018 1845   BILIRUBINUR NEGATIVE 02/24/2018 1845   KETONESUR NEGATIVE 02/24/2018 1845   PROTEINUR 30 (A) 02/24/2018 1845   NITRITE NEGATIVE 02/24/2018 1845   LEUKOCYTESUR SMALL (A) 02/24/2018 1845    Scheduled Meds:  buprenorphine-naloxone  1 tablet Sublingual BID   DULoxetine  30 mg Oral BID   feeding supplement  1 Container Oral BID BM   ferrous sulfate  325 mg Oral Q breakfast   furosemide  40 mg Oral BID   gabapentin  300 mg Oral TID   magnesium oxide  400 mg Oral BID   methocarbamol  750 mg Oral QID   metoprolol tartrate  75 mg Oral BID   multivitamin with minerals  1 tablet Oral Daily   pantoprazole  40 mg Oral BID   senna-docusate  2 tablet Oral BID   sodium chloride flush  10-40 mL Intracatheter Q12H   sodium chloride flush  3 mL Intravenous Q12H   sodium zirconium cyclosilicate  10 g Oral TID    Continuous Infusions:  sodium chloride 10 mL/hr at 03/15/18 1449    ceFAZolin (ANCEF) IV 2 g (03/16/18 0631)    Procedures/Studies: Dg Chest 2 View  Result Date: 03/11/2018 CLINICAL DATA:  Bacteremia.  Drug abuse. EXAM: CHEST - 2 VIEW COMPARISON:  March 01, 2018 FINDINGS: The right PICC line terminates in the SVC, unchanged. Bilateral patchy pulmonary infiltrates persist with moderate bilateral pleural effusions, stable. The cardiomediastinal silhouette is unchanged. IMPRESSION: Persistent bilateral pleural effusions and patchy bilateral pulmonary infiltrates. Electronically Signed   By: Gerome Sam III M.D   On: 03/11/2018 15:54   Dg Chest 2 View  Result Date: 02/24/2018 CLINICAL DATA:  Short of breath EXAM: CHEST - 2 VIEW COMPARISON:  02/20/2018 FINDINGS: Upper normal heart size. Stable left pleural effusion. Tiny right pleural effusion is stable. Patchy airspace opacities throughout both lungs are not significantly changed. No pneumothorax. IMPRESSION: Bilateral airspace disease and bilateral pleural effusions left greater than right are stable. Electronically Signed  By: Jolaine Click M.D.   On: 02/24/2018 15:50   Mr Lumbar Spine W Wo Contrast  Result Date: 02/26/2018 CLINICAL DATA:  Low back pain since prior abnormal MRI 02/08/2018. Unable to walk. EXAM: MRI LUMBAR SPINE WITHOUT AND WITH CONTRAST TECHNIQUE: Multiplanar and multiecho pulse sequences of the lumbar spine were obtained without and with intravenous contrast. CONTRAST:  7 mL Gadavist COMPARISON:  02/08/2018 FINDINGS: Segmentation:  Standard. Alignment:  Physiologic. Vertebrae:  No fracture, evidence of discitis, or bone lesion. Conus medullaris and cauda equina: Conus extends to the L1 level. Conus and cauda equina appear normal. Paraspinal and other soft tissues: Posterior paraspinal muscle edema bilaterally with mild enhancement on postcontrast imaging. No intramuscular fluid collection or hematoma. Disc levels: Disc  spaces: Disc spaces are maintained. T12-L1: No significant disc bulge. No evidence of neural foraminal stenosis. No central canal stenosis. L1-L2: No significant disc bulge. No evidence of neural foraminal stenosis. No central canal stenosis. L2-L3: No significant disc bulge. No evidence of neural foraminal stenosis. No central canal stenosis. L3-L4: No significant disc bulge. No evidence of neural foraminal stenosis. No central canal stenosis. L4-L5: Minimal broad-based disc bulge. No evidence of neural foraminal stenosis. No central canal stenosis. L5-S1: Minimal broad-based disc bulge. No evidence of neural foraminal stenosis. No central canal stenosis. IMPRESSION: 1. No epidural fluid collection.  No discitis or osteomyelitis. 2. Posterior paraspinal muscle edema bilaterally with mild enhancement on postcontrast imaging. No intramuscular fluid collection or hematoma. Differential considerations include muscle strain versus mild myositis. Electronically Signed   By: Elige Ko   On: 02/26/2018 09:53   Dg Chest Port 1 View  Result Date: 03/01/2018 CLINICAL DATA:  Shortness of breath EXAM: PORTABLE CHEST 1 VIEW COMPARISON:  02/24/2018 FINDINGS: Cardiomegaly. Moderate to large bilateral pleural effusions, stable on the left, increasing on the right since prior study. Diffuse bilateral airspace disease has worsened since prior study and slightly more pronounced on the right. This could reflect asymmetric edema or pneumonia. IMPRESSION: Moderate to large bilateral pleural effusions, increasing on the right since prior study. Diffuse bilateral airspace disease, right greater than left, worsening since prior study. This could reflect asymmetric edema or pneumonia. Electronically Signed   By: Charlett Nose M.D.   On: 03/01/2018 18:14   Dg Chest Port 1 View  Result Date: 02/20/2018 CLINICAL DATA:  Sepsis, endocarditis, cavitary pneumonia with septic emboli and fever. EXAM: PORTABLE CHEST 1 VIEW COMPARISON:   02/08/2018 FINDINGS: Multiple cavitary lesions again identified in both lungs with associated airspace disease in a pattern likely representing septic emboli with multiple areas of cavitary infection. There is a new left lateral pleural effusion which appears likely loculated and may represent an empyema. Further evaluation with CT of the chest with contrast may be helpful. The heart size is stable. IMPRESSION: Persistent appearance of bilateral cavitary pneumonia likely representing septic emboli. There is a new loculated left lateral pleural effusion that may represent a developing empyema. Further evaluation with CT of the chest with contrast may be helpful. Electronically Signed   By: Irish Lack M.D.   On: 02/20/2018 19:11   [place] Ekg Site Rite  Result Date: 02/26/2018 If Site Rite image not attached, placement could not be confirmed due to current cardiac rhythm.  Standley Dakins, MD [facility] How to contact the [facility] Attending or Consulting provider 7A - 7P or covering provider during after hours 7P -7A, for this patient?  1. Check the care team in [facility] and look for a) attending/consulting  TR[facility] provider listed and b) the [facility],The team listed 2. Log into [email or web address] and use [facility] universal password to access. If you do not have the password, please contact the hospital operator. 3. Locate the [facility] provider you are looking for under [facility] and page to a number that you can be directly reached. 4. If you still have difficulty reaching the provider, please page the [facility] (Director on Call) for the Hospitalists listed on amion for assistance.   03/16/2018, 12:02 PM   LOS: 36 days

## 2018-03-16 NOTE — Progress Notes (Signed)
RN paged Dr. Onalee Hua requesting 1:1 safety tele-monitor be renewed, Dr. Onalee Hua returned call and order received.  P.J. Henderson Newcomer, RN

## 2018-03-17 DIAGNOSIS — E876 Hypokalemia: Secondary | ICD-10-CM

## 2018-03-17 LAB — RENAL FUNCTION PANEL
ALBUMIN: 2 g/dL — AB (ref 3.5–5.0)
Anion gap: 10 (ref 5–15)
BUN: 23 mg/dL — AB (ref 6–20)
CO2: 29 mmol/L (ref 22–32)
CREATININE: 0.6 mg/dL (ref 0.44–1.00)
Calcium: 8.1 mg/dL — ABNORMAL LOW (ref 8.9–10.3)
Chloride: 96 mmol/L — ABNORMAL LOW (ref 98–111)
GFR calc Af Amer: >60 mL/min (ref >60)
GFR calc non Af Amer: >60 mL/min (ref >60)
Glucose, Bld: 115 mg/dL — ABNORMAL HIGH (ref 70–99)
Phosphorus: 3.5 mg/dL (ref 2.5–4.6)
Potassium: 3.3 mmol/L — ABNORMAL LOW (ref 3.5–5.1)
Sodium: 135 mmol/L (ref 135–145)

## 2018-03-17 LAB — CBC WITH DIFFERENTIAL/PLATELET
Band Neutrophils: 0 %
Basophils Absolute: 0.1 10*3/uL (ref 0.0–0.1)
Basophils Relative: 1 %
Blasts: 0 %
Eosinophils Absolute: 0.1 10*3/uL (ref 0.0–0.5)
Eosinophils Relative: 1 %
HCT: 31.6 % — ABNORMAL LOW (ref 36.0–46.0)
HEMOGLOBIN: 9.1 g/dL — AB (ref 12.0–15.0)
Lymphocytes Relative: 33 %
Lymphs Abs: 4 10*3/uL (ref 0.7–4.0)
MCH: 26.9 pg (ref 26.0–34.0)
MCHC: 28.8 g/dL — ABNORMAL LOW (ref 30.0–36.0)
MCV: 93.5 fL (ref 80.0–100.0)
METAMYELOCYTES PCT: 1 %
MONO ABS: 0.8 10*3/uL (ref 0.1–1.0)
MYELOCYTES: 0 %
Monocytes Relative: 7 %
Neutro Abs: 7 10*3/uL (ref 1.7–7.7)
Neutrophils Relative %: 57 %
Other: 0 %
Platelets: 278 10*3/uL (ref 150–400)
Promyelocytes Relative: 0 %
RBC: 3.38 MIL/uL — ABNORMAL LOW (ref 3.87–5.11)
RDW: 20.5 % — ABNORMAL HIGH (ref 11.5–15.5)
WBC: 12 10*3/uL — ABNORMAL HIGH (ref 4.0–10.5)
nRBC: 0 /100 WBC
nRBC: 1.7 % — ABNORMAL HIGH (ref 0.0–0.2)

## 2018-03-17 LAB — GLUCOSE, CAPILLARY
Glucose-Capillary: 114 mg/dL — ABNORMAL HIGH (ref 70–99)
Glucose-Capillary: 95 mg/dL (ref 70–99)
Glucose-Capillary: 101 mg/dL — ABNORMAL HIGH (ref 70–99)

## 2018-03-17 LAB — MAGNESIUM: MAGNESIUM: 1.4 mg/dL — AB (ref 1.7–2.4)

## 2018-03-17 MED ORDER — POTASSIUM CHLORIDE CRYS ER 20 MEQ PO TBCR
40.0000 meq | EXTENDED_RELEASE_TABLET | Freq: Once | ORAL | Status: DC
  Filled 2018-03-17: qty 2

## 2018-03-17 NOTE — Progress Notes (Signed)
Patient also refused to have weight done. Patient being rude to CNA.

## 2018-03-17 NOTE — Progress Notes (Addendum)
PROGRESS NOTE                                                                                                                                                                                                             Patient Demographics:    Amy Mcknight, is a 23 y.o. female, DOB - 18-Aug-1995, ZOX:[id number]  Admit date - 02/08/2018   Admitting Physician Charlsie Quest, MD  Outpatient Primary MD for the patient is Patient, No Pcp Per  LOS - 37  Outpatient Specialists: None  No chief complaint on file.      Brief Narrative   23 year old female with history of substance use disorder presented to the ED with 2-day history of right lower back pain of acute onset with associated diaphoresis, chills, shortness of breath, productive cough and right chest wall pain.  She has active heroin use last use 1 month prior to admission, regular recreational drug use (snorting nasally), reports history of opiate withdrawal.  Urine drug screen was positive for marijuana and opiates. 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  Requiring hospital stay for 6 weeks of antibiotic therapy. Hospital course complicated with development of acute diastolic CHF and treatment noncompliance.      Subjective:   Patient denies any shortness of breath.  Reportedly he has been refusing blood work, weight monitoring and being rude to the nursing staff   Assessment  & Plan :    Principal Problem:   MSSA bacteremia Initial presentation was sepsis with septic shock.  Has tricuspid endocarditis, paravertebral myositis and cavitary pneumonia. Sepsis has resolved.  Case was discussed with ID who recommended inpatient IV antibiotic with Ancef for total 6 weeks duration until 3/23. Repeat blood cultures have been negative.  Active Problems: Acute diastolic  CHF. Gained 14 kg since admission.  Being diuresed with IV Lasix.  Having difficulty starting a/O and daily weight as patient refusing vitals done.  Echo shows severe TR with LVEF of 60-65%. Counseled on importance of daily weight and monitoring I/O. Continue p.o. Lasix 40 mg twice daily (refusing IV). Cardiology consult appreciated.   Severe sepsis and cavitary pneumonia. Metastatic from MSSA bacteremia/endocarditis.  Hemodynamically stable.  Continue Ancef.   Leukocytosis Improving a.m. lab.  Last chest x-ray on 3/8 with persistent effusion.  Remains afebrile.  Hypokalemia Replenished.  Acute kidney injury Secondary to sepsis.  Serum creatinine peaked at 2.61.  Now resolved.  Positive hepatitis C antibody .  Quantitative viral load undetectable.  Polysubstance abuse with?  Opiate withdrawal. Avoiding all narcotics.  Was started on Suboxone on 3/11.  Generalized weakness and physical debility  Intermittent PT.  Patient refusing getting out of bed.  Anemia of chronic disease Stable.  Received 2 unit PRBC on 2/12.  No intervention per GI.  Continue PPI.  Received Feraheme this admission.  Bilateral lower extremity edema Possibly due to volume overload.  Patient refusing to get out of bed and not wearing SCDs.  Refused ultrasound to check for DVT.  Anxiety and depression Continue Cymbalta.     Code Status : Full code  Family Communication  : None at bedside  Disposition Plan  : Home after completion of IV antibiotic on 3/23  Barriers For Discharge : Inpatient IV antibiotic  Consults  : GI, cardiology  Procedures  : 2D echo, MRI lumbar spine,  DVT Prophylaxis  : SCDs  Lab Results  Component Value Date   PLT 278 03/17/2018    Antibiotics  :   Anti-infectives (From admission, onward)   Start     Dose/Rate Route Frequency Ordered Stop   02/09/18 1500  vancomycin (VANCOCIN) IVPB 750 mg/150 ml premix  Status:  Discontinued     750 mg 150 mL/hr over 60 Minutes  Intravenous Every 24 hours 02/08/18 1553 02/09/18 1201   02/09/18 1400  ceFAZolin (ANCEF) IVPB 2g/100 mL premix     2 g 200 mL/hr over 30 Minutes Intravenous Every 8 hours 02/09/18 1220 03/26/18 2359   02/09/18 1300  ceFAZolin (ANCEF) 2 g in dextrose 5 % 100 mL IVPB  Status:  Discontinued     2 g 200 mL/hr over 30 Minutes Intravenous Every 8 hours 02/09/18 1201 02/09/18 1220   02/09/18 0000  meropenem (MERREM) 1 g in sodium chloride 0.9 % 100 mL IVPB  Status:  Discontinued     1 g 200 mL/hr over 30 Minutes Intravenous Every 12 hours 02/08/18 1459 02/09/18 0853   02/08/18 1445  vancomycin (VANCOCIN) IVPB 1000 mg/200 mL premix     1,000 mg 200 mL/hr over 60 Minutes Intravenous  Once 02/08/18 1442 02/08/18 1555   02/08/18 1415  meropenem (MERREM) 1 g in sodium chloride 0.9 % 100 mL IVPB     1 g 200 mL/hr over 30 Minutes Intravenous  Once 02/08/18 1402 02/08/18 1455        Objective:   Vitals:   03/16/18 2203 03/17/18 0900 03/17/18 1000 03/17/18 1317  BP: 101/73  105/82 104/69  Pulse: 98 (!) 102 100 96  Resp: 18  17 18   Temp: 98.2 F (36.8 C)     TempSrc: Oral  Oral   SpO2: 97% (!) 79% (!) 89% 100%  Weight:      Height:        Wt Readings from Last 3 Encounters:  03/16/18 71.7 kg     Intake/Output Summary (Last 24 hours) at 03/17/2018 1356 Last data filed at 03/17/2018 1300 Gross per 24 hour  Intake 483 ml  Output 1350 ml  Net -867 ml     Physical Exam  Gen: not in distress HEENT: Pallor present, moist mucosa, supple neck Chest: Fine bibasilar crackles CVS: N S1&S2, no murmurs,  GI: soft, NT, ND, BS+ Musculoskeletal: warm, 2+ pitting edema bilaterally CNS: AAOX3, non focal    Data  Review:    CBC Recent Labs  Lab 03/14/18 0545 03/15/18 0513 03/16/18 0452 03/17/18 0645  WBC 11.4* 25.3* 16.9* 12.0*  HGB 8.7* 9.8* 9.7* 9.1*  HCT 31.0* 34.4* 33.9* 31.6*  PLT 337 339 336 278  MCV 94.8 95.6 94.7 93.5  MCH 26.6 27.2 27.1 26.9  MCHC 28.1* 28.5* 28.6* 28.8*    RDW 19.7* 19.8* 20.1* 20.5*  LYMPHSABS 4.2* 2.7 5.3* 4.0  MONOABS 0.9 1.4* 1.2* 0.8  EOSABS 0.2 0.0 0.1 0.1  BASOSABS 0.1 0.1 0.1 0.1    Chemistries  Recent Labs  Lab 03/13/18 0441 03/14/18 0540 03/15/18 0513 03/16/18 0452 03/17/18 0645  NA 135 135 133* 134* 135  K 3.9 3.9 5.4* 5.2* 3.3*  CL 98 99 96* 96* 96*  CO2 30 29 19* 26 29  GLUCOSE 118* 119* 35* 79 115*  BUN 5* 6 14 25* 23*  CREATININE 0.40* 0.40* 0.87 0.87 0.60  CALCIUM 8.3* 8.5* 8.9 8.6* 8.1*  MG  --   --   --   --  1.4*  AST  --   --   --  90*  --   ALT  --   --   --  18  --   ALKPHOS  --   --   --  157*  --   BILITOT  --   --   --  0.5  --    ------------------------------------------------------------------------------------------------------------------ No results for input(s): CHOL, HDL, LDLCALC, TRIG, CHOLHDL, LDLDIRECT in the last 72 hours.  No results found for: HGBA1C ------------------------------------------------------------------------------------------------------------------ No results for input(s): TSH, T4TOTAL, T3FREE, THYROIDAB in the last 72 hours.  Invalid input(s): FREET3 ------------------------------------------------------------------------------------------------------------------ No results for input(s): VITAMINB12, FOLATE, FERRITIN, TIBC, IRON, RETICCTPCT in the last 72 hours.  Coagulation profile No results for input(s): INR, PROTIME in the last 168 hours.  No results for input(s): DDIMER in the last 72 hours.  Cardiac Enzymes No results for input(s): CKMB, TROPONINI, MYOGLOBIN in the last 168 hours.  Invalid input(s): CK ------------------------------------------------------------------------------------------------------------------    Component Value Date/Time   BNP 614.0 (H) 03/03/2018 0716    Inpatient Medications  Scheduled Meds:  buprenorphine-naloxone  1 tablet Sublingual BID   DULoxetine  30 mg Oral BID   feeding supplement  1 Container Oral BID BM    ferrous sulfate  325 mg Oral Q breakfast   furosemide  40 mg Oral BID   gabapentin  300 mg Oral TID   magnesium oxide  400 mg Oral BID   methocarbamol  750 mg Oral QID   metoprolol tartrate  75 mg Oral BID   multivitamin with minerals  1 tablet Oral Daily   pantoprazole  40 mg Oral BID   senna-docusate  2 tablet Oral BID   sodium chloride flush  10-40 mL Intracatheter Q12H   sodium chloride flush  3 mL Intravenous Q12H   Continuous Infusions:  sodium chloride 10 mL/hr at 03/15/18 1449    ceFAZolin (ANCEF) IV 2 g (03/17/18 1349)   PRN Meds:.sodium chloride, acetaminophen **OR** acetaminophen, albuterol, clonazePAM, guaiFENesin-dextromethorphan, ipratropium-albuterol, ondansetron **OR** ondansetron (ZOFRAN) IV, pentafluoroprop-tetrafluoroeth, prochlorperazine, sodium chloride flush, traZODone  Micro Results No results found for this or any previous visit (from the past 240 hour(s)).  Radiology Reports Dg Chest 2 View  Result Date: 03/11/2018 CLINICAL DATA:  Bacteremia.  Drug abuse. EXAM: CHEST - 2 VIEW COMPARISON:  March 01, 2018 FINDINGS: The right PICC line terminates in the SVC, unchanged. Bilateral patchy pulmonary infiltrates persist with moderate bilateral pleural effusions, stable.  The cardiomediastinal silhouette is unchanged. IMPRESSION: Persistent bilateral pleural effusions and patchy bilateral pulmonary infiltrates. Electronically Signed   By: Gerome Sam III M.D   On: 03/11/2018 15:54   Dg Chest 2 View  Result Date: 02/24/2018 CLINICAL DATA:  Short of breath EXAM: CHEST - 2 VIEW COMPARISON:  02/20/2018 FINDINGS: Upper normal heart size. Stable left pleural effusion. Tiny right pleural effusion is stable. Patchy airspace opacities throughout both lungs are not significantly changed. No pneumothorax. IMPRESSION: Bilateral airspace disease and bilateral pleural effusions left greater than right are stable. Electronically Signed   By: Jolaine Click M.D.   On:  02/24/2018 15:50   Mr Lumbar Spine W Wo Contrast  Result Date: 02/26/2018 CLINICAL DATA:  Low back pain since prior abnormal MRI 02/08/2018. Unable to walk. EXAM: MRI LUMBAR SPINE WITHOUT AND WITH CONTRAST TECHNIQUE: Multiplanar and multiecho pulse sequences of the lumbar spine were obtained without and with intravenous contrast. CONTRAST:  7 mL Gadavist COMPARISON:  02/08/2018 FINDINGS: Segmentation:  Standard. Alignment:  Physiologic. Vertebrae:  No fracture, evidence of discitis, or bone lesion. Conus medullaris and cauda equina: Conus extends to the L1 level. Conus and cauda equina appear normal. Paraspinal and other soft tissues: Posterior paraspinal muscle edema bilaterally with mild enhancement on postcontrast imaging. No intramuscular fluid collection or hematoma. Disc levels: Disc spaces: Disc spaces are maintained. T12-L1: No significant disc bulge. No evidence of neural foraminal stenosis. No central canal stenosis. L1-L2: No significant disc bulge. No evidence of neural foraminal stenosis. No central canal stenosis. L2-L3: No significant disc bulge. No evidence of neural foraminal stenosis. No central canal stenosis. L3-L4: No significant disc bulge. No evidence of neural foraminal stenosis. No central canal stenosis. L4-L5: Minimal broad-based disc bulge. No evidence of neural foraminal stenosis. No central canal stenosis. L5-S1: Minimal broad-based disc bulge. No evidence of neural foraminal stenosis. No central canal stenosis. IMPRESSION: 1. No epidural fluid collection.  No discitis or osteomyelitis. 2. Posterior paraspinal muscle edema bilaterally with mild enhancement on postcontrast imaging. No intramuscular fluid collection or hematoma. Differential considerations include muscle strain versus mild myositis. Electronically Signed   By: Elige Ko   On: 02/26/2018 09:53   Dg Chest Port 1 View  Result Date: 03/01/2018 CLINICAL DATA:  Shortness of breath EXAM: PORTABLE CHEST 1 VIEW  COMPARISON:  02/24/2018 FINDINGS: Cardiomegaly. Moderate to large bilateral pleural effusions, stable on the left, increasing on the right since prior study. Diffuse bilateral airspace disease has worsened since prior study and slightly more pronounced on the right. This could reflect asymmetric edema or pneumonia. IMPRESSION: Moderate to large bilateral pleural effusions, increasing on the right since prior study. Diffuse bilateral airspace disease, right greater than left, worsening since prior study. This could reflect asymmetric edema or pneumonia. Electronically Signed   By: Charlett Nose M.D.   On: 03/01/2018 18:14   Dg Chest Port 1 View  Result Date: 02/20/2018 CLINICAL DATA:  Sepsis, endocarditis, cavitary pneumonia with septic emboli and fever. EXAM: PORTABLE CHEST 1 VIEW COMPARISON:  02/08/2018 FINDINGS: Multiple cavitary lesions again identified in both lungs with associated airspace disease in a pattern likely representing septic emboli with multiple areas of cavitary infection. There is a new left lateral pleural effusion which appears likely loculated and may represent an empyema. Further evaluation with CT of the chest with contrast may be helpful. The heart size is stable. IMPRESSION: Persistent appearance of bilateral cavitary pneumonia likely representing septic emboli. There is a new loculated left lateral pleural effusion that  may represent a developing empyema. Further evaluation with CT of the chest with contrast may be helpful. Electronically Signed   By: Irish Lack M.D.   On: 02/20/2018 19:11   [place] Ekg Site Rite  Result Date: 02/26/2018 If Site Rite image not attached, placement could not be confirmed due to current cardiac rhythm.   Time Spent in minutes 25   Trevonne Nyland M.D on 03/17/2018 at 1:56 PM  Between 7am to 7pm - Pager - [phone]  After 7pm go to [email or web address] - password [email or web address]  [facility] -  Office  [phone]

## 2018-03-17 NOTE — Progress Notes (Addendum)
Dr. Gonzella Lex  Notified; pt will not allow for staff to flush her PICC line. She has refused her potassium,  lasix and evening blood glucose monitoring for today. Pt has been disrespectful to staff.

## 2018-03-17 NOTE — Progress Notes (Signed)
Patient refused AM vital signs.   

## 2018-03-17 NOTE — Progress Notes (Signed)
Patient refused to have her CBG done at 0300.

## 2018-03-18 DIAGNOSIS — Z9114 Patient's other noncompliance with medication regimen: Secondary | ICD-10-CM

## 2018-03-18 LAB — GLUCOSE, CAPILLARY
GLUCOSE-CAPILLARY: 98 mg/dL (ref 70–99)
Glucose-Capillary: 76 mg/dL (ref 70–99)
Glucose-Capillary: 114 mg/dL — ABNORMAL HIGH (ref 70–99)
Glucose-Capillary: 56 mg/dL — ABNORMAL LOW (ref 70–99)
Glucose-Capillary: 64 mg/dL — ABNORMAL LOW (ref 70–99)
Glucose-Capillary: 98 mg/dL (ref 70–99)

## 2018-03-18 NOTE — Progress Notes (Signed)
PROGRESS NOTE                                                                                                                                                                                                             Patient Demographics:    Amy Mcknight, is a 23 y.o. female, DOB - [birth year], ZOX:[id number]  Admit date - 02/08/2018   Admitting Physician Charlsie Quest, MD  Outpatient Primary MD for the patient is Patient, No Pcp Per  LOS - 38  Outpatient Specialists: None  No chief complaint on file.      Brief Narrative   23 year old female with history of substance use disorder presented to the ED with 2-day history of right lower back pain of acute onset with associated diaphoresis, chills, shortness of breath, productive cough and right chest wall pain.  She has active heroin use last use 1 month prior to admission, regular recreational drug use (snorting nasally), reports history of opiate withdrawal.  Urine drug screen was positive for marijuana and opiates. 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  Requiring hospital stay for 6 weeks of antibiotic therapy. Hospital course complicated with development of acute diastolic CHF and treatment noncompliance.      Subjective:   Denies shortness of breath but has been non-compliant with taking medication, refusing her potassium, Lasix and evening blood glucose monitoring for flushing the PICC line.  She also has been rude to the staff   Assessment  & Plan :    Principal Problem:   MSSA bacteremia Initial presentation was sepsis with septic shock.  Has tricuspid endocarditis, paravertebral myositis and cavitary pneumonia. Sepsis has resolved.  Case was discussed with ID who recommended inpatient IV antibiotic with Ancef for total 6 weeks duration until 3/23. Repeat blood  cultures have been negative.  Active Problems: Acute diastolic CHF. Gained 14 kg since admission.  Attempting to diurese with Lasix but intermittently refusing meds, getting I's and O's and regular vitals.     Echo shows severe TR with LVEF of 60-65%. Counseled again on importance of monitoring her weight, I's and O's and taking her meds.. Continue p.o. Lasix 40 mg twice daily (refusing IV). Cardiology consult appreciated.   Severe sepsis and cavitary pneumonia. Metastatic from MSSA bacteremia/endocarditis.  Hemodynamically stable.  Continue Ancef.   Leukocytosis Improving a.m. lab.  Last chest x-ray on 3/8 with persistent effusion.  Remains afebrile.  Hypokalemia Replenished.  Acute kidney injury Secondary to sepsis.  Serum creatinine peaked at 2.61.  Now resolved.  Positive hepatitis C antibody .  Quantitative viral load undetectable.  Polysubstance abuse with?  Opiate withdrawal. Avoiding all narcotics.  Was started on Suboxone on 3/11.  Generalized weakness and physical debility  Intermittent PT.  Patient refusing getting out of bed.  Anemia of chronic disease Stable.  Received 2 unit PRBC on 2/12.  No intervention per GI.  Continue PPI.  Received Feraheme this admission.  Bilateral lower extremity edema Possibly due to volume overload.  Patient refusing to get out of bed and not wearing SCDs.  Refused ultrasound to check for DVT.  Anxiety and depression Continue Cymbalta.     Code Status : Full code  Family Communication  : None at bedside  Disposition Plan  : Home after completion of IV antibiotic on 3/23  Barriers For Discharge : Inpatient IV antibiotic  Consults  : GI, cardiology  Procedures  : 2D echo, MRI lumbar spine,  DVT Prophylaxis  : SCDs  Lab Results  Component Value Date   PLT 278 03/17/2018    Antibiotics  :   Anti-infectives (From admission, onward)   Start     Dose/Rate Route Frequency Ordered Stop   02/09/18 1500  vancomycin  (VANCOCIN) IVPB 750 mg/150 ml premix  Status:  Discontinued     750 mg 150 mL/hr over 60 Minutes Intravenous Every 24 hours 02/08/18 1553 02/09/18 1201   02/09/18 1400  ceFAZolin (ANCEF) IVPB 2g/100 mL premix     2 g 200 mL/hr over 30 Minutes Intravenous Every 8 hours 02/09/18 1220 03/26/18 2359   02/09/18 1300  ceFAZolin (ANCEF) 2 g in dextrose 5 % 100 mL IVPB  Status:  Discontinued     2 g 200 mL/hr over 30 Minutes Intravenous Every 8 hours 02/09/18 1201 02/09/18 1220   02/09/18 0000  meropenem (MERREM) 1 g in sodium chloride 0.9 % 100 mL IVPB  Status:  Discontinued     1 g 200 mL/hr over 30 Minutes Intravenous Every 12 hours 02/08/18 1459 02/09/18 0853   02/08/18 1445  vancomycin (VANCOCIN) IVPB 1000 mg/200 mL premix     1,000 mg 200 mL/hr over 60 Minutes Intravenous  Once 02/08/18 1442 02/08/18 1555   02/08/18 1415  meropenem (MERREM) 1 g in sodium chloride 0.9 % 100 mL IVPB     1 g 200 mL/hr over 30 Minutes Intravenous  Once 02/08/18 1402 02/08/18 1455        Objective:   Vitals:   03/17/18 1000 03/17/18 1317 03/17/18 2021 03/18/18 0529  BP: 105/82 104/69 98/69 111/79  Pulse: 100 96 100 98  Resp: 17 18 20 20   Temp:   97.7 F (36.5 C) 98.1 F (36.7 C)  TempSrc: Oral  Oral Oral  SpO2: (!) 89% 100% 100% 93%  Weight:    72.1 kg  Height:        Wt Readings from Last 3 Encounters:  03/18/18 72.1 kg     Intake/Output Summary (Last 24 hours) at 03/18/2018 1049 Last data filed at 03/18/2018 0848 Gross per 24 hour  Intake 970 ml  Output 850 ml  Net 120 ml    Physical exam Not in distress HEENT: Pallor present, moist mucosa, supple neck Chest: Diminished bibasilar breath sounds CVs: Normal S1-S2, no murmurs GI: Soft,  nondistended nontender Musculoskeletal: Warm, 1+ pitting edema bilaterally CNS: Alert and oriented, nonfocal    Data Review:    CBC Recent Labs  Lab 03/14/18 0545 03/15/18 0513 03/16/18 0452 03/17/18 0645  WBC 11.4* 25.3* 16.9* 12.0*  HGB  8.7* 9.8* 9.7* 9.1*  HCT 31.0* 34.4* 33.9* 31.6*  PLT 337 339 336 278  MCV 94.8 95.6 94.7 93.5  MCH 26.6 27.2 27.1 26.9  MCHC 28.1* 28.5* 28.6* 28.8*  RDW 19.7* 19.8* 20.1* 20.5*  LYMPHSABS 4.2* 2.7 5.3* 4.0  MONOABS 0.9 1.4* 1.2* 0.8  EOSABS 0.2 0.0 0.1 0.1  BASOSABS 0.1 0.1 0.1 0.1    Chemistries  Recent Labs  Lab 03/13/18 0441 03/14/18 0540 03/15/18 0513 03/16/18 0452 03/17/18 0645  NA 135 135 133* 134* 135  K 3.9 3.9 5.4* 5.2* 3.3*  CL 98 99 96* 96* 96*  CO2 30 29 19* 26 29  GLUCOSE 118* 119* 35* 79 115*  BUN 5* 6 14 25* 23*  CREATININE 0.40* 0.40* 0.87 0.87 0.60  CALCIUM 8.3* 8.5* 8.9 8.6* 8.1*  MG  --   --   --   --  1.4*  AST  --   --   --  90*  --   ALT  --   --   --  18  --   ALKPHOS  --   --   --  157*  --   BILITOT  --   --   --  0.5  --    ------------------------------------------------------------------------------------------------------------------ No results for input(s): CHOL, HDL, LDLCALC, TRIG, CHOLHDL, LDLDIRECT in the last 72 hours.  No results found for: HGBA1C ------------------------------------------------------------------------------------------------------------------ No results for input(s): TSH, T4TOTAL, T3FREE, THYROIDAB in the last 72 hours.  Invalid input(s): FREET3 ------------------------------------------------------------------------------------------------------------------ No results for input(s): VITAMINB12, FOLATE, FERRITIN, TIBC, IRON, RETICCTPCT in the last 72 hours.  Coagulation profile No results for input(s): INR, PROTIME in the last 168 hours.  No results for input(s): DDIMER in the last 72 hours.  Cardiac Enzymes No results for input(s): CKMB, TROPONINI, MYOGLOBIN in the last 168 hours.  Invalid input(s): CK ------------------------------------------------------------------------------------------------------------------    Component Value Date/Time   BNP 614.0 (H) 03/03/2018 0716    Inpatient  Medications  Scheduled Meds:  buprenorphine-naloxone  1 tablet Sublingual BID   DULoxetine  30 mg Oral BID   feeding supplement  1 Container Oral BID BM   ferrous sulfate  325 mg Oral Q breakfast   furosemide  40 mg Oral BID   gabapentin  300 mg Oral TID   magnesium oxide  400 mg Oral BID   methocarbamol  750 mg Oral QID   metoprolol tartrate  75 mg Oral BID   multivitamin with minerals  1 tablet Oral Daily   pantoprazole  40 mg Oral BID   potassium chloride  40 mEq Oral Once   senna-docusate  2 tablet Oral BID   sodium chloride flush  10-40 mL Intracatheter Q12H   sodium chloride flush  3 mL Intravenous Q12H   Continuous Infusions:  sodium chloride 10 mL/hr at 03/15/18 1449    ceFAZolin (ANCEF) IV 2 g (03/18/18 0629)   PRN Meds:.sodium chloride, acetaminophen **OR** acetaminophen, albuterol, clonazePAM, guaiFENesin-dextromethorphan, ipratropium-albuterol, ondansetron **OR** ondansetron (ZOFRAN) IV, pentafluoroprop-tetrafluoroeth, prochlorperazine, sodium chloride flush, traZODone  Micro Results No results found for this or any previous visit (from the past 240 hour(s)).  Radiology Reports Dg Chest 2 View  Result Date: 03/11/2018 CLINICAL DATA:  Bacteremia.  Drug abuse. EXAM: CHEST - 2 VIEW COMPARISON:  March 01, 2018 FINDINGS: The right PICC line terminates in the SVC, unchanged. Bilateral patchy pulmonary infiltrates persist with moderate bilateral pleural effusions, stable. The cardiomediastinal silhouette is unchanged. IMPRESSION: Persistent bilateral pleural effusions and patchy bilateral pulmonary infiltrates. Electronically Signed   By: Gerome Sam III M.D   On: 03/11/2018 15:54   Dg Chest 2 View  Result Date: 02/24/2018 CLINICAL DATA:  Short of breath EXAM: CHEST - 2 VIEW COMPARISON:  02/20/2018 FINDINGS: Upper normal heart size. Stable left pleural effusion. Tiny right pleural effusion is stable. Patchy airspace opacities throughout both lungs are  not significantly changed. No pneumothorax. IMPRESSION: Bilateral airspace disease and bilateral pleural effusions left greater than right are stable. Electronically Signed   By: Jolaine Click M.D.   On: 02/24/2018 15:50   Mr Lumbar Spine W Wo Contrast  Result Date: 02/26/2018 CLINICAL DATA:  Low back pain since prior abnormal MRI 02/08/2018. Unable to walk. EXAM: MRI LUMBAR SPINE WITHOUT AND WITH CONTRAST TECHNIQUE: Multiplanar and multiecho pulse sequences of the lumbar spine were obtained without and with intravenous contrast. CONTRAST:  7 mL Gadavist COMPARISON:  02/08/2018 FINDINGS: Segmentation:  Standard. Alignment:  Physiologic. Vertebrae:  No fracture, evidence of discitis, or bone lesion. Conus medullaris and cauda equina: Conus extends to the L1 level. Conus and cauda equina appear normal. Paraspinal and other soft tissues: Posterior paraspinal muscle edema bilaterally with mild enhancement on postcontrast imaging. No intramuscular fluid collection or hematoma. Disc levels: Disc spaces: Disc spaces are maintained. T12-L1: No significant disc bulge. No evidence of neural foraminal stenosis. No central canal stenosis. L1-L2: No significant disc bulge. No evidence of neural foraminal stenosis. No central canal stenosis. L2-L3: No significant disc bulge. No evidence of neural foraminal stenosis. No central canal stenosis. L3-L4: No significant disc bulge. No evidence of neural foraminal stenosis. No central canal stenosis. L4-L5: Minimal broad-based disc bulge. No evidence of neural foraminal stenosis. No central canal stenosis. L5-S1: Minimal broad-based disc bulge. No evidence of neural foraminal stenosis. No central canal stenosis. IMPRESSION: 1. No epidural fluid collection.  No discitis or osteomyelitis. 2. Posterior paraspinal muscle edema bilaterally with mild enhancement on postcontrast imaging. No intramuscular fluid collection or hematoma. Differential considerations include muscle strain versus  mild myositis. Electronically Signed   By: Elige Ko   On: 02/26/2018 09:53   Dg Chest Port 1 View  Result Date: 03/01/2018 CLINICAL DATA:  Shortness of breath EXAM: PORTABLE CHEST 1 VIEW COMPARISON:  02/24/2018 FINDINGS: Cardiomegaly. Moderate to large bilateral pleural effusions, stable on the left, increasing on the right since prior study. Diffuse bilateral airspace disease has worsened since prior study and slightly more pronounced on the right. This could reflect asymmetric edema or pneumonia. IMPRESSION: Moderate to large bilateral pleural effusions, increasing on the right since prior study. Diffuse bilateral airspace disease, right greater than left, worsening since prior study. This could reflect asymmetric edema or pneumonia. Electronically Signed   By: Charlett Nose M.D.   On: 03/01/2018 18:14   Dg Chest Port 1 View  Result Date: 02/20/2018 CLINICAL DATA:  Sepsis, endocarditis, cavitary pneumonia with septic emboli and fever. EXAM: PORTABLE CHEST 1 VIEW COMPARISON:  02/08/2018 FINDINGS: Multiple cavitary lesions again identified in both lungs with associated airspace disease in a pattern likely representing septic emboli with multiple areas of cavitary infection. There is a new left lateral pleural effusion which appears likely loculated and may represent an empyema. Further evaluation with CT of the chest with contrast may be helpful. The heart  size is stable. IMPRESSION: Persistent appearance of bilateral cavitary pneumonia likely representing septic emboli. There is a new loculated left lateral pleural effusion that may represent a developing empyema. Further evaluation with CT of the chest with contrast may be helpful. Electronically Signed   By: Irish Lack M.D.   On: 02/20/2018 19:11   [place] Ekg Site Rite  Result Date: 02/26/2018 If Site Rite image not attached, placement could not be confirmed due to current cardiac rhythm.   Time Spent in minutes 25   Tekia Waterbury M.D on  03/18/2018 at 10:49 AM  Between 7am to 7pm - Pager - [phone]  After 7pm go to [email or web address] - password [facility]  [facility] -  Office  [phone]

## 2018-03-18 NOTE — Progress Notes (Signed)
Pt has been rude to the staff, verbally abusive. Refused to take a bath, has been coughing up sputum on her linen and gown. Encouraged pt to use the sputum container and  allow the staff to take her blood sugar for monitoring. Glucose was 68 but refused to drink juice. She has refused all the staff RNs to flush her PICC line. She is stating that we are giving her IV stuff to make her pee. Generalized weakness and 3+ edema noted from abdominal area to feet. She continues to urinate in the bed as she refuses to get out of bed to use the [facility] or bathroom. Will continue to monitor and assist w/ care.

## 2018-03-19 LAB — GLUCOSE, CAPILLARY
GLUCOSE-CAPILLARY: 86 mg/dL (ref 70–99)
Glucose-Capillary: 90 mg/dL (ref 70–99)
Glucose-Capillary: 79 mg/dL (ref 70–99)
Glucose-Capillary: 158 mg/dL — ABNORMAL HIGH (ref 70–99)
Glucose-Capillary: 105 mg/dL — ABNORMAL HIGH (ref 70–99)

## 2018-03-19 MED ORDER — KETOROLAC TROMETHAMINE 30 MG/ML IJ SOLN
30.0000 mg | Freq: Four times a day (QID) | INTRAMUSCULAR | Status: AC | PRN
  Administered 2018-03-19 – 2018-03-23 (×7): 30 mg via INTRAVENOUS
  Filled 2018-03-19 (×7): qty 1

## 2018-03-19 MED ORDER — GABAPENTIN 100 MG PO CAPS
100.0000 mg | ORAL_CAPSULE | Freq: Three times a day (TID) | ORAL | Status: DC
  Administered 2018-03-19 – 2018-03-26 (×20): 100 mg via ORAL
  Filled 2018-03-19 (×20): qty 1

## 2018-03-19 NOTE — Progress Notes (Signed)
Attempted to have patient turn to assess back. Refused. Pulled up in bed with assist x2 Juline Patch, RN). Tol well.

## 2018-03-19 NOTE — Progress Notes (Signed)
Patient continues to refuse sitting up on side of bed, getting out of bed to chair and/or ambulating in room. Nursing provided education and encouragement, patient states "I'm not getting up, you can't make me." patient refuses skin assessment as well. MD aware. Earnstine Regal, RN

## 2018-03-19 NOTE — Progress Notes (Signed)
PT Cancellation Note  Patient Details Name: Amy Mcknight MRN: [id number] DOB: Jul 16, 1995   Cancelled Treatment:    Reason Eval/Treat Not Completed: Patient declined, no reason specified.  Patient refused therapy despite much encouragement - RN present.   3:41 PM, 03/19/18 Ocie Bob, MPT Physical Therapist with [facility] [phone] office [phone] mobile phone

## 2018-03-19 NOTE — Progress Notes (Signed)
**Note De-Identified Amy Mcknight Obfuscation** RT attempted blood draw for ABG 2x; patient refusing.

## 2018-03-19 NOTE — Progress Notes (Signed)
Received call back from Dr. Kerry Hough. Updated on patient status and behavior. Orders received to check CBG and ABG. CBG 79, encouraged patient to drink something and offered to assist with sitting up to eat breakfast. Pt refused assistance with sitting up, stated "I'm not moving". RT notified of ABG order. Patient had removed O2 again but reapplied with nursing reminder. Earnstine Regal, RN

## 2018-03-19 NOTE — Progress Notes (Signed)
Patient refused ABG per RT. Notified Dr. Kerry Hough. Earnstine Regal, RN

## 2018-03-19 NOTE — Progress Notes (Signed)
Patient has continued to refuse sitting on side of bed or getting out of bed today. MD aware. Patient refused to turn and reposition for skin assessment this morning, stated "I'm not turning because it hurts. I'm not doing it." discussed importance of turning/repositioning and mobility to decrease risk of skin breakdown. Will continue to encourage movement with assistance and educate as needed. bedalarm remains in place for safety. Tele-sitter in place for safety as well. Earnstine Regal, RN

## 2018-03-19 NOTE — Progress Notes (Signed)
Patient requested nursing assistance to get up to bedside commode to void. Assisted up with standby assist. Tolerated fairly well, refused to try sitting up in chair. Refused standup scale for daily weight to be obtained. Bed zeroed and weight obtained via bed scale on return to bed. Sacral dressing noted to be clean, dry and intact. Patient refused to have it removed for skin assessment. bedalarm on for safety. Call light and personal items in place. Earnstine Regal, RN

## 2018-03-19 NOTE — Progress Notes (Signed)
PROGRESS NOTE                                                                                                                                                                                                             Patient Demographics:    Amy Mcknight, is a 23 y.o. female, DOB - 1995-01-18, ZOX:[id number]  Admit date - 02/08/2018   Admitting Physician Charlsie Quest, MD  Outpatient Primary MD for the patient is Patient, No Pcp Per  LOS - 39  Outpatient Specialists: None  No chief complaint on file.      Brief Narrative   23 year old female with history of substance use disorder presented to the ED with 2-day history of right lower back pain of acute onset with associated diaphoresis, chills, shortness of breath, productive cough and right chest wall pain.  She has active heroin use last use 1 month prior to admission, regular recreational drug use (snorting nasally), reports history of opiate withdrawal.  Urine drug screen was positive for marijuana and opiates. 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  Requiring hospital stay for 6 weeks of antibiotic therapy. Hospital course complicated with development of acute diastolic CHF and treatment noncompliance.      Subjective:   Refuses to get out of bed. Continues to complain of back pain and pain in legs. Denies any shortness of breath. Becomes hypoxic when nasal cannula is removed   Assessment  & Plan :    Principal Problem:   MSSA bacteremia Initial presentation was sepsis with septic shock.  Has tricuspid endocarditis, paravertebral myositis and cavitary pneumonia. Sepsis has resolved.  Case was discussed with ID who recommended inpatient IV antibiotic with Ancef for total 6 weeks duration until 3/23. Repeat blood cultures have been negative.  Active Problems:  Acute diastolic CHF. Gained 14 kg since admission.  Attempting to diurese with Lasix but intermittently refusing meds, getting I's and O's and regular vitals.     Echo shows severe TR with LVEF of 60-65%. Counseled again on importance of monitoring her weight, I's and O's and taking her meds.. Continue p.o. Lasix 40 mg twice daily (refusing IV). Cardiology consult appreciated.  Severe sepsis and cavitary pneumonia. Metastatic from MSSA bacteremia/endocarditis.  Hemodynamically stable.  Continue Ancef.  Leukocytosis Improving a.m. lab.  Last chest x-ray on 3/8 with persistent effusion.  Remains afebrile.  Hypokalemia Replenished.  Acute kidney injury Secondary to sepsis.  Serum creatinine peaked at 2.61.  Now resolved.  Positive hepatitis C antibody Quantitative viral load undetectable.  Polysubstance abuse with?  Opiate withdrawal. Avoiding all narcotics.  Was started on Suboxone on 3/11.  Generalized weakness and physical debility  Intermittent PT.  Patient refusing getting out of bed.  Anemia of chronic disease Stable.  Received 2 unit PRBC on 2/12.  No intervention per GI.  Continue PPI.  Received Feraheme this admission.  Bilateral lower extremity edema Possibly due to volume overload.  Patient refusing to get out of bed and not wearing SCDs.  Refused ultrasound to check for DVT. Often refuses lasix.  Anxiety and depression Continue Cymbalta.  Acute respiratory failure with hypoxia.  Patient becomes hypoxic when oxygen is removed. Patient often removes nasal cannula and is resistant to when she is asked to reapply this. Hypoxia likely related to pleural effusions noted prior chest xray.  Acute encephalopathy Patient noted to be lethargic this morning. Possibly related to hypoxia as well as medications. Will discontinue robaxin and decrease gabapentin dosing.     Code Status : Full code  Family Communication  : None at bedside  Disposition Plan  : Home after  completion of IV antibiotic on 3/23  Barriers For Discharge : Inpatient IV antibiotic  Consults  : GI, cardiology  Procedures  : 2D echo, MRI lumbar spine,  DVT Prophylaxis  : SCDs  Lab Results  Component Value Date   PLT 278 03/17/2018    Antibiotics  :   Anti-infectives (From admission, onward)   Start     Dose/Rate Route Frequency Ordered Stop   02/09/18 1500  vancomycin (VANCOCIN) IVPB 750 mg/150 ml premix  Status:  Discontinued     750 mg 150 mL/hr over 60 Minutes Intravenous Every 24 hours 02/08/18 1553 02/09/18 1201   02/09/18 1400  ceFAZolin (ANCEF) IVPB 2g/100 mL premix     2 g 200 mL/hr over 30 Minutes Intravenous Every 8 hours 02/09/18 1220 03/26/18 2359   02/09/18 1300  ceFAZolin (ANCEF) 2 g in dextrose 5 % 100 mL IVPB  Status:  Discontinued     2 g 200 mL/hr over 30 Minutes Intravenous Every 8 hours 02/09/18 1201 02/09/18 1220   02/09/18 0000  meropenem (MERREM) 1 g in sodium chloride 0.9 % 100 mL IVPB  Status:  Discontinued     1 g 200 mL/hr over 30 Minutes Intravenous Every 12 hours 02/08/18 1459 02/09/18 0853   02/08/18 1445  vancomycin (VANCOCIN) IVPB 1000 mg/200 mL premix     1,000 mg 200 mL/hr over 60 Minutes Intravenous  Once 02/08/18 1442 02/08/18 1555   02/08/18 1415  meropenem (MERREM) 1 g in sodium chloride 0.9 % 100 mL IVPB     1 g 200 mL/hr over 30 Minutes Intravenous  Once 02/08/18 1402 02/08/18 1455        Objective:   Vitals:   03/19/18 0522 03/19/18 0900 03/19/18 0903 03/19/18 1404  BP: 103/68  104/80 97/77  Pulse: 92  96 88  Resp:   18 18  Temp: 98.2 F (36.8 C)  98.3 F (36.8 C) (!) 97.4 F (36.3 C)  TempSrc:   Oral Oral  SpO2: 93% (!) 82% 99% 95%  Weight:      Height:        Wt Readings from Last 3 Encounters:  03/18/18 72.1 kg  Intake/Output Summary (Last 24 hours) at 03/19/2018 1802 Last data filed at 03/19/2018 1647 Gross per 24 hour  Intake 680 ml  Output 400 ml  Net 280 ml    Physical exam General exam:  Alert, awake, oriented x 3 Respiratory system: diminished breath sounds at bases. Respiratory effort normal. Cardiovascular system:RRR. No murmurs, rubs, gallops. Gastrointestinal system: Abdomen is nondistended, soft and nontender. No organomegaly or masses felt. Normal bowel sounds heard. Central nervous system: Alert and oriented. No focal neurological deficits. Extremities: 1+ edema bilaterally Skin: No rashes, lesions or ulcers Psychiatry: Judgement and insight appear normal. Mood & affect appropriate.      Data Review:    CBC Recent Labs  Lab 03/14/18 0545 03/15/18 0513 03/16/18 0452 03/17/18 0645  WBC 11.4* 25.3* 16.9* 12.0*  HGB 8.7* 9.8* 9.7* 9.1*  HCT 31.0* 34.4* 33.9* 31.6*  PLT 337 339 336 278  MCV 94.8 95.6 94.7 93.5  MCH 26.6 27.2 27.1 26.9  MCHC 28.1* 28.5* 28.6* 28.8*  RDW 19.7* 19.8* 20.1* 20.5*  LYMPHSABS 4.2* 2.7 5.3* 4.0  MONOABS 0.9 1.4* 1.2* 0.8  EOSABS 0.2 0.0 0.1 0.1  BASOSABS 0.1 0.1 0.1 0.1    Chemistries  Recent Labs  Lab 03/13/18 0441 03/14/18 0540 03/15/18 0513 03/16/18 0452 03/17/18 0645  NA 135 135 133* 134* 135  K 3.9 3.9 5.4* 5.2* 3.3*  CL 98 99 96* 96* 96*  CO2 30 29 19* 26 29  GLUCOSE 118* 119* 35* 79 115*  BUN 5* 6 14 25* 23*  CREATININE 0.40* 0.40* 0.87 0.87 0.60  CALCIUM 8.3* 8.5* 8.9 8.6* 8.1*  MG  --   --   --   --  1.4*  AST  --   --   --  90*  --   ALT  --   --   --  18  --   ALKPHOS  --   --   --  157*  --   BILITOT  --   --   --  0.5  --    ------------------------------------------------------------------------------------------------------------------ No results for input(s): CHOL, HDL, LDLCALC, TRIG, CHOLHDL, LDLDIRECT in the last 72 hours.  No results found for: HGBA1C ------------------------------------------------------------------------------------------------------------------ No results for input(s): TSH, T4TOTAL, T3FREE, THYROIDAB in the last 72 hours.  Invalid input(s): FREET3  ------------------------------------------------------------------------------------------------------------------ No results for input(s): VITAMINB12, FOLATE, FERRITIN, TIBC, IRON, RETICCTPCT in the last 72 hours.  Coagulation profile No results for input(s): INR, PROTIME in the last 168 hours.  No results for input(s): DDIMER in the last 72 hours.  Cardiac Enzymes No results for input(s): CKMB, TROPONINI, MYOGLOBIN in the last 168 hours.  Invalid input(s): CK ------------------------------------------------------------------------------------------------------------------    Component Value Date/Time   BNP 614.0 (H) 03/03/2018 0716    Inpatient Medications  Scheduled Meds: . buprenorphine-naloxone  1 tablet Sublingual BID  . DULoxetine  30 mg Oral BID  . feeding supplement  1 Container Oral BID BM  . ferrous sulfate  325 mg Oral Q breakfast  . furosemide  40 mg Oral BID  . gabapentin  100 mg Oral TID  . magnesium oxide  400 mg Oral BID  . metoprolol tartrate  75 mg Oral BID  . multivitamin with minerals  1 tablet Oral Daily  . pantoprazole  40 mg Oral BID  . potassium chloride  40 mEq Oral Once  . senna-docusate  2 tablet Oral BID  . sodium chloride flush  10-40 mL Intracatheter Q12H   Continuous Infusions: . sodium chloride Stopped (03/19/18 1641)  .  ceFAZolin (ANCEF) IV Stopped (03/19/18 1542)   PRN Meds:.sodium chloride, acetaminophen **OR** acetaminophen, albuterol, guaiFENesin-dextromethorphan, ipratropium-albuterol, ketorolac, ondansetron **OR** ondansetron (ZOFRAN) IV, pentafluoroprop-tetrafluoroeth, prochlorperazine, sodium chloride flush, traZODone  Micro Results No results found for this or any previous visit (from the past 240 hour(s)).  Radiology Reports Dg Chest 2 View  Result Date: 03/11/2018 CLINICAL DATA:  Bacteremia.  Drug abuse. EXAM: CHEST - 2 VIEW COMPARISON:  March 01, 2018 FINDINGS: The right PICC line terminates in the SVC, unchanged.  Bilateral patchy pulmonary infiltrates persist with moderate bilateral pleural effusions, stable. The cardiomediastinal silhouette is unchanged. IMPRESSION: Persistent bilateral pleural effusions and patchy bilateral pulmonary infiltrates. Electronically Signed   By: Gerome Sam III M.D   On: 03/11/2018 15:54   Dg Chest 2 View  Result Date: 02/24/2018 CLINICAL DATA:  Short of breath EXAM: CHEST - 2 VIEW COMPARISON:  02/20/2018 FINDINGS: Upper normal heart size. Stable left pleural effusion. Tiny right pleural effusion is stable. Patchy airspace opacities throughout both lungs are not significantly changed. No pneumothorax. IMPRESSION: Bilateral airspace disease and bilateral pleural effusions left greater than right are stable. Electronically Signed   By: Jolaine Click M.D.   On: 02/24/2018 15:50   Mr Lumbar Spine W Wo Contrast  Result Date: 02/26/2018 CLINICAL DATA:  Low back pain since prior abnormal MRI 02/08/2018. Unable to walk. EXAM: MRI LUMBAR SPINE WITHOUT AND WITH CONTRAST TECHNIQUE: Multiplanar and multiecho pulse sequences of the lumbar spine were obtained without and with intravenous contrast. CONTRAST:  7 mL Gadavist COMPARISON:  02/08/2018 FINDINGS: Segmentation:  Standard. Alignment:  Physiologic. Vertebrae:  No fracture, evidence of discitis, or bone lesion. Conus medullaris and cauda equina: Conus extends to the L1 level. Conus and cauda equina appear normal. Paraspinal and other soft tissues: Posterior paraspinal muscle edema bilaterally with mild enhancement on postcontrast imaging. No intramuscular fluid collection or hematoma. Disc levels: Disc spaces: Disc spaces are maintained. T12-L1: No significant disc bulge. No evidence of neural foraminal stenosis. No central canal stenosis. L1-L2: No significant disc bulge. No evidence of neural foraminal stenosis. No central canal stenosis. L2-L3: No significant disc bulge. No evidence of neural foraminal stenosis. No central canal stenosis.  L3-L4: No significant disc bulge. No evidence of neural foraminal stenosis. No central canal stenosis. L4-L5: Minimal broad-based disc bulge. No evidence of neural foraminal stenosis. No central canal stenosis. L5-S1: Minimal broad-based disc bulge. No evidence of neural foraminal stenosis. No central canal stenosis. IMPRESSION: 1. No epidural fluid collection.  No discitis or osteomyelitis. 2. Posterior paraspinal muscle edema bilaterally with mild enhancement on postcontrast imaging. No intramuscular fluid collection or hematoma. Differential considerations include muscle strain versus mild myositis. Electronically Signed   By: Elige Ko   On: 02/26/2018 09:53   Dg Chest Port 1 View  Result Date: 03/01/2018 CLINICAL DATA:  Shortness of breath EXAM: PORTABLE CHEST 1 VIEW COMPARISON:  02/24/2018 FINDINGS: Cardiomegaly. Moderate to large bilateral pleural effusions, stable on the left, increasing on the right since prior study. Diffuse bilateral airspace disease has worsened since prior study and slightly more pronounced on the right. This could reflect asymmetric edema or pneumonia. IMPRESSION: Moderate to large bilateral pleural effusions, increasing on the right since prior study. Diffuse bilateral airspace disease, right greater than left, worsening since prior study. This could reflect asymmetric edema or pneumonia. Electronically Signed   By: Charlett Nose M.D.   On: 03/01/2018 18:14   Dg Chest Port 1 View  Result Date: 02/20/2018 CLINICAL DATA:  Sepsis, endocarditis, cavitary  pneumonia with septic emboli and fever. EXAM: PORTABLE CHEST 1 VIEW COMPARISON:  02/08/2018 FINDINGS: Multiple cavitary lesions again identified in both lungs with associated airspace disease in a pattern likely representing septic emboli with multiple areas of cavitary infection. There is a new left lateral pleural effusion which appears likely loculated and may represent an empyema. Further evaluation with CT of the chest with  contrast may be helpful. The heart size is stable. IMPRESSION: Persistent appearance of bilateral cavitary pneumonia likely representing septic emboli. There is a new loculated left lateral pleural effusion that may represent a developing empyema. Further evaluation with CT of the chest with contrast may be helpful. Electronically Signed   By: Irish LackGlenn  Yamagata M.D.   On: 02/20/2018 19:11   [place] Ekg Site Rite  Result Date: 02/26/2018 If Site Rite image not attached, placement could not be confirmed due to current cardiac rhythm.   Time Spent in minutes 25   Erick BlinksJehanzeb Kaylanie Capili M.D on 03/19/2018 at 6:02 PM  Between 7am to 7pm   After 7pm go to [email or web address]   [facility] -  Office  [phone]

## 2018-03-19 NOTE — Progress Notes (Signed)
Patient noted to have removed O2 on assessment this am. O2 sats 82% on r/a, patient asleep, arousable but irritable/agitated during assessment. Attempted to reapply O2 and patient jerked O2 away from nurse stating "I can do it my self". Patient falling asleep while attempting to apply O2. Nursing offered to assist to which patient replied "I told you I've got it". Vitals checked, see flowsheet. O2 sats 98-99% on 4 lpm once O2 reapplied. Educated patient about keeping O2 in place, will need frequent reminders since she has been frequently noncompliant. Patient refusing to sit up on side of bed or chair for meals per report. Morning medications not given at this time since patient sleepy/drowsy. Patient irritable and inappropriate with responses to nurse. Asked to be left alone.Text paged Dr. Kerry Hough to notify. Earnstine Regal, RN

## 2018-03-20 LAB — COMPREHENSIVE METABOLIC PANEL
ALT: 17 U/L (ref 0–44)
AST: 98 U/L — ABNORMAL HIGH (ref 15–41)
Albumin: 2.1 g/dL — ABNORMAL LOW (ref 3.5–5.0)
Alkaline Phosphatase: 123 U/L (ref 38–126)
Anion gap: 9 (ref 5–15)
BUN: 19 mg/dL (ref 6–20)
CO2: 30 mmol/L (ref 22–32)
Calcium: 8.3 mg/dL — ABNORMAL LOW (ref 8.9–10.3)
Chloride: 97 mmol/L — ABNORMAL LOW (ref 98–111)
Creatinine, Ser: 0.59 mg/dL (ref 0.44–1.00)
GFR calc Af Amer: >60 mL/min (ref >60)
GFR calc non Af Amer: >60 mL/min (ref >60)
Glucose, Bld: 98 mg/dL (ref 70–99)
Potassium: 3.9 mmol/L (ref 3.5–5.1)
Sodium: 136 mmol/L (ref 135–145)
Total Bilirubin: 0.4 mg/dL (ref 0.3–1.2)
Total Protein: 7 g/dL (ref 6.5–8.1)

## 2018-03-20 LAB — CBC
HCT: 35.5 % — ABNORMAL LOW (ref 36.0–46.0)
Hemoglobin: 10 g/dL — ABNORMAL LOW (ref 12.0–15.0)
MCH: 27.1 pg (ref 26.0–34.0)
MCHC: 28.2 g/dL — ABNORMAL LOW (ref 30.0–36.0)
MCV: 96.2 fL (ref 80.0–100.0)
PLATELETS: 249 10*3/uL (ref 150–400)
RBC: 3.69 MIL/uL — ABNORMAL LOW (ref 3.87–5.11)
RDW: 20.9 % — ABNORMAL HIGH (ref 11.5–15.5)
WBC: 9.8 10*3/uL (ref 4.0–10.5)
nRBC: 0.6 % — ABNORMAL HIGH (ref 0.0–0.2)

## 2018-03-20 LAB — GLUCOSE, CAPILLARY
Glucose-Capillary: 74 mg/dL (ref 70–99)
Glucose-Capillary: 106 mg/dL — ABNORMAL HIGH (ref 70–99)

## 2018-03-20 LAB — MAGNESIUM: MAGNESIUM: 1.6 mg/dL — AB (ref 1.7–2.4)

## 2018-03-20 NOTE — Progress Notes (Signed)
CBG 74. Eating applesauce and drinking Sprite. Alert.

## 2018-03-20 NOTE — Progress Notes (Signed)
CSW visited with pt to discuss discharge needs. CSW used gentle confrontation to approach Pt about her resistance to PT, ambulation and other forms of independent activities. Pt was guarded and responded with uncertainty to most of CSW's questions. Although Pt stated that she could ambulate to the bathroom,  Pt expressed that she was unsure how she would ambulate around considering her weakness. CSW explained that the longer she refused to engage in PT and to ambulate, the weaker she would become thus making it difficult to function once discharged. CSW provided Pt with a list of of resources located in [place] for once she discharges. Pt stated that she would probably not seek the treatment suggested once she arrives in [place]. During gentle confrontation, Pt responded " I dont care" and "Im a drug addict". Pt went into detail and explained that she felt depressed at this time and did not feel like talking. CSW suggested that she would revisit the conversation at a later date prior to her discharge. Pt was receptive to this suggestion. Assigned CSW will continue to follow up on Pt's discharge needs.   Laurens Matheny Sherryle Lis LCSWA Clinical Social Worker

## 2018-03-20 NOTE — Clinical Social Work Note (Signed)
LCSW spoke with patient's mother, Mrs. Talluto, who confirmed that the patient's plan continues to be move to [place], [place]., with her aunt, Bettey Mare. Ms. Derinda Late will arrive in town on Sunday and will likely come to the hospital on Monday.    LCSW located the following resources in [place], [place], and provided it to patient.  Resources in [place], [place]  [facility] (they provide mental health counseling) [phone]  [facility] (they provide inpatient and outpatient substance abuse services) [phone]  [facility] (they provide outpatient substance abuse services) [phone]   (Each of these providers provide sliding fee scale payment for minimal to no cost services)    Revan Gendron, Juleen China, LCSW

## 2018-03-20 NOTE — Progress Notes (Signed)
PROGRESS NOTE                                                                                                                                                                                                             Patient Demographics:    Amy Mcknight, is a 23 y.o. female, DOB - 1995-03-31, ZOX:[id number]  Admit date - 02/08/2018   Admitting Physician Charlsie QuestVishal R Patel, MD  Outpatient Primary MD for the patient is Patient, No Pcp Per  LOS - 40  Outpatient Specialists: None  No chief complaint on file.      Brief Narrative   23 year old female with history of substance use disorder presented to the ED with 2-day history of right lower back pain of acute onset with associated diaphoresis, chills, shortness of breath, productive cough and right chest wall pain.  She has active heroin use last use 1 month prior to admission, regular recreational drug use (snorting nasally), reports history of opiate withdrawal.  Urine drug screen was positive for marijuana and opiates. 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  Requiring hospital stay for 6 weeks of antibiotic therapy. Hospital course complicated with development of acute diastolic CHF and treatment noncompliance.      Subjective:   Reports that back pain is better today. Is willing to try and ambulate later today   Assessment  & Plan :    Principal Problem:   MSSA bacteremia Initial presentation was sepsis with septic shock.  Has tricuspid endocarditis, paravertebral myositis and cavitary pneumonia. Sepsis has resolved.  Case was discussed with ID who recommended inpatient IV antibiotic with Ancef for total 6 weeks duration until 3/23. Repeat blood cultures have been negative.  Active Problems: Acute diastolic CHF. Gained 14 kg since admission.  Attempting to diurese  with Lasix but intermittently refusing meds, getting I's and O's and regular vitals.     Echo shows severe TR with LVEF of 60-65%. Counseled again on importance of monitoring her weight, I's and O's and taking her meds.. Continue p.o. Lasix 40 mg twice daily (refusing IV). Cardiology consult appreciated.  Severe sepsis and cavitary pneumonia. Metastatic from MSSA bacteremia/endocarditis.  Hemodynamically stable.  Continue Ancef.  Leukocytosis Improving a.m. lab.  Last chest x-ray on 3/8 with persistent effusion.  Remains afebrile.  Hypokalemia Replenished.  Acute kidney injury Secondary to sepsis.  Serum creatinine peaked at 2.61.  Now resolved.  Positive hepatitis C antibody Quantitative viral load undetectable.  Polysubstance abuse with?  Opiate withdrawal. Avoiding all narcotics.  Was started on Suboxone on 3/11.  Generalized weakness and physical debility  Intermittent PT.  Patient refusing getting out of bed.  Anemia of chronic disease Stable.  Received 2 unit PRBC on 2/12.  No intervention per GI.  Continue PPI.  Received Feraheme this admission.  Bilateral lower extremity edema Possibly due to volume overload.  Patient refusing to get out of bed and not wearing SCDs.  Refused ultrasound to check for DVT. Often refuses lasix.  Anxiety and depression Continue Cymbalta.  Acute respiratory failure with hypoxia.  Patient becomes hypoxic when oxygen is removed. Patient often removes nasal cannula and is resistant to when she is asked to reapply this. Hypoxia likely related to pleural effusions noted prior chest xray.  Acute encephalopathy Patient noted to be lethargic this morning. Possibly related to hypoxia as well as medications. Will discontinue robaxin and decrease gabapentin dosing. Mental status is improving today     Code Status : Full code  Family Communication  : None at bedside  Disposition Plan  : Home after completion of IV antibiotic on 3/23  Barriers  For Discharge : Inpatient IV antibiotic  Consults  : GI, cardiology  Procedures  : 2D echo, MRI lumbar spine,  DVT Prophylaxis  : SCDs  Lab Results  Component Value Date   PLT 249 03/20/2018    Antibiotics  :   Anti-infectives (From admission, onward)   Start     Dose/Rate Route Frequency Ordered Stop   02/09/18 1500  vancomycin (VANCOCIN) IVPB 750 mg/150 ml premix  Status:  Discontinued     750 mg 150 mL/hr over 60 Minutes Intravenous Every 24 hours 02/08/18 1553 02/09/18 1201   02/09/18 1400  ceFAZolin (ANCEF) IVPB 2g/100 mL premix     2 g 200 mL/hr over 30 Minutes Intravenous Every 8 hours 02/09/18 1220 03/26/18 2359   02/09/18 1300  ceFAZolin (ANCEF) 2 g in dextrose 5 % 100 mL IVPB  Status:  Discontinued     2 g 200 mL/hr over 30 Minutes Intravenous Every 8 hours 02/09/18 1201 02/09/18 1220   02/09/18 0000  meropenem (MERREM) 1 g in sodium chloride 0.9 % 100 mL IVPB  Status:  Discontinued     1 g 200 mL/hr over 30 Minutes Intravenous Every 12 hours 02/08/18 1459 02/09/18 0853   02/08/18 1445  vancomycin (VANCOCIN) IVPB 1000 mg/200 mL premix     1,000 mg 200 mL/hr over 60 Minutes Intravenous  Once 02/08/18 1442 02/08/18 1555   02/08/18 1415  meropenem (MERREM) 1 g in sodium chloride 0.9 % 100 mL IVPB     1 g 200 mL/hr over 30 Minutes Intravenous  Once 02/08/18 1402 02/08/18 1455        Objective:   Vitals:   03/20/18 0248 03/20/18 0454 03/20/18 0940 03/20/18 1553  BP:  92/68 96/64 101/70  Pulse:  84 86 96  Resp:  17  18  Temp:    (!) 97.4 F (36.3 C)  TempSrc:    Oral  SpO2: 99% 100%  96%  Weight:      Height:        Wt Readings from Last 3 Encounters:  03/19/18 71.9 kg     Intake/Output Summary (Last 24 hours) at 03/20/2018 1938 Last data filed  at 03/20/2018 1528 Gross per 24 hour  Intake 614.99 ml  Output 300 ml  Net 314.99 ml    Physical exam General exam: Alert, awake, oriented x 3 Respiratory system: diminished breath sounds at bases.  Respiratory effort normal. Cardiovascular system:RRR. No murmurs, rubs, gallops. Gastrointestinal system: Abdomen is nondistended, soft and nontender. No organomegaly or masses felt. Normal bowel sounds heard. Central nervous system: Alert and oriented. No focal neurological deficits. Extremities: 1+ edema bilaterally Skin: No rashes, lesions or ulcers Psychiatry: Judgement and insight appear normal. Mood & affect appropriate.       Data Review:    CBC Recent Labs  Lab 03/14/18 0545 03/15/18 0513 03/16/18 0452 03/17/18 0645 03/20/18 0507  WBC 11.4* 25.3* 16.9* 12.0* 9.8  HGB 8.7* 9.8* 9.7* 9.1* 10.0*  HCT 31.0* 34.4* 33.9* 31.6* 35.5*  PLT 337 339 336 278 249  MCV 94.8 95.6 94.7 93.5 96.2  MCH 26.6 27.2 27.1 26.9 27.1  MCHC 28.1* 28.5* 28.6* 28.8* 28.2*  RDW 19.7* 19.8* 20.1* 20.5* 20.9*  LYMPHSABS 4.2* 2.7 5.3* 4.0  --   MONOABS 0.9 1.4* 1.2* 0.8  --   EOSABS 0.2 0.0 0.1 0.1  --   BASOSABS 0.1 0.1 0.1 0.1  --     Chemistries  Recent Labs  Lab 03/14/18 0540 03/15/18 0513 03/16/18 0452 03/17/18 0645 03/20/18 0507  NA 135 133* 134* 135 136  K 3.9 5.4* 5.2* 3.3* 3.9  CL 99 96* 96* 96* 97*  CO2 29 19* 26 29 30   GLUCOSE 119* 35* 79 115* 98  BUN 6 14 25* 23* 19  CREATININE 0.40* 0.87 0.87 0.60 0.59  CALCIUM 8.5* 8.9 8.6* 8.1* 8.3*  MG  --   --   --  1.4* 1.6*  AST  --   --  90*  --  98*  ALT  --   --  18  --  17  ALKPHOS  --   --  157*  --  123  BILITOT  --   --  0.5  --  0.4   ------------------------------------------------------------------------------------------------------------------ No results for input(s): CHOL, HDL, LDLCALC, TRIG, CHOLHDL, LDLDIRECT in the last 72 hours.  No results found for: HGBA1C ------------------------------------------------------------------------------------------------------------------ No results for input(s): TSH, T4TOTAL, T3FREE, THYROIDAB in the last 72 hours.  Invalid input(s): FREET3  ------------------------------------------------------------------------------------------------------------------ No results for input(s): VITAMINB12, FOLATE, FERRITIN, TIBC, IRON, RETICCTPCT in the last 72 hours.  Coagulation profile No results for input(s): INR, PROTIME in the last 168 hours.  No results for input(s): DDIMER in the last 72 hours.  Cardiac Enzymes No results for input(s): CKMB, TROPONINI, MYOGLOBIN in the last 168 hours.  Invalid input(s): CK ------------------------------------------------------------------------------------------------------------------    Component Value Date/Time   BNP 614.0 (H) 03/03/2018 0716    Inpatient Medications  Scheduled Meds: . buprenorphine-naloxone  1 tablet Sublingual BID  . DULoxetine  30 mg Oral BID  . feeding supplement  1 Container Oral BID BM  . ferrous sulfate  325 mg Oral Q breakfast  . furosemide  40 mg Oral BID  . gabapentin  100 mg Oral TID  . magnesium oxide  400 mg Oral BID  . metoprolol tartrate  75 mg Oral BID  . multivitamin with minerals  1 tablet Oral Daily  . pantoprazole  40 mg Oral BID  . potassium chloride  40 mEq Oral Once  . senna-docusate  2 tablet Oral BID  . sodium chloride flush  10-40 mL Intracatheter Q12H   Continuous Infusions: . sodium chloride Stopped (  03/19/18 1641)  .  ceFAZolin (ANCEF) IV 2 g (03/20/18 1506)   PRN Meds:.sodium chloride, acetaminophen **OR** acetaminophen, albuterol, guaiFENesin-dextromethorphan, ipratropium-albuterol, ketorolac, ondansetron **OR** ondansetron (ZOFRAN) IV, pentafluoroprop-tetrafluoroeth, prochlorperazine, sodium chloride flush, traZODone  Micro Results No results found for this or any previous visit (from the past 240 hour(s)).  Radiology Reports Dg Chest 2 View  Result Date: 03/11/2018 CLINICAL DATA:  Bacteremia.  Drug abuse. EXAM: CHEST - 2 VIEW COMPARISON:  March 01, 2018 FINDINGS: The right PICC line terminates in the SVC, unchanged. Bilateral  patchy pulmonary infiltrates persist with moderate bilateral pleural effusions, stable. The cardiomediastinal silhouette is unchanged. IMPRESSION: Persistent bilateral pleural effusions and patchy bilateral pulmonary infiltrates. Electronically Signed   By: Gerome Sam III M.D   On: 03/11/2018 15:54   Dg Chest 2 View  Result Date: 02/24/2018 CLINICAL DATA:  Short of breath EXAM: CHEST - 2 VIEW COMPARISON:  02/20/2018 FINDINGS: Upper normal heart size. Stable left pleural effusion. Tiny right pleural effusion is stable. Patchy airspace opacities throughout both lungs are not significantly changed. No pneumothorax. IMPRESSION: Bilateral airspace disease and bilateral pleural effusions left greater than right are stable. Electronically Signed   By: Jolaine Click M.D.   On: 02/24/2018 15:50   Mr Lumbar Spine W Wo Contrast  Result Date: 02/26/2018 CLINICAL DATA:  Low back pain since prior abnormal MRI 02/08/2018. Unable to walk. EXAM: MRI LUMBAR SPINE WITHOUT AND WITH CONTRAST TECHNIQUE: Multiplanar and multiecho pulse sequences of the lumbar spine were obtained without and with intravenous contrast. CONTRAST:  7 mL Gadavist COMPARISON:  02/08/2018 FINDINGS: Segmentation:  Standard. Alignment:  Physiologic. Vertebrae:  No fracture, evidence of discitis, or bone lesion. Conus medullaris and cauda equina: Conus extends to the L1 level. Conus and cauda equina appear normal. Paraspinal and other soft tissues: Posterior paraspinal muscle edema bilaterally with mild enhancement on postcontrast imaging. No intramuscular fluid collection or hematoma. Disc levels: Disc spaces: Disc spaces are maintained. T12-L1: No significant disc bulge. No evidence of neural foraminal stenosis. No central canal stenosis. L1-L2: No significant disc bulge. No evidence of neural foraminal stenosis. No central canal stenosis. L2-L3: No significant disc bulge. No evidence of neural foraminal stenosis. No central canal stenosis. L3-L4: No  significant disc bulge. No evidence of neural foraminal stenosis. No central canal stenosis. L4-L5: Minimal broad-based disc bulge. No evidence of neural foraminal stenosis. No central canal stenosis. L5-S1: Minimal broad-based disc bulge. No evidence of neural foraminal stenosis. No central canal stenosis. IMPRESSION: 1. No epidural fluid collection.  No discitis or osteomyelitis. 2. Posterior paraspinal muscle edema bilaterally with mild enhancement on postcontrast imaging. No intramuscular fluid collection or hematoma. Differential considerations include muscle strain versus mild myositis. Electronically Signed   By: Elige Ko   On: 02/26/2018 09:53   Dg Chest Port 1 View  Result Date: 03/01/2018 CLINICAL DATA:  Shortness of breath EXAM: PORTABLE CHEST 1 VIEW COMPARISON:  02/24/2018 FINDINGS: Cardiomegaly. Moderate to large bilateral pleural effusions, stable on the left, increasing on the right since prior study. Diffuse bilateral airspace disease has worsened since prior study and slightly more pronounced on the right. This could reflect asymmetric edema or pneumonia. IMPRESSION: Moderate to large bilateral pleural effusions, increasing on the right since prior study. Diffuse bilateral airspace disease, right greater than left, worsening since prior study. This could reflect asymmetric edema or pneumonia. Electronically Signed   By: Charlett Nose M.D.   On: 03/01/2018 18:14   Dg Chest Port 1 View  Result Date: 02/20/2018  CLINICAL DATA:  Sepsis, endocarditis, cavitary pneumonia with septic emboli and fever. EXAM: PORTABLE CHEST 1 VIEW COMPARISON:  02/08/2018 FINDINGS: Multiple cavitary lesions again identified in both lungs with associated airspace disease in a pattern likely representing septic emboli with multiple areas of cavitary infection. There is a new left lateral pleural effusion which appears likely loculated and may represent an empyema. Further evaluation with CT of the chest with contrast  may be helpful. The heart size is stable. IMPRESSION: Persistent appearance of bilateral cavitary pneumonia likely representing septic emboli. There is a new loculated left lateral pleural effusion that may represent a developing empyema. Further evaluation with CT of the chest with contrast may be helpful. Electronically Signed   By: Irish Lack M.D.   On: 02/20/2018 19:11   [place] Ekg Site Rite  Result Date: 02/26/2018 If Site Rite image not attached, placement could not be confirmed due to current cardiac rhythm.   Time Spent in minutes 25   Erick Blinks M.D on 03/20/2018 at 7:38 PM  Between 7am to 7pm   After 7pm go to [email or web address]   [facility] -  Office  [phone]

## 2018-03-21 DIAGNOSIS — R600 Localized edema: Secondary | ICD-10-CM

## 2018-03-21 LAB — GLUCOSE, CAPILLARY
Glucose-Capillary: 114 mg/dL — ABNORMAL HIGH (ref 70–99)
Glucose-Capillary: 126 mg/dL — ABNORMAL HIGH (ref 70–99)

## 2018-03-21 MED ORDER — MAGNESIUM SULFATE 4 GM/100ML IV SOLN
4.0000 g | Freq: Once | INTRAVENOUS | Status: DC
  Filled 2018-03-21: qty 100

## 2018-03-21 MED ORDER — ALBUMIN HUMAN 25 % IV SOLN
25.0000 g | Freq: Two times a day (BID) | INTRAVENOUS | Status: AC
  Administered 2018-03-21 – 2018-03-22 (×2): 25 g via INTRAVENOUS
  Filled 2018-03-21 (×2): qty 50

## 2018-03-21 NOTE — Progress Notes (Signed)
Patient refused blood sugars for 2200 and 0300.

## 2018-03-21 NOTE — Progress Notes (Addendum)
PROGRESS NOTE                                                                                                                                                                                                             Patient Demographics:    Amy Mcknight, is a 23 y.o. female, DOB - 17-Mar-1995, OZD:[id number]  Admit date - 02/08/2018   Admitting Physician Charlsie Quest, MD  Outpatient Primary MD for the patient is Patient, No Pcp Per  LOS - 41  Outpatient Specialists: None  No chief complaint on file.      Brief Narrative   23 year old female with history of substance use disorder presented to the ED with 2-day history of right lower back pain of acute onset with associated diaphoresis, chills, shortness of breath, productive cough and right chest wall pain.  She has active heroin use last use 1 month prior to admission, regular recreational drug use (snorting nasally), reports history of opiate withdrawal.  Urine drug screen was positive for marijuana and opiates. 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  Requiring hospital stay for 6 weeks of antibiotic therapy. Hospital course complicated with development of acute diastolic CHF and treatment noncompliance.      Subjective:   Feels that Toradol is improving back pain.  No shortness of breath.  Will try and ambulate later today   Assessment  & Plan :    Principal Problem:   MSSA bacteremia Initial presentation was sepsis with septic shock.  Has tricuspid endocarditis, paravertebral myositis and cavitary pneumonia. Sepsis has resolved.  Case was discussed with ID who recommended inpatient IV antibiotic with Ancef for total 6 weeks duration until 3/23. Repeat blood cultures have been negative.  Active Problems: Acute diastolic CHF. Gained 14 kg since admission.   Attempting to diurese with Lasix but intermittently refusing meds, getting I's and O's and regular vitals.     Echo shows severe TR with LVEF of 60-65%. Counseled again on importance of monitoring her weight, I's and O's and taking her meds.. Continue p.o. Lasix 40 mg twice daily (refusing IV). Cardiology consult appreciated.  Severe sepsis and cavitary pneumonia. Metastatic from MSSA bacteremia/endocarditis.  Hemodynamically stable.  Continue Ancef.  Leukocytosis Improving a.m. lab.  Last chest x-ray on 3/8 with persistent effusion.  Remains afebrile.  Hypokalemia Replenished.  Acute kidney injury Secondary to sepsis.  Serum creatinine peaked at 2.61.  Now resolved.  Positive hepatitis C antibody Quantitative viral load undetectable.  Polysubstance abuse with?  Opiate withdrawal. Avoiding all narcotics.  Was started on Suboxone on 3/11.  Generalized weakness and physical debility  Intermittent PT.  Patient refusing getting out of bed.  Anemia of chronic disease Stable.  Received 2 unit PRBC on 2/12.  No intervention per GI.  Continue PPI.  Received Feraheme this admission.  Bilateral lower extremity edema Possibly due to volume overload.  Patient had refused lower extremity Dopplers, but is now agreeable to have this performed.  May also be related to hypoalbuminemia.  Continue Lasix  Anxiety and depression Continue Cymbalta.  Acute respiratory failure with hypoxia.  Patient becomes hypoxic when oxygen is removed. Patient often removes nasal cannula and is resistant to when she is asked to reapply this. Hypoxia likely related to pleural effusions noted prior chest xray.  Acute encephalopathy Patient noted to be lethargic this morning. Possibly related to hypoxia as well as medications. Robaxin discontinued and decreased gabapentin dosing. Mental status is improved     Code Status : Full code  Family Communication  : None at bedside  Disposition Plan  : Home after  completion of IV antibiotic on 3/23  Barriers For Discharge : Inpatient IV antibiotic  Consults  : GI, cardiology  Procedures  : 2D echo, MRI lumbar spine,  DVT Prophylaxis  : SCDs  Lab Results  Component Value Date   PLT 249 03/20/2018    Antibiotics  :   Anti-infectives (From admission, onward)   Start     Dose/Rate Route Frequency Ordered Stop   02/09/18 1500  vancomycin (VANCOCIN) IVPB 750 mg/150 ml premix  Status:  Discontinued     750 mg 150 mL/hr over 60 Minutes Intravenous Every 24 hours 02/08/18 1553 02/09/18 1201   02/09/18 1400  ceFAZolin (ANCEF) IVPB 2g/100 mL premix     2 g 200 mL/hr over 30 Minutes Intravenous Every 8 hours 02/09/18 1220 03/26/18 2359   02/09/18 1300  ceFAZolin (ANCEF) 2 g in dextrose 5 % 100 mL IVPB  Status:  Discontinued     2 g 200 mL/hr over 30 Minutes Intravenous Every 8 hours 02/09/18 1201 02/09/18 1220   02/09/18 0000  meropenem (MERREM) 1 g in sodium chloride 0.9 % 100 mL IVPB  Status:  Discontinued     1 g 200 mL/hr over 30 Minutes Intravenous Every 12 hours 02/08/18 1459 02/09/18 0853   02/08/18 1445  vancomycin (VANCOCIN) IVPB 1000 mg/200 mL premix     1,000 mg 200 mL/hr over 60 Minutes Intravenous  Once 02/08/18 1442 02/08/18 1555   02/08/18 1415  meropenem (MERREM) 1 g in sodium chloride 0.9 % 100 mL IVPB     1 g 200 mL/hr over 30 Minutes Intravenous  Once 02/08/18 1402 02/08/18 1455        Objective:   Vitals:   03/20/18 1553 03/20/18 2126 03/21/18 0520 03/21/18 1040  BP: 101/70 (!) 116/91 109/81 102/76  Pulse: 96 (!) 118 (!) 109 96  Resp: Temp: (!) 97.4 F (36.3 C) 99 F (37.2 C) 98.3 F (36.8 C)   TempSrc: Oral Oral Oral   SpO2: 96% 91% 98%   Weight:      Height:        Wt Readings from Last 3 Encounters:  03/19/18 71.9 kg  Intake/Output Summary (Last 24 hours) at 03/21/2018 1755 Last data filed at 03/21/2018 0300 Gross per 24 hour  Intake 1069 ml  Output -  Net 1069 ml    Physical exam  General exam: Alert, awake, oriented x 3 Respiratory system: Clear to auscultation. Respiratory effort normal. Cardiovascular system:RRR. No murmurs, rubs, gallops. Gastrointestinal system: Abdomen is nondistended, soft and nontender. No organomegaly or masses felt. Normal bowel sounds heard. Central nervous system: Alert and oriented. No focal neurological deficits. Extremities: 1+ edema bilaterally Skin: No rashes, lesions or ulcers Psychiatry: Judgement and insight appear normal. Mood & affect appropriate.         Data Review:    CBC Recent Labs  Lab 03/15/18 0513 03/16/18 0452 03/17/18 0645 03/20/18 0507  WBC 25.3* 16.9* 12.0* 9.8  HGB 9.8* 9.7* 9.1* 10.0*  HCT 34.4* 33.9* 31.6* 35.5*  PLT 339 336 278 249  MCV 95.6 94.7 93.5 96.2  MCH 27.2 27.1 26.9 27.1  MCHC 28.5* 28.6* 28.8* 28.2*  RDW 19.8* 20.1* 20.5* 20.9*  LYMPHSABS 2.7 5.3* 4.0  --   MONOABS 1.4* 1.2* 0.8  --   EOSABS 0.0 0.1 0.1  --   BASOSABS 0.1 0.1 0.1  --     Chemistries  Recent Labs  Lab 03/15/18 0513 03/16/18 0452 03/17/18 0645 03/20/18 0507  NA 133* 134* 135 136  K 5.4* 5.2* 3.3* 3.9  CL 96* 96* 96* 97*  CO2 19* GLUCOSE 35* 79 115* 98  BUN 14 25* 23* 19  CREATININE 0.87 0.87 0.60 0.59  CALCIUM 8.9 8.6* 8.1* 8.3*  MG  --   --  1.4* 1.6*  AST  --  90*  --  98*  ALT  --  18  --  17  ALKPHOS  --  157*  --  123  BILITOT  --  0.5  --  0.4   ------------------------------------------------------------------------------------------------------------------ No results for input(s): CHOL, HDL, LDLCALC, TRIG, CHOLHDL, LDLDIRECT in the last 72 hours.  No results found for: HGBA1C ------------------------------------------------------------------------------------------------------------------ No results for input(s): TSH, T4TOTAL, T3FREE, THYROIDAB in the last 72 hours.  Invalid input(s): FREET3  ------------------------------------------------------------------------------------------------------------------ No results for input(s): VITAMINB12, FOLATE, FERRITIN, TIBC, IRON, RETICCTPCT in the last 72 hours.  Coagulation profile No results for input(s): INR, PROTIME in the last 168 hours.  No results for input(s): DDIMER in the last 72 hours.  Cardiac Enzymes No results for input(s): CKMB, TROPONINI, MYOGLOBIN in the last 168 hours.  Invalid input(s): CK ------------------------------------------------------------------------------------------------------------------    Component Value Date/Time   BNP 614.0 (H) 03/03/2018 0716    Inpatient Medications  Scheduled Meds: . buprenorphine-naloxone  1 tablet Sublingual BID  . DULoxetine  30 mg Oral BID  . feeding supplement  1 Container Oral BID BM  . ferrous sulfate  325 mg Oral Q breakfast  . furosemide  40 mg Oral BID  . gabapentin  100 mg Oral TID  . magnesium oxide  400 mg Oral BID  . metoprolol tartrate  75 mg Oral BID  . multivitamin with minerals  1 tablet Oral Daily  . pantoprazole  40 mg Oral BID  . potassium chloride  40 mEq Oral Once  . senna-docusate  2 tablet Oral BID  . sodium chloride flush  10-40 mL Intracatheter Q12H   Continuous Infusions: . sodium chloride Stopped (03/19/18 1641)  . albumin human    .  ceFAZolin (ANCEF) IV 2 g (03/21/18 1411)  . magnesium sulfate 1 - 4 g bolus IVPB  PRN Meds:.sodium chloride, acetaminophen **OR** acetaminophen, albuterol, guaiFENesin-dextromethorphan, ipratropium-albuterol, ketorolac, ondansetron **OR** ondansetron (ZOFRAN) IV, pentafluoroprop-tetrafluoroeth, prochlorperazine, sodium chloride flush, traZODone  Micro Results No results found for this or any previous visit (from the past 240 hour(s)).  Radiology Reports Dg Chest 2 View  Result Date: 03/11/2018 CLINICAL DATA:  Bacteremia.  Drug abuse. EXAM: CHEST - 2 VIEW COMPARISON:  March 01, 2018 FINDINGS:  The right PICC line terminates in the SVC, unchanged. Bilateral patchy pulmonary infiltrates persist with moderate bilateral pleural effusions, stable. The cardiomediastinal silhouette is unchanged. IMPRESSION: Persistent bilateral pleural effusions and patchy bilateral pulmonary infiltrates. Electronically Signed   By: Gerome Sam III M.D   On: 03/11/2018 15:54   Dg Chest 2 View  Result Date: 02/24/2018 CLINICAL DATA:  Short of breath EXAM: CHEST - 2 VIEW COMPARISON:  02/20/2018 FINDINGS: Upper normal heart size. Stable left pleural effusion. Tiny right pleural effusion is stable. Patchy airspace opacities throughout both lungs are not significantly changed. No pneumothorax. IMPRESSION: Bilateral airspace disease and bilateral pleural effusions left greater than right are stable. Electronically Signed   By: Jolaine Click M.D.   On: 02/24/2018 15:50   Mr Lumbar Spine W Wo Contrast  Result Date: 02/26/2018 CLINICAL DATA:  Low back pain since prior abnormal MRI 02/08/2018. Unable to walk. EXAM: MRI LUMBAR SPINE WITHOUT AND WITH CONTRAST TECHNIQUE: Multiplanar and multiecho pulse sequences of the lumbar spine were obtained without and with intravenous contrast. CONTRAST:  7 mL Gadavist COMPARISON:  02/08/2018 FINDINGS: Segmentation:  Standard. Alignment:  Physiologic. Vertebrae:  No fracture, evidence of discitis, or bone lesion. Conus medullaris and cauda equina: Conus extends to the L1 level. Conus and cauda equina appear normal. Paraspinal and other soft tissues: Posterior paraspinal muscle edema bilaterally with mild enhancement on postcontrast imaging. No intramuscular fluid collection or hematoma. Disc levels: Disc spaces: Disc spaces are maintained. T12-L1: No significant disc bulge. No evidence of neural foraminal stenosis. No central canal stenosis. L1-L2: No significant disc bulge. No evidence of neural foraminal stenosis. No central canal stenosis. L2-L3: No significant disc bulge. No evidence of  neural foraminal stenosis. No central canal stenosis. L3-L4: No significant disc bulge. No evidence of neural foraminal stenosis. No central canal stenosis. L4-L5: Minimal broad-based disc bulge. No evidence of neural foraminal stenosis. No central canal stenosis. L5-S1: Minimal broad-based disc bulge. No evidence of neural foraminal stenosis. No central canal stenosis. IMPRESSION: 1. No epidural fluid collection.  No discitis or osteomyelitis. 2. Posterior paraspinal muscle edema bilaterally with mild enhancement on postcontrast imaging. No intramuscular fluid collection or hematoma. Differential considerations include muscle strain versus mild myositis. Electronically Signed   By: Elige Ko   On: 02/26/2018 09:53   Dg Chest Port 1 View  Result Date: 03/01/2018 CLINICAL DATA:  Shortness of breath EXAM: PORTABLE CHEST 1 VIEW COMPARISON:  02/24/2018 FINDINGS: Cardiomegaly. Moderate to large bilateral pleural effusions, stable on the left, increasing on the right since prior study. Diffuse bilateral airspace disease has worsened since prior study and slightly more pronounced on the right. This could reflect asymmetric edema or pneumonia. IMPRESSION: Moderate to large bilateral pleural effusions, increasing on the right since prior study. Diffuse bilateral airspace disease, right greater than left, worsening since prior study. This could reflect asymmetric edema or pneumonia. Electronically Signed   By: Charlett Nose M.D.   On: 03/01/2018 18:14   Dg Chest Port 1 View  Result Date: 02/20/2018 CLINICAL DATA:  Sepsis, endocarditis, cavitary pneumonia with septic emboli and fever. EXAM: PORTABLE  CHEST 1 VIEW COMPARISON:  02/08/2018 FINDINGS: Multiple cavitary lesions again identified in both lungs with associated airspace disease in a pattern likely representing septic emboli with multiple areas of cavitary infection. There is a new left lateral pleural effusion which appears likely loculated and may represent an  empyema. Further evaluation with CT of the chest with contrast may be helpful. The heart size is stable. IMPRESSION: Persistent appearance of bilateral cavitary pneumonia likely representing septic emboli. There is a new loculated left lateral pleural effusion that may represent a developing empyema. Further evaluation with CT of the chest with contrast may be helpful. Electronically Signed   By: Irish Lack M.D.   On: 02/20/2018 19:11   [place] Ekg Site Rite  Result Date: 02/26/2018 If Site Rite image not attached, placement could not be confirmed due to current cardiac rhythm.   Time Spent in minutes 25   Erick Blinks M.D on 03/21/2018 at 5:55 PM  Between 7am to 7pm   After 7pm go to [email or web address]   [facility] -  Office  [phone]

## 2018-03-21 NOTE — Progress Notes (Signed)
Patient refused HS cbg. Pt educated.

## 2018-03-22 ENCOUNTER — Inpatient Hospital Stay (HOSPITAL_COMMUNITY): Payer: Self-pay

## 2018-03-22 LAB — GLUCOSE, CAPILLARY
Glucose-Capillary: 143 mg/dL — ABNORMAL HIGH (ref 70–99)
Glucose-Capillary: 131 mg/dL — ABNORMAL HIGH (ref 70–99)
Glucose-Capillary: 96 mg/dL (ref 70–99)

## 2018-03-22 NOTE — Progress Notes (Signed)
PT Cancellation Note  Patient Details Name: Amy Mcknight MRN: [id number] DOB: Jul 04, 1995   Cancelled Treatment:    Reason Eval/Treat Not Completed: Patient declined, no reason specified.  Patient continues to refuse out of bed activity - RN present and aware.    2:29 PM, 03/22/18 Ocie Bob, MPT Physical Therapist with [facility] [phone] office [phone] mobile phone

## 2018-03-22 NOTE — Plan of Care (Signed)
Notified Dr. Kerry Hough patient refused ultrasound of lower extremities.

## 2018-03-22 NOTE — Plan of Care (Signed)
Patient continues to pick and choose when she wants to be compliant with plan of care. Discussed with patient that she has not had a bowel movement documented since 03/16/2018 and that MD was giving sennokot to help  with regular bowel function. Patient still refuses medication. Patient did allow her a.m. blood sugar to be taken, but refuses her lunch time blood sugar. She did eat about 50% of her breakfast. She is refusing to get up. Patient has not complained of any pain to nurse and is currently asleep in her bed.

## 2018-03-22 NOTE — Progress Notes (Signed)
Nutrition Follow-up  DOCUMENTATION CODES:   Severe malnutrition in context of acute illness/injury  INTERVENTION:  CIB daily to provide 130 kcals and 5 grams protein per serving (serve with 1 cup whole milk for additional 108 kcals and 8.2 grams protein)  Magic cup TID with meals, each supplement provides 290 kcal and 9 grams of protein (vanilla)   NUTRITION DIAGNOSIS:   Severe Malnutrition related to acute illness(MSSA bacteremia, Pneumonia, endocarditis.) as evidenced by energy intake < or equal to 50% for > or equal to 5 days, edema : Ongoing-  GOAL:   Patient will meet greater than or equal to 90% of their needs  Progressing- with intake best at breakfast  MONITOR:   PO intake, Weight trends, Labs, I & O's   ASSESSMENT: RD initially drawn to pt due to calorie count consult. Patient is a 23 yo female with a history of substance abuse, severe anemia (received 2 units PRBC's), MSSA bacteremia, Pneumonia, endocarditis. Bi-lateral pedal edema. Severe weight gain since admission-17% increase in wt or gain of 9.9 kg.    Supplements: none- patient refuses offer of any supplements or milk with meals- replies, "I don't have a taste for anything." Explained to patient that her body requires nutrition whether she "feels like" eating or not. RD unable to identify any food or beverage that pt would commit to eat or drink.  F/U- 2/24 patient consumed 100% of breakfast this morning and on RD arrival she is waiting on nutrition services to bring a snack of cereal. Patient says she is eating better and her demeanor is better today. Lower extremity edema looks better and she is wearing her ted hose. Labs reviewed. Current weight 68 kg which is a gain of 13.6 kg (20%).   F/U-2/28 patient ate 75% of breakfast this morning. Her weight continues to increase - an additional 5 kg since 2/24 and a total of 18 kg since admission. MD notified. No changes to nutrition plan at this point.  F/U- 3/6  patient consuming 25-50% meals per recent chart documentation and pt report. Talked with nutrition services staff who affirm intake "comes and goes". Today patient is starting to eat breakfast cold cereal, eggs bacon and biscuit. Talked with her again about adding an oral nutrition supplement and she was accepting of the idea today. She prefers juice flavors and wants to trial [employer]. Will add to her nutrition regimen between meals. Previous severe wt gain noted- she has desirable loss from 73.4 down to 71.8 kg since last follow-up.  F/U- 3/12 patient reports 100% of breakfast and stated that she is always hungry in the morning. Large fruit tray at bedside still wrapped that was served for lunch. Patient reports that she loves fruit, but she is feeling nauseas and still full from breakfast. RD asked if she had tried any of the ONS provided and she stated that she did not like any of them. RD encouraged patient to take small bites of food, chew well, and remain sitting upright for 40-45 minutes after breakfast to assist with proper digestion, patient agreed to try. Patient agreed to having vanilla MC w/ meals and vanilla CIB. Weight Improvement noted today from recent downward trends since previous follow-up: 3/5 (71.8kg) 3/7 (71.4kg) 3/8 (70.2 kg) 3/9 (69.6kg) 3/10 (69.6kg), 3/11 (70.9kg)  F/U 3/19 -patient is sleeping soundly and lunch tray is here untouched. Talked with nutrition services ambassador who says she usually eats the best for breakfast. Tray was not pick-up by staff PBJ and fruit  for her to eat later when she wakes. Talked with her nurse who reports refusing oral supplements. Significant bilateral edema to lower extremities- pitting. Patient has acute CHF but has been refusing diuretic at times and refused dopplers for lower legs. Weight is 72.1 kg which has remained in a stable range as noted above- the past 13 days. She is on a fluid restriction of 2 L as well.  Intake/Output Summary  (Last 24 hours) at 03/22/2018 1327 Last data filed at 03/22/2018 0827 Gross per 24 hour  Intake 720 ml  Output -  Net 720 ml     Diet Order:   Diet Order            Diet Heart Room service appropriate? Yes; Fluid consistency: Thin; Fluid restriction: 2000 mL Fluid  Diet effective now              EDUCATION NEEDS:   Not appropriate for education at this time  Skin:  Skin Assessment: Reviewed RN Assessment  Last BM:  3/13  Height:   Ht Readings from Last 1 Encounters:  03/04/18 5\' 3"  (1.6 m)    Weight:   Wt Readings from Last 1 Encounters:  03/22/18 72.1 kg    Ideal Body Weight:  52 kg  BMI:  Body mass index is 28.16 kg/m.  Estimated Nutritional Needs:   Kcal:  1650-1815 (30-33 kcal/kg/bw)(Based on admission wt of 55 kg. Severe wt gain (20 lb) since admission.)  Protein:  88-99 (1.6-1.8 gr/kg/bw)  Fluid:  per MD goals  Royann Shivers MS,RD,CSG,LDN Office: [phone] Pager: [phone]

## 2018-03-22 NOTE — Progress Notes (Signed)
PROGRESS NOTE                                                                                                                                                                                                             Patient Demographics:    Amy Mcknight, is a 23 y.o. female, DOB - 17-Mar-1995, XLK:[id number]  Admit date - 02/08/2018   Admitting Physician Charlsie Quest, MD  Outpatient Primary MD for the patient is Patient, No Pcp Per  LOS - 42  Outpatient Specialists: None  No chief complaint on file.      Brief Narrative   23 year old female with history of substance use disorder presented to the ED with 2-day history of right lower back pain of acute onset with associated diaphoresis, chills, shortness of breath, productive cough and right chest wall pain.  She has active heroin use last use 1 month prior to admission, regular recreational drug use (snorting nasally), reports history of opiate withdrawal.  Urine drug screen was positive for marijuana and opiates. 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  Requiring hospital stay for 6 weeks of antibiotic therapy. Hospital course complicated with development of acute diastolic CHF and treatment noncompliance.      Subjective:   Patient does not engage in conversation.  Does not want to be bothered.   Assessment  & Plan :    Principal Problem:   MSSA bacteremia Initial presentation was sepsis with septic shock.  Has tricuspid endocarditis, paravertebral myositis and cavitary pneumonia. Sepsis has resolved.  Case was discussed with ID who recommended inpatient IV antibiotic with Ancef for total 6 weeks duration until 3/23. Repeat blood cultures have been negative.  Active Problems: Acute diastolic CHF. Gained 14 kg since admission.  Attempting to diurese with Lasix but  intermittently refusing meds, getting I's and O's and regular vitals.     Echo shows severe TR with LVEF of 60-65%. Counseled again on importance of monitoring her weight, I's and O's and taking her meds.. Continue p.o. Lasix 40 mg twice daily (refusing IV). Cardiology consult appreciated.  Severe sepsis and cavitary pneumonia. Metastatic from MSSA bacteremia/endocarditis.  Hemodynamically stable.  Continue Ancef.  Leukocytosis Improving a.m. lab.  Last chest x-ray on 3/8 with persistent effusion.  Remains afebrile.  Hypokalemia Replenished.  Acute kidney injury Secondary to sepsis.  Serum creatinine peaked at 2.61.  Now resolved.  Positive hepatitis C antibody Quantitative viral load undetectable.  Polysubstance abuse with?  Opiate withdrawal. Avoiding all narcotics.  Was started on Suboxone on 3/11.  Generalized weakness and physical debility  Intermittent PT.  Patient refusing getting out of bed.  Anemia of chronic disease Stable.  Received 2 unit PRBC on 2/12.  No intervention per GI.  Continue PPI.  Received Feraheme this admission.  Bilateral lower extremity edema Possibly due to volume overload.  Patient has refused lower extremity Dopplers on more than one occasion.  Continue Lasix  Anxiety and depression Continue Cymbalta.  Acute respiratory failure with hypoxia.  Patient becomes hypoxic when oxygen is removed. Patient often removes nasal cannula and is resistant to when she is asked to reapply this. Hypoxia likely related to pleural effusions noted prior chest xray.  Acute encephalopathy Patient noted to be lethargic this morning. Possibly related to hypoxia as well as medications. Robaxin discontinued and decreased gabapentin dosing. Mental status is now improved     Code Status : Full code  Family Communication  : None at bedside  Disposition Plan  : Home after completion of IV antibiotic on 3/23  Barriers For Discharge : Inpatient IV  antibiotic  Consults  : GI, cardiology  Procedures  : 2D echo, MRI lumbar spine,  DVT Prophylaxis  : SCDs  Lab Results  Component Value Date   PLT 249 03/20/2018    Antibiotics  :   Anti-infectives (From admission, onward)   Start     Dose/Rate Route Frequency Ordered Stop   02/09/18 1500  vancomycin (VANCOCIN) IVPB 750 mg/150 ml premix  Status:  Discontinued     750 mg 150 mL/hr over 60 Minutes Intravenous Every 24 hours 02/08/18 1553 02/09/18 1201   02/09/18 1400  ceFAZolin (ANCEF) IVPB 2g/100 mL premix     2 g 200 mL/hr over 30 Minutes Intravenous Every 8 hours 02/09/18 1220 03/26/18 2359   02/09/18 1300  ceFAZolin (ANCEF) 2 g in dextrose 5 % 100 mL IVPB  Status:  Discontinued     2 g 200 mL/hr over 30 Minutes Intravenous Every 8 hours 02/09/18 1201 02/09/18 1220   02/09/18 0000  meropenem (MERREM) 1 g in sodium chloride 0.9 % 100 mL IVPB  Status:  Discontinued     1 g 200 mL/hr over 30 Minutes Intravenous Every 12 hours 02/08/18 1459 02/09/18 0853   02/08/18 1445  vancomycin (VANCOCIN) IVPB 1000 mg/200 mL premix     1,000 mg 200 mL/hr over 60 Minutes Intravenous  Once 02/08/18 1442 02/08/18 1555   02/08/18 1415  meropenem (MERREM) 1 g in sodium chloride 0.9 % 100 mL IVPB     1 g 200 mL/hr over 30 Minutes Intravenous  Once 02/08/18 1402 02/08/18 1455        Objective:   Vitals:   03/21/18 1040 03/21/18 2008 03/21/18 2041 03/22/18 0518  BP: 102/76  111/86 112/75  Pulse: 96  (!) 107 (!) 101  Resp:   18 16  Temp:   98.5 F (36.9 C) 97.8 F (36.6 C)  TempSrc:   Oral   SpO2:  (!) 79% 97% (!) 83%  Weight:    72.1 kg  Height:        Wt Readings from Last 3 Encounters:  03/22/18 72.1 kg     Intake/Output Summary (Last 24 hours) at 03/22/2018 1911 Last data filed at 03/22/2018 1500 Gross  per 24 hour  Intake 720 ml  Output --  Net 720 ml    Physical exam General exam: Alert, awake, oriented x 3 Respiratory system: Clear to auscultation. Respiratory effort  normal. Cardiovascular system:RRR. No murmurs, rubs, gallops. Gastrointestinal system: Abdomen is nondistended, soft and nontender. No organomegaly or masses felt. Normal bowel sounds heard. Central nervous system: Alert and oriented. No focal neurological deficits. Extremities: 1+ edema bilaterally Skin: No rashes, lesions or ulcers Psychiatry: Judgement and insight appear normal. Mood & affect appropriate.     Data Review:    CBC Recent Labs  Lab 03/16/18 0452 03/17/18 0645 03/20/18 0507  WBC 16.9* 12.0* 9.8  HGB 9.7* 9.1* 10.0*  HCT 33.9* 31.6* 35.5*  PLT 336 278 249  MCV 94.7 93.5 96.2  MCH 27.1 26.9 27.1  MCHC 28.6* 28.8* 28.2*  RDW 20.1* 20.5* 20.9*  LYMPHSABS 5.3* 4.0  --   MONOABS 1.2* 0.8  --   EOSABS 0.1 0.1  --   BASOSABS 0.1 0.1  --     Chemistries  Recent Labs  Lab 03/16/18 0452 03/17/18 0645 03/20/18 0507  NA 134* 135 136  K 5.2* 3.3* 3.9  CL 96* 96* 97*  CO2 26 29 30   GLUCOSE 79 115* 98  BUN 25* 23* 19  CREATININE 0.87 0.60 0.59  CALCIUM 8.6* 8.1* 8.3*  MG  --  1.4* 1.6*  AST 90*  --  98*  ALT 18  --  17  ALKPHOS 157*  --  123  BILITOT 0.5  --  0.4   ------------------------------------------------------------------------------------------------------------------ No results for input(s): CHOL, HDL, LDLCALC, TRIG, CHOLHDL, LDLDIRECT in the last 72 hours.  No results found for: HGBA1C ------------------------------------------------------------------------------------------------------------------ No results for input(s): TSH, T4TOTAL, T3FREE, THYROIDAB in the last 72 hours.  Invalid input(s): FREET3 ------------------------------------------------------------------------------------------------------------------ No results for input(s): VITAMINB12, FOLATE, FERRITIN, TIBC, IRON, RETICCTPCT in the last 72 hours.  Coagulation profile No results for input(s): INR, PROTIME in the last 168 hours.  No results for input(s): DDIMER in the last 72  hours.  Cardiac Enzymes No results for input(s): CKMB, TROPONINI, MYOGLOBIN in the last 168 hours.  Invalid input(s): CK ------------------------------------------------------------------------------------------------------------------    Component Value Date/Time   BNP 614.0 (H) 03/03/2018 0716    Inpatient Medications  Scheduled Meds:  buprenorphine-naloxone  1 tablet Sublingual BID   DULoxetine  30 mg Oral BID   feeding supplement  1 Container Oral BID BM   ferrous sulfate  325 mg Oral Q breakfast   furosemide  40 mg Oral BID   gabapentin  100 mg Oral TID   magnesium oxide  400 mg Oral BID   metoprolol tartrate  75 mg Oral BID   multivitamin with minerals  1 tablet Oral Daily   pantoprazole  40 mg Oral BID   potassium chloride  40 mEq Oral Once   senna-docusate  2 tablet Oral BID   sodium chloride flush  10-40 mL Intracatheter Q12H   Continuous Infusions:  sodium chloride Stopped (03/19/18 1641)    ceFAZolin (ANCEF) IV 2 g (03/22/18 1416)   magnesium sulfate 1 - 4 g bolus IVPB     PRN Meds:.sodium chloride, acetaminophen **OR** acetaminophen, albuterol, guaiFENesin-dextromethorphan, ipratropium-albuterol, ketorolac, ondansetron **OR** ondansetron (ZOFRAN) IV, pentafluoroprop-tetrafluoroeth, prochlorperazine, sodium chloride flush, traZODone  Micro Results No results found for this or any previous visit (from the past 240 hour(s)).  Radiology Reports Dg Chest 2 View  Result Date: 03/11/2018 CLINICAL DATA:  Bacteremia.  Drug abuse. EXAM: CHEST - 2 VIEW  COMPARISON:  March 01, 2018 FINDINGS: The right PICC line terminates in the SVC, unchanged. Bilateral patchy pulmonary infiltrates persist with moderate bilateral pleural effusions, stable. The cardiomediastinal silhouette is unchanged. IMPRESSION: Persistent bilateral pleural effusions and patchy bilateral pulmonary infiltrates. Electronically Signed   By: Gerome Samavid  Williams III M.D   On: 03/11/2018 15:54    Dg Chest 2 View  Result Date: 02/24/2018 CLINICAL DATA:  Short of breath EXAM: CHEST - 2 VIEW COMPARISON:  02/20/2018 FINDINGS: Upper normal heart size. Stable left pleural effusion. Tiny right pleural effusion is stable. Patchy airspace opacities throughout both lungs are not significantly changed. No pneumothorax. IMPRESSION: Bilateral airspace disease and bilateral pleural effusions left greater than right are stable. Electronically Signed   By: Jolaine ClickArthur  Hoss M.D.   On: 02/24/2018 15:50   Mr Lumbar Spine W Wo Contrast  Result Date: 02/26/2018 CLINICAL DATA:  Low back pain since prior abnormal MRI 02/08/2018. Unable to walk. EXAM: MRI LUMBAR SPINE WITHOUT AND WITH CONTRAST TECHNIQUE: Multiplanar and multiecho pulse sequences of the lumbar spine were obtained without and with intravenous contrast. CONTRAST:  7 mL Gadavist COMPARISON:  02/08/2018 FINDINGS: Segmentation:  Standard. Alignment:  Physiologic. Vertebrae:  No fracture, evidence of discitis, or bone lesion. Conus medullaris and cauda equina: Conus extends to the L1 level. Conus and cauda equina appear normal. Paraspinal and other soft tissues: Posterior paraspinal muscle edema bilaterally with mild enhancement on postcontrast imaging. No intramuscular fluid collection or hematoma. Disc levels: Disc spaces: Disc spaces are maintained. T12-L1: No significant disc bulge. No evidence of neural foraminal stenosis. No central canal stenosis. L1-L2: No significant disc bulge. No evidence of neural foraminal stenosis. No central canal stenosis. L2-L3: No significant disc bulge. No evidence of neural foraminal stenosis. No central canal stenosis. L3-L4: No significant disc bulge. No evidence of neural foraminal stenosis. No central canal stenosis. L4-L5: Minimal broad-based disc bulge. No evidence of neural foraminal stenosis. No central canal stenosis. L5-S1: Minimal broad-based disc bulge. No evidence of neural foraminal stenosis. No central canal  stenosis. IMPRESSION: 1. No epidural fluid collection.  No discitis or osteomyelitis. 2. Posterior paraspinal muscle edema bilaterally with mild enhancement on postcontrast imaging. No intramuscular fluid collection or hematoma. Differential considerations include muscle strain versus mild myositis. Electronically Signed   By: Elige KoHetal  Patel   On: 02/26/2018 09:53   Dg Chest Port 1 View  Result Date: 03/01/2018 CLINICAL DATA:  Shortness of breath EXAM: PORTABLE CHEST 1 VIEW COMPARISON:  02/24/2018 FINDINGS: Cardiomegaly. Moderate to large bilateral pleural effusions, stable on the left, increasing on the right since prior study. Diffuse bilateral airspace disease has worsened since prior study and slightly more pronounced on the right. This could reflect asymmetric edema or pneumonia. IMPRESSION: Moderate to large bilateral pleural effusions, increasing on the right since prior study. Diffuse bilateral airspace disease, right greater than left, worsening since prior study. This could reflect asymmetric edema or pneumonia. Electronically Signed   By: Charlett NoseKevin  Dover M.D.   On: 03/01/2018 18:14   [place] Ekg Site Rite  Result Date: 02/26/2018 If Site Rite image not attached, placement could not be confirmed due to current cardiac rhythm.   Time Spent in minutes 25   Erick BlinksJehanzeb Shahid Flori M.D on 03/22/2018 at 7:11 PM  Between 7am to 7pm   After 7pm go to [email or web address]   [facility] -  Office  [phone]

## 2018-03-23 LAB — GLUCOSE, CAPILLARY
Glucose-Capillary: 85 mg/dL (ref 70–99)
Glucose-Capillary: 99 mg/dL (ref 70–99)
Glucose-Capillary: 132 mg/dL — ABNORMAL HIGH (ref 70–99)

## 2018-03-23 LAB — COMPREHENSIVE METABOLIC PANEL
ALBUMIN: 2.2 g/dL — AB (ref 3.5–5.0)
ALT: 7 U/L (ref 0–44)
ANION GAP: 10 (ref 5–15)
AST: 41 U/L (ref 15–41)
Alkaline Phosphatase: 126 U/L (ref 38–126)
BUN: 15 mg/dL (ref 6–20)
CO2: 28 mmol/L (ref 22–32)
Calcium: 8.2 mg/dL — ABNORMAL LOW (ref 8.9–10.3)
Chloride: 97 mmol/L — ABNORMAL LOW (ref 98–111)
Creatinine, Ser: 0.61 mg/dL (ref 0.44–1.00)
GFR calc Af Amer: >60 mL/min (ref >60)
GFR calc non Af Amer: >60 mL/min (ref >60)
GLUCOSE: 135 mg/dL — AB (ref 70–99)
POTASSIUM: 4.3 mmol/L (ref 3.5–5.1)
Sodium: 135 mmol/L (ref 135–145)
Total Bilirubin: 0.5 mg/dL (ref 0.3–1.2)
Total Protein: 7.1 g/dL (ref 6.5–8.1)

## 2018-03-23 LAB — CBC
HCT: 34.7 % — ABNORMAL LOW (ref 36.0–46.0)
Hemoglobin: 10.1 g/dL — ABNORMAL LOW (ref 12.0–15.0)
MCH: 27.9 pg (ref 26.0–34.0)
MCHC: 29.1 g/dL — ABNORMAL LOW (ref 30.0–36.0)
MCV: 95.9 fL (ref 80.0–100.0)
Platelets: 227 10*3/uL (ref 150–400)
RBC: 3.62 MIL/uL — ABNORMAL LOW (ref 3.87–5.11)
RDW: 20.3 % — ABNORMAL HIGH (ref 11.5–15.5)
WBC: 13.6 10*3/uL — ABNORMAL HIGH (ref 4.0–10.5)
nRBC: 0 % (ref 0.0–0.2)

## 2018-03-23 NOTE — Progress Notes (Signed)
PT Cancellation Note  Patient Details Name: Amy Mcknight MRN: [id number] DOB: 1995-10-18   Cancelled Treatment:    Reason Eval/Treat Not Completed: Patient declined, no reason specified Attempted PT session, pt decline with no reason given.  Pt strongly encouraged to participate though continued to decline.  Pt did state she will participate on Monday.    [address], LPTA; CBIS [phone] Juel Burrow 03/23/2018, 5:01 PM

## 2018-03-23 NOTE — Progress Notes (Signed)
PROGRESS NOTE                                                                                                                                                                                                             Patient Demographics:    Amy Mcknight, is a 23 y.o. female, DOB - 12-21-1995, WPY:[id number]  Admit date - 02/08/2018   Admitting Physician Charlsie Quest, MD  Outpatient Primary MD for the patient is Patient, No Pcp Per  LOS - 43  Outpatient Specialists: None  No chief complaint on file.      Brief Narrative   23 year old female with history of substance use disorder presented to the ED with 2-day history of right lower back pain of acute onset with associated diaphoresis, chills, shortness of breath, productive cough and right chest wall pain.  She has active heroin use last use 1 month prior to admission, regular recreational drug use (snorting nasally), reports history of opiate withdrawal.  Urine drug screen was positive for marijuana and opiates. 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  Requiring hospital stay for 6 weeks of antibiotic therapy. Hospital course complicated with development of acute diastolic CHF and treatment noncompliance.      Subjective:   Denies any pain. Does not want to ambulate at this time. Will consider ambulating later today   Assessment  & Plan :    Principal Problem:   MSSA bacteremia Initial presentation was sepsis with septic shock.  Has tricuspid endocarditis, paravertebral myositis and cavitary pneumonia. Sepsis has resolved.  Case was discussed with ID who recommended inpatient IV antibiotic with Ancef for total 6 weeks duration until 3/23. Repeat blood cultures have been negative.  Active Problems: Acute diastolic CHF. Gained 14 kg since admission.  Attempting to  diurese with Lasix but intermittently refusing meds, getting I's and O's and regular vitals.     Echo shows severe TR with LVEF of 60-65%. Counseled again on importance of monitoring her weight, I's and O's and taking her meds.. Continue p.o. Lasix 40 mg twice daily (refusing IV). Cardiology consult appreciated.  Severe sepsis and cavitary pneumonia. Metastatic from MSSA bacteremia/endocarditis.  Hemodynamically stable.  Continue Ancef.  Leukocytosis Improving a.m. lab.  Last chest x-ray on 3/8 with persistent effusion.  Remains afebrile.  Hypokalemia Replenished.  Acute kidney injury Secondary to sepsis.  Serum creatinine peaked at 2.61.  Now resolved.  Positive hepatitis C antibody Quantitative viral load undetectable.  Polysubstance abuse with?  Opiate withdrawal. Avoiding all narcotics.  Was started on Suboxone on 3/11.  Generalized weakness and physical debility  Intermittent PT.  Patient refusing getting out of bed.  Anemia of chronic disease Stable.  Received 2 unit PRBC on 2/12.  No intervention per GI.  Continue PPI.  Received Feraheme this admission.  Bilateral lower extremity edema Possibly due to volume overload.  Patient has refused lower extremity Dopplers on more than one occasion.  Continue Lasix  Anxiety and depression Continue Cymbalta.  Acute respiratory failure with hypoxia.  Patient becomes hypoxic when oxygen is removed. Patient often removes nasal cannula and is resistant to when she is asked to reapply this. Hypoxia likely related to pleural effusions noted prior chest xray.  Acute encephalopathy Patient noted to be lethargic this morning. Possibly related to hypoxia as well as medications. Robaxin discontinued and decreased gabapentin dosing. Mental status is now improved     Code Status : Full code  Family Communication  : None at bedside  Disposition Plan  : Home after completion of IV antibiotic on 3/23  Barriers For Discharge : Inpatient  IV antibiotic  Consults  : GI, cardiology  Procedures  : 2D echo, MRI lumbar spine,  DVT Prophylaxis  : SCDs  Lab Results  Component Value Date   PLT 227 03/23/2018    Antibiotics  :   Anti-infectives (From admission, onward)   Start     Dose/Rate Route Frequency Ordered Stop   02/09/18 1500  vancomycin (VANCOCIN) IVPB 750 mg/150 ml premix  Status:  Discontinued     750 mg 150 mL/hr over 60 Minutes Intravenous Every 24 hours 02/08/18 1553 02/09/18 1201   02/09/18 1400  ceFAZolin (ANCEF) IVPB 2g/100 mL premix     2 g 200 mL/hr over 30 Minutes Intravenous Every 8 hours 02/09/18 1220 03/26/18 2359   02/09/18 1300  ceFAZolin (ANCEF) 2 g in dextrose 5 % 100 mL IVPB  Status:  Discontinued     2 g 200 mL/hr over 30 Minutes Intravenous Every 8 hours 02/09/18 1201 02/09/18 1220   02/09/18 0000  meropenem (MERREM) 1 g in sodium chloride 0.9 % 100 mL IVPB  Status:  Discontinued     1 g 200 mL/hr over 30 Minutes Intravenous Every 12 hours 02/08/18 1459 02/09/18 0853   02/08/18 1445  vancomycin (VANCOCIN) IVPB 1000 mg/200 mL premix     1,000 mg 200 mL/hr over 60 Minutes Intravenous  Once 02/08/18 1442 02/08/18 1555   02/08/18 1415  meropenem (MERREM) 1 g in sodium chloride 0.9 % 100 mL IVPB     1 g 200 mL/hr over 30 Minutes Intravenous  Once 02/08/18 1402 02/08/18 1455        Objective:   Vitals:   03/22/18 2042 03/22/18 2152 03/23/18 0518 03/23/18 0629  BP: 110/87  113/82   Pulse: (!) 111  (!) 111   Resp: 18  20   Temp: 99.2 F (37.3 C)  98.9 F (37.2 C)   TempSrc: Oral  Oral   SpO2: (!) 88% 90% 97%   Weight:    75.6 kg  Height:        Wt Readings from Last 3 Encounters:  03/23/18 75.6 kg     Intake/Output Summary (Last 24 hours) at 03/23/2018 1732 Last data filed at 03/23/2018  1304 Gross per 24 hour  Intake 720 ml  Output --  Net 720 ml    Physical exam General exam: Alert, awake, oriented x 3 Respiratory system: Clear to auscultation. Respiratory effort  normal. Cardiovascular system:RRR. No murmurs, rubs, gallops. Gastrointestinal system: Abdomen is nondistended, soft and nontender. No organomegaly or masses felt. Normal bowel sounds heard. Central nervous system: Alert and oriented. No focal neurological deficits. Extremities: 1+ edema bilaterally Skin: No rashes, lesions or ulcers Psychiatry: Judgement and insight appear normal. Mood & affect appropriate.      Data Review:    CBC Recent Labs  Lab 03/17/18 0645 03/20/18 0507 03/23/18 0529  WBC 12.0* 9.8 13.6*  HGB 9.1* 10.0* 10.1*  HCT 31.6* 35.5* 34.7*  PLT 278 249 227  MCV 93.5 96.2 95.9  MCH 26.9 27.1 27.9  MCHC 28.8* 28.2* 29.1*  RDW 20.5* 20.9* 20.3*  LYMPHSABS 4.0  --   --   MONOABS 0.8  --   --   EOSABS 0.1  --   --   BASOSABS 0.1  --   --     Chemistries  Recent Labs  Lab 03/17/18 0645 03/20/18 0507 03/23/18 0529  NA 135 136 135  K 3.3* 3.9 4.3  CL 96* 97* 97*  CO2 GLUCOSE 115* 98 135*  BUN 23* 19 15  CREATININE 0.60 0.59 0.61  CALCIUM 8.1* 8.3* 8.2*  MG 1.4* 1.6*  --   AST  --  98* 41  ALT  --  17 7  ALKPHOS  --  123 126  BILITOT  --  0.4 0.5   ------------------------------------------------------------------------------------------------------------------ No results for input(s): CHOL, HDL, LDLCALC, TRIG, CHOLHDL, LDLDIRECT in the last 72 hours.  No results found for: HGBA1C ------------------------------------------------------------------------------------------------------------------ No results for input(s): TSH, T4TOTAL, T3FREE, THYROIDAB in the last 72 hours.  Invalid input(s): FREET3 ------------------------------------------------------------------------------------------------------------------ No results for input(s): VITAMINB12, FOLATE, FERRITIN, TIBC, IRON, RETICCTPCT in the last 72 hours.  Coagulation profile No results for input(s): INR, PROTIME in the last 168 hours.  No results for input(s): DDIMER in the last  72 hours.  Cardiac Enzymes No results for input(s): CKMB, TROPONINI, MYOGLOBIN in the last 168 hours.  Invalid input(s): CK ------------------------------------------------------------------------------------------------------------------    Component Value Date/Time   BNP 614.0 (H) 03/03/2018 0716    Inpatient Medications  Scheduled Meds:  buprenorphine-naloxone  1 tablet Sublingual BID   DULoxetine  30 mg Oral BID   feeding supplement  1 Container Oral BID BM   ferrous sulfate  325 mg Oral Q breakfast   furosemide  40 mg Oral BID   gabapentin  100 mg Oral TID   magnesium oxide  400 mg Oral BID   metoprolol tartrate  75 mg Oral BID   multivitamin with minerals  1 tablet Oral Daily   pantoprazole  40 mg Oral BID   potassium chloride  40 mEq Oral Once   senna-docusate  2 tablet Oral BID   sodium chloride flush  10-40 mL Intracatheter Q12H   Continuous Infusions:  sodium chloride Stopped (03/19/18 1641)    ceFAZolin (ANCEF) IV 2 g (03/23/18 1402)   magnesium sulfate 1 - 4 g bolus IVPB     PRN Meds:.sodium chloride, acetaminophen **OR** acetaminophen, albuterol, guaiFENesin-dextromethorphan, ipratropium-albuterol, ketorolac, ondansetron **OR** ondansetron (ZOFRAN) IV, pentafluoroprop-tetrafluoroeth, prochlorperazine, sodium chloride flush, traZODone  Micro Results No results found for this or any previous visit (from the past 240 hour(s)).  Radiology Reports Dg Chest 2 View  Result Date: 03/11/2018 CLINICAL  DATA:  Bacteremia.  Drug abuse. EXAM: CHEST - 2 VIEW COMPARISON:  March 01, 2018 FINDINGS: The right PICC line terminates in the SVC, unchanged. Bilateral patchy pulmonary infiltrates persist with moderate bilateral pleural effusions, stable. The cardiomediastinal silhouette is unchanged. IMPRESSION: Persistent bilateral pleural effusions and patchy bilateral pulmonary infiltrates. Electronically Signed   By: Gerome Sam III M.D   On: 03/11/2018 15:54    Dg Chest 2 View  Result Date: 02/24/2018 CLINICAL DATA:  Short of breath EXAM: CHEST - 2 VIEW COMPARISON:  02/20/2018 FINDINGS: Upper normal heart size. Stable left pleural effusion. Tiny right pleural effusion is stable. Patchy airspace opacities throughout both lungs are not significantly changed. No pneumothorax. IMPRESSION: Bilateral airspace disease and bilateral pleural effusions left greater than right are stable. Electronically Signed   By: Jolaine Click M.D.   On: 02/24/2018 15:50   Mr Lumbar Spine W Wo Contrast  Result Date: 02/26/2018 CLINICAL DATA:  Low back pain since prior abnormal MRI 02/08/2018. Unable to walk. EXAM: MRI LUMBAR SPINE WITHOUT AND WITH CONTRAST TECHNIQUE: Multiplanar and multiecho pulse sequences of the lumbar spine were obtained without and with intravenous contrast. CONTRAST:  7 mL Gadavist COMPARISON:  02/08/2018 FINDINGS: Segmentation:  Standard. Alignment:  Physiologic. Vertebrae:  No fracture, evidence of discitis, or bone lesion. Conus medullaris and cauda equina: Conus extends to the L1 level. Conus and cauda equina appear normal. Paraspinal and other soft tissues: Posterior paraspinal muscle edema bilaterally with mild enhancement on postcontrast imaging. No intramuscular fluid collection or hematoma. Disc levels: Disc spaces: Disc spaces are maintained. T12-L1: No significant disc bulge. No evidence of neural foraminal stenosis. No central canal stenosis. L1-L2: No significant disc bulge. No evidence of neural foraminal stenosis. No central canal stenosis. L2-L3: No significant disc bulge. No evidence of neural foraminal stenosis. No central canal stenosis. L3-L4: No significant disc bulge. No evidence of neural foraminal stenosis. No central canal stenosis. L4-L5: Minimal broad-based disc bulge. No evidence of neural foraminal stenosis. No central canal stenosis. L5-S1: Minimal broad-based disc bulge. No evidence of neural foraminal stenosis. No central canal  stenosis. IMPRESSION: 1. No epidural fluid collection.  No discitis or osteomyelitis. 2. Posterior paraspinal muscle edema bilaterally with mild enhancement on postcontrast imaging. No intramuscular fluid collection or hematoma. Differential considerations include muscle strain versus mild myositis. Electronically Signed   By: Elige Ko   On: 02/26/2018 09:53   Dg Chest Port 1 View  Result Date: 03/01/2018 CLINICAL DATA:  Shortness of breath EXAM: PORTABLE CHEST 1 VIEW COMPARISON:  02/24/2018 FINDINGS: Cardiomegaly. Moderate to large bilateral pleural effusions, stable on the left, increasing on the right since prior study. Diffuse bilateral airspace disease has worsened since prior study and slightly more pronounced on the right. This could reflect asymmetric edema or pneumonia. IMPRESSION: Moderate to large bilateral pleural effusions, increasing on the right since prior study. Diffuse bilateral airspace disease, right greater than left, worsening since prior study. This could reflect asymmetric edema or pneumonia. Electronically Signed   By: Charlett Nose M.D.   On: 03/01/2018 18:14   [place] Ekg Site Rite  Result Date: 02/26/2018 If Site Rite image not attached, placement could not be confirmed due to current cardiac rhythm.   Time Spent in minutes 25   Erick Blinks M.D on 03/23/2018 at 5:32 PM  Between 7am to 7pm   After 7pm go to [email or web address]   [facility] -  Office  [phone]

## 2018-03-23 NOTE — Progress Notes (Signed)
Patient refusing 0300 CBG and ambulation.

## 2018-03-23 NOTE — Progress Notes (Signed)
Pt refused 8 AM CBG.

## 2018-03-24 DIAGNOSIS — R600 Localized edema: Secondary | ICD-10-CM

## 2018-03-24 LAB — GLUCOSE, CAPILLARY
GLUCOSE-CAPILLARY: 82 mg/dL (ref 70–99)
Glucose-Capillary: 97 mg/dL (ref 70–99)
Glucose-Capillary: 148 mg/dL — ABNORMAL HIGH (ref 70–99)
Glucose-Capillary: 145 mg/dL — ABNORMAL HIGH (ref 70–99)
Glucose-Capillary: 124 mg/dL — ABNORMAL HIGH (ref 70–99)

## 2018-03-24 MED ORDER — POLYETHYLENE GLYCOL 3350 17 G PO PACK
17.0000 g | PACK | Freq: Two times a day (BID) | ORAL | Status: DC
  Administered 2018-03-24 – 2018-03-26 (×4): 17 g via ORAL
  Filled 2018-03-24 (×4): qty 1

## 2018-03-24 NOTE — Progress Notes (Signed)
PROGRESS NOTE                                                                                                                                                                                                             Patient Demographics:    Amy Mcknight, is a 23 y.o. female, DOB - 31-Mar-1995, NWG:[id number]  Admit date - 02/08/2018   Admitting Physician Charlsie Quest, MD  Outpatient Primary MD for the patient is Patient, No Pcp Per  LOS - 44  Outpatient Specialists: None  No chief complaint on file.      Brief Narrative   23 year old female with history of substance use disorder presented to the ED with 2-day history of right lower back pain of acute onset with associated diaphoresis, chills, shortness of breath, productive cough and right chest wall pain.  She has active heroin use last use 1 month prior to admission, regular recreational drug use (snorting nasally), reports history of opiate withdrawal.  Urine drug screen was positive for marijuana and opiates. 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  Requiring hospital stay for 6 weeks of antibiotic therapy.  Hospital course complicated with development of acute diastolic CHF and treatment noncompliance.   Subjective:   Pt denies any specific complaints.  Pt not cooperating for exam.     Assessment  & Plan :   MSSA bacteremia Initial presentation was sepsis with septic shock.  Has tricuspid endocarditis, paravertebral myositis and cavitary pneumonia. Sepsis has resolved.  Case was discussed with ID who recommended inpatient IV antibiotic with Ancef for total 6 weeks duration until 3/23. Repeat blood cultures have been negative.  Acute diastolic CHF. Gained 14 kg since admission.  Attempting to diurese with Lasix but intermittently refusing meds, getting I's and O's  and regular vitals.     Echo shows severe TR with LVEF of 60-65%. Counseled again on importance of monitoring her weight, I's and O's and taking her meds.. Continue p.o. Lasix 40 mg twice daily (refusing IV). Cardiology consult appreciated.  Severe sepsis and cavitary pneumonia. Metastatic from MSSA bacteremia/endocarditis.  Hemodynamically stable.  Continue Ancef.  Leukocytosis WBC trended down.  Last chest x-ray on 3/8 with persistent effusion.  Remains afebrile.  Hypokalemia Replenished.  Acute kidney injury Secondary to sepsis.  Serum  creatinine peaked at 2.61.  Now resolved.  Positive hepatitis C antibody Quantitative viral load undetectable.  Polysubstance abuse with?  Opiate withdrawal. Avoiding all narcotics.  Was started on Suboxone on 3/11.  Generalized weakness and physical debility  Intermittent PT.  Patient refusing to get out of bed or ambulate.  Anemia of chronic disease Stable.  Received 2 unit PRBC on 2/12.  No intervention per GI.  Continue PPI.  Received Feraheme this admission.  Bilateral lower extremity edema Patient has refused lower extremity Dopplers on more than one occasion.  Pt refusing lasix and only takes intermittently.   Anxiety and depression Continue Cymbalta.  Acute respiratory failure with hypoxia.  Patient becomes hypoxic when oxygen is removed. Patient often removes nasal cannula and is resistant to when she is asked to reapply this. Hypoxia likely related to pleural effusions noted prior chest xray although with immobility there was concern for DVT. Pt refused multiple times to have the testing done.    Acute encephalopathy Mental status is now improved after adjusting medications.    Code Status : Full code  Family Communication  : None at bedside  Disposition Plan  : Home after completion of IV antibiotic on 3/23  Barriers For Discharge : Inpatient IV antibiotic  Consults  : GI, cardiology  Procedures  : 2D echo, MRI lumbar  spine,  DVT Prophylaxis  : SCDs  Lab Results  Component Value Date   PLT 227 03/23/2018    Antibiotics  :   Anti-infectives (From admission, onward)   Start     Dose/Rate Route Frequency Ordered Stop   02/09/18 1500  vancomycin (VANCOCIN) IVPB 750 mg/150 ml premix  Status:  Discontinued     750 mg 150 mL/hr over 60 Minutes Intravenous Every 24 hours 02/08/18 1553 02/09/18 1201   02/09/18 1400  ceFAZolin (ANCEF) IVPB 2g/100 mL premix     2 g 200 mL/hr over 30 Minutes Intravenous Every 8 hours 02/09/18 1220 03/26/18 2359   02/09/18 1300  ceFAZolin (ANCEF) 2 g in dextrose 5 % 100 mL IVPB  Status:  Discontinued     2 g 200 mL/hr over 30 Minutes Intravenous Every 8 hours 02/09/18 1201 02/09/18 1220   02/09/18 0000  meropenem (MERREM) 1 g in sodium chloride 0.9 % 100 mL IVPB  Status:  Discontinued     1 g 200 mL/hr over 30 Minutes Intravenous Every 12 hours 02/08/18 1459 02/09/18 0853   02/08/18 1445  vancomycin (VANCOCIN) IVPB 1000 mg/200 mL premix     1,000 mg 200 mL/hr over 60 Minutes Intravenous  Once 02/08/18 1442 02/08/18 1555   02/08/18 1415  meropenem (MERREM) 1 g in sodium chloride 0.9 % 100 mL IVPB     1 g 200 mL/hr over 30 Minutes Intravenous  Once 02/08/18 1402 02/08/18 1455        Objective:   Vitals:   03/23/18 0629 03/23/18 2217 03/24/18 0500 03/24/18 0615  BP:  108/77  102/76  Pulse:  94  98  Resp:  20  20  Temp:  (!) 97.5 F (36.4 C)  98 F (36.7 C)  TempSrc:  Oral  Oral  SpO2:  96%  100%  Weight: 75.6 kg  75.1 kg   Height:        Wt Readings from Last 3 Encounters:  03/24/18 75.1 kg     Intake/Output Summary (Last 24 hours) at 03/24/2018 1119 Last data filed at 03/24/2018 0800 Gross per 24 hour  Intake 840 ml  Output --  Net 840 ml    Physical exam General exam: Alert, awake, oriented x 3 Respiratory system: Clear to auscultation. Respiratory effort normal. Cardiovascular system: normal s1,s2 sounds.  No murmurs, rubs,  gallops. Gastrointestinal system: Abdomen is nondistended, soft and nontender. No organomegaly or masses felt. Normal bowel sounds heard. Central nervous system: Alert and oriented. No focal neurological deficits. Extremities: 1+ edema bilaterally Skin: No rashes, lesions or ulcers Psychiatry: Judgement and insight appear normal. Mood & affect appropriate.    Data Review:    CBC Recent Labs  Lab 03/20/18 0507 03/23/18 0529  WBC 9.8 13.6*  HGB 10.0* 10.1*  HCT 35.5* 34.7*  PLT 249 227  MCV 96.2 95.9  MCH 27.1 27.9  MCHC 28.2* 29.1*  RDW 20.9* 20.3*    Chemistries  Recent Labs  Lab 03/20/18 0507 03/23/18 0529  NA 136 135  K 3.9 4.3  CL 97* 97*  CO2 30 28  GLUCOSE 98 135*  BUN 19 15  CREATININE 0.59 0.61  CALCIUM 8.3* 8.2*  MG 1.6*  --   AST 98* 41  ALT 17 7  ALKPHOS 123 126  BILITOT 0.4 0.5   ------------------------------------------------------------------------------------------------------------------ No results for input(s): CHOL, HDL, LDLCALC, TRIG, CHOLHDL, LDLDIRECT in the last 72 hours.  No results found for: HGBA1C ------------------------------------------------------------------------------------------------------------------ No results for input(s): TSH, T4TOTAL, T3FREE, THYROIDAB in the last 72 hours.  Invalid input(s): FREET3 ------------------------------------------------------------------------------------------------------------------ No results for input(s): VITAMINB12, FOLATE, FERRITIN, TIBC, IRON, RETICCTPCT in the last 72 hours.  Coagulation profile No results for input(s): INR, PROTIME in the last 168 hours.  No results for input(s): DDIMER in the last 72 hours.  Cardiac Enzymes No results for input(s): CKMB, TROPONINI, MYOGLOBIN in the last 168 hours.  Invalid input(s): CK ------------------------------------------------------------------------------------------------------------------    Component Value Date/Time   BNP 614.0  (H) 03/03/2018 0716    Inpatient Medications  Scheduled Meds:  buprenorphine-naloxone  1 tablet Sublingual BID   DULoxetine  30 mg Oral BID   feeding supplement  1 Container Oral BID BM   ferrous sulfate  325 mg Oral Q breakfast   furosemide  40 mg Oral BID   gabapentin  100 mg Oral TID   magnesium oxide  400 mg Oral BID   metoprolol tartrate  75 mg Oral BID   multivitamin with minerals  1 tablet Oral Daily   pantoprazole  40 mg Oral BID   potassium chloride  40 mEq Oral Once   senna-docusate  2 tablet Oral BID   sodium chloride flush  10-40 mL Intracatheter Q12H   Continuous Infusions:  sodium chloride Stopped (03/19/18 1641)    ceFAZolin (ANCEF) IV 2 g (03/24/18 0619)   magnesium sulfate 1 - 4 g bolus IVPB     PRN Meds:.sodium chloride, acetaminophen **OR** acetaminophen, albuterol, guaiFENesin-dextromethorphan, ipratropium-albuterol, ketorolac, ondansetron **OR** ondansetron (ZOFRAN) IV, pentafluoroprop-tetrafluoroeth, prochlorperazine, sodium chloride flush, traZODone  Micro Results No results found for this or any previous visit (from the past 240 hour(s)).  Radiology Reports Dg Chest 2 View  Result Date: 03/11/2018 CLINICAL DATA:  Bacteremia.  Drug abuse. EXAM: CHEST - 2 VIEW COMPARISON:  March 01, 2018 FINDINGS: The right PICC line terminates in the SVC, unchanged. Bilateral patchy pulmonary infiltrates persist with moderate bilateral pleural effusions, stable. The cardiomediastinal silhouette is unchanged. IMPRESSION: Persistent bilateral pleural effusions and patchy bilateral pulmonary infiltrates. Electronically Signed   By: Gerome Sam III M.D   On: 03/11/2018 15:54   Dg Chest 2 View  Result Date: 02/24/2018  CLINICAL DATA:  Short of breath EXAM: CHEST - 2 VIEW COMPARISON:  02/20/2018 FINDINGS: Upper normal heart size. Stable left pleural effusion. Tiny right pleural effusion is stable. Patchy airspace opacities throughout both lungs are not  significantly changed. No pneumothorax. IMPRESSION: Bilateral airspace disease and bilateral pleural effusions left greater than right are stable. Electronically Signed   By: Jolaine Click M.D.   On: 02/24/2018 15:50   Mr Lumbar Spine W Wo Contrast  Result Date: 02/26/2018 CLINICAL DATA:  Low back pain since prior abnormal MRI 02/08/2018. Unable to walk. EXAM: MRI LUMBAR SPINE WITHOUT AND WITH CONTRAST TECHNIQUE: Multiplanar and multiecho pulse sequences of the lumbar spine were obtained without and with intravenous contrast. CONTRAST:  7 mL Gadavist COMPARISON:  02/08/2018 FINDINGS: Segmentation:  Standard. Alignment:  Physiologic. Vertebrae:  No fracture, evidence of discitis, or bone lesion. Conus medullaris and cauda equina: Conus extends to the L1 level. Conus and cauda equina appear normal. Paraspinal and other soft tissues: Posterior paraspinal muscle edema bilaterally with mild enhancement on postcontrast imaging. No intramuscular fluid collection or hematoma. Disc levels: Disc spaces: Disc spaces are maintained. T12-L1: No significant disc bulge. No evidence of neural foraminal stenosis. No central canal stenosis. L1-L2: No significant disc bulge. No evidence of neural foraminal stenosis. No central canal stenosis. L2-L3: No significant disc bulge. No evidence of neural foraminal stenosis. No central canal stenosis. L3-L4: No significant disc bulge. No evidence of neural foraminal stenosis. No central canal stenosis. L4-L5: Minimal broad-based disc bulge. No evidence of neural foraminal stenosis. No central canal stenosis. L5-S1: Minimal broad-based disc bulge. No evidence of neural foraminal stenosis. No central canal stenosis. IMPRESSION: 1. No epidural fluid collection.  No discitis or osteomyelitis. 2. Posterior paraspinal muscle edema bilaterally with mild enhancement on postcontrast imaging. No intramuscular fluid collection or hematoma. Differential considerations include muscle strain versus mild  myositis. Electronically Signed   By: Elige Ko   On: 02/26/2018 09:53   Dg Chest Port 1 View  Result Date: 03/01/2018 CLINICAL DATA:  Shortness of breath EXAM: PORTABLE CHEST 1 VIEW COMPARISON:  02/24/2018 FINDINGS: Cardiomegaly. Moderate to large bilateral pleural effusions, stable on the left, increasing on the right since prior study. Diffuse bilateral airspace disease has worsened since prior study and slightly more pronounced on the right. This could reflect asymmetric edema or pneumonia. IMPRESSION: Moderate to large bilateral pleural effusions, increasing on the right since prior study. Diffuse bilateral airspace disease, right greater than left, worsening since prior study. This could reflect asymmetric edema or pneumonia. Electronically Signed   By: Charlett Nose M.D.   On: 03/01/2018 18:14   [place] Ekg Site Rite  Result Date: 02/26/2018 If Site Rite image not attached, placement could not be confirmed due to current cardiac rhythm.  Time Spent in minutes 25   Standley Dakins M.D on 03/24/2018 at 11:19 AM  How to contact the [facility] Attending or Consulting provider 7A - 7P or covering provider during after hours 7P -7A, for this patient?  1. Check the care team in [facility] and look for a) attending/consulting [facility] provider listed and b) the [facility] team listed 2. Log into [email or web address] and use [facility]'s universal password to access. If you do not have the password, please contact the hospital operator. 3. Locate the [facility] provider you are looking for under [facility] and page to a number that you can be directly reached. 4. If you still have difficulty reaching the provider, please page the [facility] (Director on Call) for  the Hospitalists listed on amion for assistance.

## 2018-03-24 NOTE — Progress Notes (Signed)
Pt refusing all morning meds and states she might take them later.  Pt educated on symptoms we are trying to manage and consequences we want to avoid but she still refuses.  At this time she states, "I dont' want to talk about it now, I'm fine" and is refusing assessment.

## 2018-03-24 NOTE — Progress Notes (Signed)
Pt has taken all of her morning meds except Lasix.  Pt educated and states she will take it later.

## 2018-03-25 ENCOUNTER — Inpatient Hospital Stay (HOSPITAL_COMMUNITY): Payer: Self-pay

## 2018-03-25 ENCOUNTER — Encounter (HOSPITAL_COMMUNITY): Payer: Self-pay | Admitting: Internal Medicine

## 2018-03-25 LAB — GLUCOSE, CAPILLARY
Glucose-Capillary: 108 mg/dL — ABNORMAL HIGH (ref 70–99)
Glucose-Capillary: 98 mg/dL (ref 70–99)
Glucose-Capillary: 96 mg/dL (ref 70–99)

## 2018-03-25 MED ORDER — TUBERCULIN PPD 5 UNIT/0.1ML ID SOLN
5.0000 [IU] | Freq: Once | INTRADERMAL | Status: DC
  Filled 2018-03-25: qty 0.1

## 2018-03-25 NOTE — Progress Notes (Signed)
Spoke w/Pt's mom to make sure family is aware of discharge plan.  Ms. Tasker states that Shanina's cousin, Fredonia Highland, will be here to pick her up tomorrow and Shekinah will go to [facility] home in [place].

## 2018-03-25 NOTE — Progress Notes (Signed)
Pt placed on room air at 1600.  O2 sat is between 94-97% on room air with normal depth of breathing.  When pt takes short, shallow, panting breaths O2 sat is in low 90s.  She is currently refusing oximetery.

## 2018-03-25 NOTE — Progress Notes (Signed)
Night shift floor coverage note.  The staff reported that the patient had sputum with hemoptysis.  She was placed in isolation and PPD ordered.  However, on further review of the chart.  The patient has cavitary pneumonia due to MSSA bacteremia with native tricuspid valve endocarditis, which was complicated with cavitary pneumonia.  Given her history of IV drug abuse, I placed her on airborne precautions and ordered PPD testing to be done in the morning.  However, I we do not have a very high suspicion for TB and I will discuss the case with Dr. Laural Benes in the morning to reevaluate the risk of tuberculosis.  Sanda Klein, MD

## 2018-03-25 NOTE — Progress Notes (Signed)
Pt refused evening Lasix after education and strong encouragement.

## 2018-03-25 NOTE — Progress Notes (Signed)
PROGRESS NOTE                                                                                                                                                                                                             Patient Demographics:    Amy Mcknight, is a 23 y.o. female, DOB - December 28, 1995, ZOX:[id number]  Admit date - 02/08/2018   Admitting Physician Charlsie Quest, MD  Outpatient Primary MD for the patient is Patient, No Pcp Per  LOS - 45  Outpatient Specialists: None  No chief complaint on file.      Brief Narrative   23 year old female with history of substance use disorder presented to the ED with 2-day history of right lower back pain of acute onset with associated diaphoresis, chills, shortness of breath, productive cough and right chest wall pain.  She has active heroin use last use 1 month prior to admission, regular recreational drug use (snorting nasally), reports history of opiate withdrawal.  Urine drug screen was positive for marijuana and opiates. 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  Requiring hospital stay for 6 weeks of antibiotic therapy.  Hospital course complicated with development of acute diastolic CHF and treatment noncompliance.   Subjective:   Pt denies any specific complaints.  Pt asking to be discharged on pain medications.       Assessment  & Plan :   MSSA bacteremia Initial presentation was sepsis with septic shock.  Has tricuspid endocarditis, paravertebral myositis and cavitary pneumonia. Sepsis has resolved.  Case was discussed with ID who recommended inpatient IV antibiotic with Ancef for total 6 weeks duration until 3/23. Repeat blood cultures have been negative.  Acute diastolic CHF. Gained 14 kg since admission.  Attempting to diurese with Lasix but intermittently refusing meds,  getting I's and O's and regular vitals.     Echo shows severe TR with LVEF of 60-65%. Counseled again on importance of monitoring her weight, I's and O's and taking her meds.. Continue p.o. Lasix 40 mg twice daily (refusing IV). Cardiology consult appreciated.  Severe sepsis and cavitary pneumonia. Metastatic from MSSA bacteremia/endocarditis.  Hemodynamically stable.  Continue Ancef.  Leukocytosis WBC trended down.  Last chest x-ray on 3/8 with persistent effusion.  Remains afebrile.  Hypokalemia Replenished.  Acute kidney injury  Secondary to sepsis.  Serum creatinine peaked at 2.61.  Now resolved.  Positive hepatitis C antibody Quantitative viral load undetectable.  Polysubstance abuse with?  Opiate withdrawal. Started on Suboxone on 3/11.  Generalized weakness and physical debility  Intermittent PT.  Patient refusing to get out of bed or ambulate.  Anemia of chronic disease Stable.  Received 2 unit PRBC on 2/12.  No intervention per GI.  Continue PPI.  Received Feraheme this admission.  Bilateral lower extremity edema Patient has refused lower extremity Dopplers on more than one occasion.  Pt refusing lasix and only takes intermittently.   Anxiety and depression Continue Cymbalta.  Acute respiratory failure with hypoxia.  Patient becomes hypoxic when oxygen is removed. Patient often removes nasal cannula and is resistant to when she is asked to reapply this. Hypoxia likely related to pleural effusions noted prior chest xray although with immobility there was concern for DVT. Pt refused multiple times to have the testing done.    Acute encephalopathy Mental status is now improved after adjusting medications.    Code Status : Full code  Family Communication  : None at bedside  Disposition Plan  : Home after completion of IV antibiotic on 3/23  Barriers For Discharge : Inpatient IV antibiotic  Consults  : GI, cardiology  Procedures  : 2D echo, MRI lumbar  spine  DVT Prophylaxis  : SCDs  Lab Results  Component Value Date   PLT 227 03/23/2018    Antibiotics  :   Anti-infectives (From admission, onward)   Start     Dose/Rate Route Frequency Ordered Stop   02/09/18 1500  vancomycin (VANCOCIN) IVPB 750 mg/150 ml premix  Status:  Discontinued     750 mg 150 mL/hr over 60 Minutes Intravenous Every 24 hours 02/08/18 1553 02/09/18 1201   02/09/18 1400  ceFAZolin (ANCEF) IVPB 2g/100 mL premix     2 g 200 mL/hr over 30 Minutes Intravenous Every 8 hours 02/09/18 1220 03/26/18 2359   02/09/18 1300  ceFAZolin (ANCEF) 2 g in dextrose 5 % 100 mL IVPB  Status:  Discontinued     2 g 200 mL/hr over 30 Minutes Intravenous Every 8 hours 02/09/18 1201 02/09/18 1220   02/09/18 0000  meropenem (MERREM) 1 g in sodium chloride 0.9 % 100 mL IVPB  Status:  Discontinued     1 g 200 mL/hr over 30 Minutes Intravenous Every 12 hours 02/08/18 1459 02/09/18 0853   02/08/18 1445  vancomycin (VANCOCIN) IVPB 1000 mg/200 mL premix     1,000 mg 200 mL/hr over 60 Minutes Intravenous  Once 02/08/18 1442 02/08/18 1555   02/08/18 1415  meropenem (MERREM) 1 g in sodium chloride 0.9 % 100 mL IVPB     1 g 200 mL/hr over 30 Minutes Intravenous  Once 02/08/18 1402 02/08/18 1455        Objective:   Vitals:   03/24/18 2102 03/25/18 0520 03/25/18 0736 03/25/18 1121  BP: 118/83 101/70    Pulse: (!) 109 (!) 101    Resp:  20    Temp: 98.5 F (36.9 C) 98.4 F (36.9 C)    TempSrc: Oral Oral    SpO2: 99% 98% 99% 98%  Weight:  75.2 kg    Height:        Wt Readings from Last 3 Encounters:  03/25/18 75.2 kg     Intake/Output Summary (Last 24 hours) at 03/25/2018 1128 Last data filed at 03/25/2018 0900 Gross per 24 hour  Intake 1640 ml  Output --  Net 1640 ml    Physical exam General exam: Alert, awake, oriented x 3 Respiratory system: Clear to auscultation. Respiratory effort normal. Cardiovascular system: normal s1,s2 sounds.  No murmurs, rubs,  gallops. Gastrointestinal system: Abdomen is nondistended, soft and nontender. No organomegaly or masses felt. Normal bowel sounds heard. Central nervous system: Alert and oriented. No focal neurological deficits. Extremities: 1+ edema bilaterally - unchanged Skin: No rashes, lesions or ulcers Psychiatry: Judgement and insight appear normal. Mood & affect appropriate.    Data Review:    CBC Recent Labs  Lab 03/20/18 0507 03/23/18 0529  WBC 9.8 13.6*  HGB 10.0* 10.1*  HCT 35.5* 34.7*  PLT 249 227  MCV 96.2 95.9  MCH 27.1 27.9  MCHC 28.2* 29.1*  RDW 20.9* 20.3*    Chemistries  Recent Labs  Lab 03/20/18 0507 03/23/18 0529  NA 136 135  K 3.9 4.3  CL 97* 97*  CO2 30 28  GLUCOSE 98 135*  BUN 19 15  CREATININE 0.59 0.61  CALCIUM 8.3* 8.2*  MG 1.6*  --   AST 98* 41  ALT 17 7  ALKPHOS 123 126  BILITOT 0.4 0.5   ------------------------------------------------------------------------------------------------------------------ No results for input(s): CHOL, HDL, LDLCALC, TRIG, CHOLHDL, LDLDIRECT in the last 72 hours.  No results found for: HGBA1C ------------------------------------------------------------------------------------------------------------------ No results for input(s): TSH, T4TOTAL, T3FREE, THYROIDAB in the last 72 hours.  Invalid input(s): FREET3 ------------------------------------------------------------------------------------------------------------------ No results for input(s): VITAMINB12, FOLATE, FERRITIN, TIBC, IRON, RETICCTPCT in the last 72 hours.  Coagulation profile No results for input(s): INR, PROTIME in the last 168 hours.  No results for input(s): DDIMER in the last 72 hours.  Cardiac Enzymes No results for input(s): CKMB, TROPONINI, MYOGLOBIN in the last 168 hours.  Invalid input(s): CK ------------------------------------------------------------------------------------------------------------------    Component Value Date/Time    BNP 614.0 (H) 03/03/2018 0716    Inpatient Medications  Scheduled Meds:  buprenorphine-naloxone  1 tablet Sublingual BID   DULoxetine  30 mg Oral BID   feeding supplement  1 Container Oral BID BM   ferrous sulfate  325 mg Oral Q breakfast   furosemide  40 mg Oral BID   gabapentin  100 mg Oral TID   magnesium oxide  400 mg Oral BID   metoprolol tartrate  75 mg Oral BID   multivitamin with minerals  1 tablet Oral Daily   pantoprazole  40 mg Oral BID   polyethylene glycol  17 g Oral BID   potassium chloride  40 mEq Oral Once   senna-docusate  2 tablet Oral BID   sodium chloride flush  10-40 mL Intracatheter Q12H   Continuous Infusions:  sodium chloride Stopped (03/19/18 1641)    ceFAZolin (ANCEF) IV 2 g (03/25/18 0645)   magnesium sulfate 1 - 4 g bolus IVPB     PRN Meds:.sodium chloride, acetaminophen **OR** acetaminophen, albuterol, guaiFENesin-dextromethorphan, ipratropium-albuterol, ondansetron **OR** ondansetron (ZOFRAN) IV, pentafluoroprop-tetrafluoroeth, prochlorperazine, sodium chloride flush, traZODone  Micro Results No results found for this or any previous visit (from the past 240 hour(s)).  Radiology Reports Dg Chest 2 View  Result Date: 03/11/2018 CLINICAL DATA:  Bacteremia.  Drug abuse. EXAM: CHEST - 2 VIEW COMPARISON:  March 01, 2018 FINDINGS: The right PICC line terminates in the SVC, unchanged. Bilateral patchy pulmonary infiltrates persist with moderate bilateral pleural effusions, stable. The cardiomediastinal silhouette is unchanged. IMPRESSION: Persistent bilateral pleural effusions and patchy bilateral pulmonary infiltrates. Electronically Signed   By: Gerome Sam III M.D   On: 03/11/2018 15:54  Dg Chest 2 View  Result Date: 02/24/2018 CLINICAL DATA:  Short of breath EXAM: CHEST - 2 VIEW COMPARISON:  02/20/2018 FINDINGS: Upper normal heart size. Stable left pleural effusion. Tiny right pleural effusion is stable. Patchy airspace  opacities throughout both lungs are not significantly changed. No pneumothorax. IMPRESSION: Bilateral airspace disease and bilateral pleural effusions left greater than right are stable. Electronically Signed   By: Jolaine Click M.D.   On: 02/24/2018 15:50   Mr Lumbar Spine W Wo Contrast  Result Date: 02/26/2018 CLINICAL DATA:  Low back pain since prior abnormal MRI 02/08/2018. Unable to walk. EXAM: MRI LUMBAR SPINE WITHOUT AND WITH CONTRAST TECHNIQUE: Multiplanar and multiecho pulse sequences of the lumbar spine were obtained without and with intravenous contrast. CONTRAST:  7 mL Gadavist COMPARISON:  02/08/2018 FINDINGS: Segmentation:  Standard. Alignment:  Physiologic. Vertebrae:  No fracture, evidence of discitis, or bone lesion. Conus medullaris and cauda equina: Conus extends to the L1 level. Conus and cauda equina appear normal. Paraspinal and other soft tissues: Posterior paraspinal muscle edema bilaterally with mild enhancement on postcontrast imaging. No intramuscular fluid collection or hematoma. Disc levels: Disc spaces: Disc spaces are maintained. T12-L1: No significant disc bulge. No evidence of neural foraminal stenosis. No central canal stenosis. L1-L2: No significant disc bulge. No evidence of neural foraminal stenosis. No central canal stenosis. L2-L3: No significant disc bulge. No evidence of neural foraminal stenosis. No central canal stenosis. L3-L4: No significant disc bulge. No evidence of neural foraminal stenosis. No central canal stenosis. L4-L5: Minimal broad-based disc bulge. No evidence of neural foraminal stenosis. No central canal stenosis. L5-S1: Minimal broad-based disc bulge. No evidence of neural foraminal stenosis. No central canal stenosis. IMPRESSION: 1. No epidural fluid collection.  No discitis or osteomyelitis. 2. Posterior paraspinal muscle edema bilaterally with mild enhancement on postcontrast imaging. No intramuscular fluid collection or hematoma. Differential  considerations include muscle strain versus mild myositis. Electronically Signed   By: Elige Ko   On: 02/26/2018 09:53   Dg Chest Port 1 View  Result Date: 03/01/2018 CLINICAL DATA:  Shortness of breath EXAM: PORTABLE CHEST 1 VIEW COMPARISON:  02/24/2018 FINDINGS: Cardiomegaly. Moderate to large bilateral pleural effusions, stable on the left, increasing on the right since prior study. Diffuse bilateral airspace disease has worsened since prior study and slightly more pronounced on the right. This could reflect asymmetric edema or pneumonia. IMPRESSION: Moderate to large bilateral pleural effusions, increasing on the right since prior study. Diffuse bilateral airspace disease, right greater than left, worsening since prior study. This could reflect asymmetric edema or pneumonia. Electronically Signed   By: Charlett Nose M.D.   On: 03/01/2018 18:14   [place] Ekg Site Rite  Result Date: 02/26/2018 If Site Rite image not attached, placement could not be confirmed due to current cardiac rhythm.  Time Spent in minutes 25   Standley Dakins M.D on 03/25/2018 at 11:28 AM  How to contact the [facility] Attending or Consulting provider 7A - 7P or covering provider during after hours 7P -7A, for this patient?  1. Check the care team in [facility] and look for a) attending/consulting [facility] provider listed and b) the [facility] team listed 2. Log into [email or web address] and use [place]'s universal password to access. If you do not have the password, please contact the hospital operator. 3. Locate the [facility] provider you are looking for under [facility] and page to a number that you can be directly reached. 4. If you still have difficulty reaching the provider,  please page the [facility] (Director on Call) for the Hospitalists listed on amion for assistance.

## 2018-03-25 NOTE — Progress Notes (Signed)
Patient c/o coughing up blood.  Stated this started on 03/24/18 was light blood tinged mucus has progressively became more bloody.  While in the room with the patient she began coughing and coughed up a moderate amount of mostly bloody mucus.  Contacted Dr. Robb Matar, orders for chest xray and Airborne precautions were given.

## 2018-03-25 NOTE — Progress Notes (Signed)
Phone # for Jalah, Got[place] cousin who is to pick her up tomorrow, [phone].

## 2018-03-26 ENCOUNTER — Inpatient Hospital Stay (HOSPITAL_COMMUNITY): Payer: Self-pay

## 2018-03-26 LAB — GLUCOSE, CAPILLARY
Glucose-Capillary: 95 mg/dL (ref 70–99)
Glucose-Capillary: 80 mg/dL (ref 70–99)
Glucose-Capillary: 95 mg/dL (ref 70–99)

## 2018-03-26 MED ORDER — IPRATROPIUM-ALBUTEROL 0.5-2.5 (3) MG/3ML IN SOLN
3.0000 mL | Freq: Four times a day (QID) | RESPIRATORY_TRACT | Status: DC
  Administered 2018-03-26 (×2): 3 mL via RESPIRATORY_TRACT
  Filled 2018-03-26 (×2): qty 3

## 2018-03-26 MED ORDER — POLYETHYLENE GLYCOL 3350 17 G PO PACK: 17.0000 g | PACK | Freq: Every day | ORAL | 0 refills | Status: AC | PRN

## 2018-03-26 MED ORDER — GABAPENTIN 100 MG PO CAPS: 100.0000 mg | ORAL_CAPSULE | Freq: Three times a day (TID) | ORAL | 0 refills | Status: AC

## 2018-03-26 MED ORDER — FERROUS SULFATE 325 (65 FE) MG PO TABS: 325.0000 mg | ORAL_TABLET | Freq: Every day | ORAL | 3 refills | Status: AC

## 2018-03-26 MED ORDER — FUROSEMIDE 40 MG PO TABS: 40.0000 mg | ORAL_TABLET | Freq: Every day | ORAL | 0 refills | Status: AC

## 2018-03-26 MED ORDER — ADULT MULTIVITAMIN W/MINERALS CH: 1.0000 | ORAL_TABLET | Freq: Every day | ORAL | Status: AC

## 2018-03-26 MED ORDER — METOPROLOL TARTRATE 75 MG PO TABS: 75.0000 mg | ORAL_TABLET | Freq: Two times a day (BID) | ORAL | 0 refills | Status: AC

## 2018-03-26 MED ORDER — PANTOPRAZOLE SODIUM 40 MG PO TBEC: 40.0000 mg | DELAYED_RELEASE_TABLET | Freq: Every day | ORAL | 0 refills | Status: AC

## 2018-03-26 MED ORDER — DULOXETINE HCL 30 MG PO CPEP: 30.0000 mg | ORAL_CAPSULE | Freq: Two times a day (BID) | ORAL | 0 refills | Status: AC

## 2018-03-26 MED ORDER — POTASSIUM CHLORIDE ER 10 MEQ PO TBCR: 10.0000 meq | EXTENDED_RELEASE_TABLET | Freq: Every day | ORAL | 0 refills | Status: AC

## 2018-03-26 NOTE — Progress Notes (Signed)
Patient has been moved to 341 and placed on airborne, she will be tested for TB. She has began in the last 24-48 hrs coughing up bright red blood in her sputum, small to moderate amount. Breath sounds are diminished , upper airway sounds crackles or rales so suspect this is where blood is originating. Most likely will need a bronch at some point. She is a very complicated individual in that she has IV drug use hx along with mental issues. She tends to be her own worst enemy when it comes to her health care. So It will be doubtful how much can be done. Neb Txs have been rescheduled. She is 89 on 4 liters oxygen ,her respiratory rate is 24 to 28 and with low VT's.

## 2018-03-26 NOTE — Discharge Instructions (Signed)
Finding Treatment for Addiction Addiction is a complex disease of the brain that causes an uncontrollable (compulsive) need for:  A substance. This includes alcohol, illegal drugs, or prescription medicines, such as painkillers.  An activity or behavior, such as gambling or shopping. Addiction changes the way your brain works. Because of this change:  The need for the medicine, drug, or activity can become so strong that you think about it all the time.  Getting more and more of your addiction becomes the most important thing to you.  You may find yourself leaving other activities and relationships to pursue your addiction.  You can become physically dependent on a substance.  Your health, behavior, emotions, and relationships can change for the worse. How do I know if I need treatment for addiction? Addiction is a progressive disease. Without treatment, addiction can get worse. Living with addiction puts you at higher risk for injury, poor health, loss of employment, loss of money, and even death. You might need treatment for addiction if:  You have tried to stop or cut down, but you have not succeeded.  You find it annoying that your friends and family are concerned about your use or behavior.  You feel guilty about your use or behavior.  You need a particular substance or activity to start your day or to calm down.  You are running out of money because of your addiction.  You have done something illegal to support your addiction.  Your addiction has caused you: ? Health problems. ? Trouble in school, work, home, or with the police. ? To devote all your time to your addiction, and not to other responsibilities. ? To tell lies in order to hide your problem. What types of treatment are available? There may be options for treatment programs and plans based on your addiction, condition, needs, and preferences. No single treatment is right for everyone.  Treatment programs can  be: ? Outpatient. You live at home and go to work or school, but you go to a clinic for treatment. ? Inpatient. You live and sleep at the program facility during treatment.  Programs may include: ? Medicine. You may need medicine to treat the addiction itself, or to treat anxiety or depression. ? Counseling and behavior therapy. This can help individuals and families behave in healthier ways and relate more effectively. ? Support groups. Confidential group therapy, such as a 12-step program, can help individuals and families during treatment and recovery. ? A combination of education, counseling, and a 12-step, spirituality-based approach. What should I consider when selecting a treatment program? Think about your individual requirements when selecting a treatment program. Ask about:  The overall approach to treatment. ? Some programs are strictly 12-step programs. Some have a more flexible approach. ? Programs may differ in length of stay, setting, and size. ? Some programs include your family in your treatment plan. Support may be offered to them throughout the treatment process, as well as instructions for them when you are discharged. ? You may continue to receive support after you have left the program.  The types of medical services that are offered. Find out if the program: ? Offers specific treatment for your particular addiction. ? Meets all of your needs, including physical and cultural needs. ? Includes any medicines you might need. ? Offers mental health counseling as part of your treatment. ? Offers the 12-step meetings at the center, or if transport is available for patients to attend meetings at other locations.  The  cost and types of insurance that are accepted. ? Some programs are sponsored by the government. They support patients who do not have private insurance. ? If you do not have insurance, or if you choose to attend a program that does not accept your insurance,  call the treatment center. Tell them your financial needs and whether a payment plan can be set up. ? There are also organizations that will help you find the resources for treatment. You can find them online by searching "treatment for addiction."  If the program is certified by the appropriate government agency. Where to find support  Your health care provider can help you to find the right treatment. These discussions are confidential.  The [employer] on Alcoholism and Drug Dependence (NCADD). This group has information about treatment centers and programs for people who have an addiction and for family members. ? Call: 1-800-NCA-CALL (831-739-4389). ? Visit the website: https://www.ncadd.org/  The Substance Abuse and Mental Health Services Administration [facility]). This organization will help you find publicly funded treatment centers, help hotlines, and counseling services near you. ? Call: 1-800-662-HELP (314-545-2588). ? Visit the website: www.findtreatment.RockToxic.pl  The National Problem Gambling Helpline. This is a 24-hour confidential helpline for gambling addiction. ? Call: 430-836-8116 ? Visit the website: [employer].tn In countries outside of the [place] and [place], look in [employer] for contact information for services in your area. Follow these instructions at home:  Find supportive people who will help you stay away from your addiction and stay sober.  Do not use the substance or engage in the activity.  If you have been through treatment: ? Follow your plan. The plan is usually developed by you and your health care provider during treatment. ? Go to meetings with other people in recovery. ? Avoid people, situations, and things that lead you to do the things you are addicted to (triggers). Summary  Addiction changes the way your brain works. These changes cause a desire to repeat and increase the use of the a substance or  behavior.  Addiction is a progressive disease. Without treatment, addiction can get worse. Living with addiction puts you at higher risk for injury, poor health, loss of employment, loss of money, and even death.  There may be options for treatment programs and plans based on your addiction, condition, needs, and preferences. No single treatment is right for everyone.  Your health care provider can help you to find the right treatment. These discussions are confidential. This information is not intended to replace advice given to you by your health care provider. Make sure you discuss any questions you have with your health care provider. Document Released: 11/18/2004 Document Revised: 01/18/2017 Document Reviewed: 01/18/2017 Elsevier Interactive Patient Education  2019 [employer]. Endocarditis  Endocarditis is an infection of the inner layer of the heart (endocardium) or an infection of the heart valves. Endocarditis can cause growths inside the heart or on the heart valves. Over time, these growths can destroy heart tissue and cause heart failure or problems with heart rhythm. They can also cause stroke if they break away and form a blood clot in the brain. Early treatment offers the best chance for curing endocarditis and preventing complications. What are the causes? This condition may be caused by:  Germs that normally live in or on your body. The germs that most commonly cause endocarditis are bacteria.  A fungus. What increases the risk? This condition is more likely to develop in people who have:  A heart defect.  Artificial (prosthetic) heart valves.  An abnormal or damaged heart valve.  A history of endocarditis. Having certain procedures may also increase the risk of germs getting into the heart or bloodstream. What are the signs or symptoms? Symptoms of this condition may start suddenly, or they may start slowly and gradually get worse. Symptoms  include:  Fever.  Chills.  Night sweats.  Muscle aches.  Fatigue.  Weakness.  Shortness of breath.  Chest pain.  Blood spots in the eyes.  Bleeding under the fingernails or toenails.  Painless red spots on the palms.  Painful lumps in the fingertips or toes.  Swelling in the feet or ankles. How is this diagnosed? This condition may be diagnosed based on:  A physical exam. Your health care provider will listen to your heart to check for abnormal heart sounds (murmur). He or she may also use a scope to check for bleeding at the back of your eyes (retinas).  Tests. They may include: ? Blood tests to look for the germs that cause endocarditis. ? Imaging tests. A chest x-ray, CT scan, or echocardiogram may be used to create an image of your heart. A type of echocardiogram called a transesophageal echocardiogram may be done to look at certain heart valves more closely. How is this treated? Treatment for this condition depends on the cause of the endocarditis. Treatment may include:  Antibiotic medicines. These may be given through a tube into one of your veins (IV antibiotics) or taken by mouth. You may need to be on more than one antibiotic medicine.  Surgery to replace your heart valve. You may need surgery if: ? The endocarditis does not respond to treatment. ? You develop complications. ? Your heart valve is severely damaged. Follow these instructions at home: Medicines  Take over-the-counter and prescription medicines only as told by your health care provider.  If you were prescribed an antibiotic medicine, take it as told by your health care provider. Do not stop taking the antibiotic even if you start to feel better. You may need to be on intravenous antibiotics for several weeks.  Do not use IV drugs unless it is part of your medical treatment. Lifestyle  Do not get tattoos or body piercings.  Practice good oral hygiene. This includes: ? Brushing and  flossing regularly. ? Scheduling routine dental appointments.  Do not use any products that contain nicotine or tobacco, such as cigarettes and e-cigarettes. If you need help quitting, ask your health care provider.  Limit alcohol intake to no more than 1 drink a day for nonpregnant women and 2 drinks a day for men. One drink equals 12 oz of beer, 5 oz of wine, or 1 oz of hard liquor. General instructions  Let your health care provider know before you have any dental or surgical procedures. You may need to take antibiotics before the procedure.  Tell all of your health care providers, including your dentist, that you have had endocarditis.  Gradually resume your usual activities.  Keep all follow-up visits as told by your health care provider. This is important. Contact a health care provider if:  You have a fever.  Your symptoms do not improve.  Your symptoms get worse.  Your symptoms come back. Get help right away if:  You have trouble breathing.  You have chest pain.  You have symptoms of a stroke. These include: ? Sudden weakness. ? Numbness. ? Confusion. ? Trouble talking or understanding. ? A severe headache. Summary  Endocarditis is  an infection of the inner layer of the heart (endocardium) or heart valves. It is caused by bacteria or a fungus.  Having certain heart conditions or procedures may increase the risk of endocarditis.  Antibiotics are an important treatment for endocarditis. Take these medicines as told by your health care provider. Do not stop taking them even if you start to feel better.  Tell all of your health care providers, including your dentist, that you have had endocarditis. This information is not intended to replace advice given to you by your health care provider. Make sure you discuss any questions you have with your health care provider. Document Released: 12/20/2004 Document Revised: 10/02/2015 Document Reviewed: 10/02/2015 Elsevier  Interactive Patient Education  2019 Elsevier Inc.   IMPORTANT INFORMATION: PAY CLOSE ATTENTION   PHYSICIAN DISCHARGE INSTRUCTIONS  Follow with Primary care provider  Patient, No Pcp Per  and other consultants as instructed your Hospitalist Physician  SEEK MEDICAL CARE OR RETURN TO EMERGENCY ROOM IF SYMPTOMS COME BACK, WORSEN OR NEW PROBLEM DEVELOPS.   Please note: You were cared for by a hospitalist during your hospital stay. Every effort will be made to forward records to your primary care provider.  You can request that your primary care provider send for your hospital records if they have not received them.  Once you are discharged, your primary care physician will handle any further medical issues. Please note that NO REFILLS for any discharge medications will be authorized once you are discharged, as it is imperative that you return to your primary care physician (or establish a relationship with a primary care physician if you do not have one) for your post hospital discharge needs so that they can reassess your need for medications and monitor your lab values.  Please get a complete blood count and chemistry panel checked by your Primary MD at your next visit, and again as instructed by your Primary MD.  Get Medicines reviewed and adjusted: Please take all your medications with you for your next visit with your Primary MD  Laboratory/radiological data: Please request your Primary MD to go over all hospital tests and procedure/radiological results at the follow up, please ask your primary care provider to get all Hospital records sent to his/her office.  In some cases, they will be blood work, cultures and biopsy results pending at the time of your discharge. Please request that your primary care provider follow up on these results.  If you are diabetic, please bring your blood sugar readings with you to your follow up appointment with primary care.    Please call and make your  follow up appointments as soon as possible.    Also Note the following: If you experience worsening of your admission symptoms, develop shortness of breath, life threatening emergency, suicidal or homicidal thoughts you must seek medical attention immediately by calling 911 or calling your MD immediately  if symptoms less severe.  You must read complete instructions/literature along with all the possible adverse reactions/side effects for all the Medicines you take and that have been prescribed to you. Take any new Medicines after you have completely understood and accpet all the possible adverse reactions/side effects.   Do not drive when taking Pain medications or sleeping medications (Benzodiazepines)  Do not take more than prescribed Pain, Sleep and Anxiety Medications. It is not advisable to combine anxiety,sleep and pain medications without talking with your primary care practitioner  Special Instructions: If you have smoked or chewed Tobacco  in the  last 2 yrs please stop smoking, stop any regular Alcohol  and or any Recreational drug use.  Wear Seat belts while driving.    Illegal Drug Use Information, Adult Illegal drugs are chemicals and substances that are illegal to use, sell, or have (possess). Health care providers and pharmacies do not use or carry these types of drugs to treat medical problems because they can cause serious side effects and can lead to death. Examples of illegal drugs include:  Cocaine or crack.  Meth (methamphetamine) or crystal meth.  "Bath salts" (synthetic cathinones).  Heroin.  LSD, PCP, or acid.  Ecstasy. What is drug dependence? Using illegal drugs often leads to dependence or addiction. When you use certain drugs over a long period of time, your brain chemistry changes so that you can no longer function normally without that drug. This is called drug dependence. Drug dependence can cause you to:  Have unpleasant feelings and physical problems  when you stop using the drug (withdrawal).  Be unable to perform at work or at home, or do the activities you used to do, without using the drug. Some drugs make people feel so good that they want to use the drug again and again. This is called drug addiction. People who are addicted spend a lot of time seeking out the drug so that they can get the feeling they want from it. Addiction and dependence can be very hard to overcome. How can illegal drug use and dependence affect me? Using an illegal drug only once can have a major impact on your life. It is possible to die from side effects after using a drug just once. If you use an illegal drug repeatedly, you may need to take larger and larger doses of the drug to experience the feelings you want. Drug dependency and addiction may lead to:  Being unable to care for yourself and others.  Withdrawal, if you stop using the drug.  Negative effects on your relationships and work performance. It causes others not to trust you. If you have children, you could lose custody of them.  Behaving in ways that do not match your values.  Lying and crime, such as stealing.  Jail or prison. This can affect your ability to find a good job or continue your education.  Health problems such as tooth loss, skin problems, heart and lung disease, and stomach problems.  If you are a woman, you may have problems with pregnancy, including: ? Losing the pregnancy early (miscarriage), early delivery (premature birth), or delivering a lifeless infant (stillbirth). ? Slow or abnormal growth (birth defects) of your unborn child. ? Giving birth to a newborn who is addicted to illegal drugs.  A drug overdose. This is a dangerous situation that requires hospitalization and often leads to death. What are the benefits of avoiding illegal drug use? Avoiding illegal drug use can:  Keep your mind and body healthy. This can help improve work performance and participation in  activities.  Keep you from developing drug dependency or addiction. This can help you avoid negative side effects such as withdrawal and overdose.  Help you have healthy relationships with your friends and family.  Help you have more stable finances. Instead of using your money for drugs, you can: ? Spend it on things you would like to have. ? Save it for future use, such as for retirement or for big purchases like a car or a house.  Help you avoid a permanent criminal record, jail time,  or prison time. Having a clean record allows you to have more job and educational opportunities in the future. What actions can be taken? To avoid using illegal drugs:  Find healthy ways to cope with stress, such as exercise, meditation, or spending time with family and friends. Talk with your health care provider about how you feel and how to cope with stress.  Spend time with people who do not use illegal drugs, or make new friends who do not use drugs.  Do something else instead of using drugs. You can exercise, take up a hobby, or participate in activities that you can do with others.  Do not be afraid to say no if someone offers you an illegal drug. Speak up about why you do not want to use drugs. You can be a positive role model for others.  Work with a health care provider or counselor to create a program for yourself to help you deal with various aspects of your addiction. Where to find more information You can find more information about illegal drug use, dependence, and addiction from:  Your health care provider or mental health counselor.  Narcotics Anonymous: www.na.org  Substance Abuse and Mental Health Services Administration: ? Treatment finder: IdentityList.se ? National helpline: 1-800-662-HELP 780-178-5540) Contact a health care provider if:  You use illegal drugs.  You have missed family activities or work to use illegal drugs.  You have lied or stolen to get  illegal drugs.  You lose interest in things you used to enjoy, like hobbies or family activities.  Your eating or sleeping habits change as a result of drug use.  You use medicine to get the same effects as a drug. This is illegal use and can become addictive as well.  You stopped illegal drug use previously and you start actively using it again (relapse).  You want help to change your addictive behavior. Get help right away if:  You have thoughts about hurting yourself or others. If you ever feel like you may hurt yourself or others, or have thoughts about taking your own life, get help right away. You can go to your nearest emergency department or call:  Your local emergency services (911 in the [place]  A suicide crisis helpline, such as the National Suicide Prevention Lifeline at (959)437-1312. This is open 24 hours a day. Summary  Illegal drugs are chemicals and substances that are illegal to use, sell, or possess.  Using illegal drugs often leads to dependence or addiction. This means that you need the drug to feel normal.  If you use illegal drugs, look for resources to help you quit and find healthy ways to cope with stress. This information is not intended to replace advice given to you by your health care provider. Make sure you discuss any questions you have with your health care provider. Document Released: 06/15/2016 Document Revised: 06/15/2016 Document Reviewed: 06/15/2016 Elsevier Interactive Patient Education  2019 [employer].    Steps to Quit Smoking  Smoking tobacco can be harmful to your health and can affect almost every organ in your body. Smoking puts you, and those around you, at risk for developing many serious chronic diseases. Quitting smoking is difficult, but it is one of the best things that you can do for your health. It is never too late to quit. What are the benefits of quitting smoking? When you quit smoking, you lower your risk of developing  serious diseases and conditions, such as:  Lung cancer or lung  disease, such as COPD.  Heart disease.  Stroke.  Heart attack.  Infertility.  Osteoporosis and bone fractures. Additionally, symptoms such as coughing, wheezing, and shortness of breath may get better when you quit. You may also find that you get sick less often because your body is stronger at fighting off colds and infections. If you are pregnant, quitting smoking can help to reduce your chances of having a baby of low birth weight. How do I get ready to quit? When you decide to quit smoking, create a plan to make sure that you are successful. Before you quit:  Pick a date to quit. Set a date within the next two weeks to give you time to prepare.  Write down the reasons why you are quitting. Keep this list in places where you will see it often, such as on your bathroom mirror or in your car or wallet.  Identify the people, places, things, and activities that make you want to smoke (triggers) and avoid them. Make sure to take these actions: ? Throw away all cigarettes at home, at work, and in your car. ? Throw away smoking accessories, such as Set designer. ? Clean your car and make sure to empty the ashtray. ? Clean your home, including curtains and carpets.  Tell your family, friends, and coworkers that you are quitting. Support from your loved ones can make quitting easier.  Talk with your health care provider about your options for quitting smoking.  Find out what treatment options are covered by your health insurance. What strategies can I use to quit smoking? Talk with your healthcare provider about different strategies to quit smoking. Some strategies include:  Quitting smoking altogether instead of gradually lessening how much you smoke over a period of time. Research shows that quitting cold [place] is more successful than gradually quitting.  Attending in-person counseling to help you build  problem-solving skills. You are more likely to have success in quitting if you attend several counseling sessions. Even short sessions of 10 minutes can be effective.  Finding resources and support systems that can help you to quit smoking and remain smoke-free after you quit. These resources are most helpful when you use them often. They can include: ? Online chats with a Veterinary surgeon. ? Telephone quitlines. ? Automotive engineer. ? Support groups or group counseling. ? Text messaging programs. ? Mobile phone applications.  Taking medicines to help you quit smoking. (If you are pregnant or breastfeeding, talk with your health care provider first.) Some medicines contain nicotine and some do not. Both types of medicines help with cravings, but the medicines that include nicotine help to relieve withdrawal symptoms. Your health care provider may recommend: ? Nicotine patches, gum, or lozenges. ? Nicotine inhalers or sprays. ? Non-nicotine medicine that is taken by mouth. Talk with your health care provider about combining strategies, such as taking medicines while you are also receiving in-person counseling. Using these two strategies together makes you more likely to succeed in quitting than if you used either strategy on its own. If you are pregnant or breastfeeding, talk with your health care provider about finding counseling or other support strategies to quit smoking. Do not take medicine to help you quit smoking unless told to do so by your health care provider. What things can I do to make it easier to quit? Quitting smoking might feel overwhelming at first, but there is a lot that you can do to make it easier. Take these important actions:  Reach out to your family and friends and ask that they support and encourage you during this time. Call telephone quitlines, reach out to support groups, or work with a counselor for support.  Ask people who smoke to avoid smoking around  you.  Avoid places that trigger you to smoke, such as bars, parties, or smoke-break areas at work.  Spend time around people who do not smoke.  Lessen stress in your life, because stress can be a smoking trigger for some people. To lessen stress, try: ? Exercising regularly. ? Deep-breathing exercises. ? Yoga. ? Meditating. ? Performing a body scan. This involves closing your eyes, scanning your body from head to toe, and noticing which parts of your body are particularly tense. Purposefully relax the muscles in those areas.  Download or purchase mobile phone or tablet apps (applications) that can help you stick to your quit plan by providing reminders, tips, and encouragement. There are many free apps, such as QuitGuide from the [employer] developer for Disease Control and Prevention). You can find other support for quitting smoking (smoking cessation) through smokefree.gov and other websites. How will I feel when I quit smoking? Within the first 24 hours of quitting smoking, you may start to feel some withdrawal symptoms. These symptoms are usually most noticeable 2-3 days after quitting, but they usually do not last beyond 2-3 weeks. Changes or symptoms that you might experience include:  Mood swings.  Restlessness, anxiety, or irritation.  Difficulty concentrating.  Dizziness.  Strong cravings for sugary foods in addition to nicotine.  Mild weight gain.  Constipation.  Nausea.  Coughing or a sore throat.  Changes in how your medicines work in your body.  A depressed mood.  Difficulty sleeping (insomnia). After the first 2-3 weeks of quitting, you may start to notice more positive results, such as:  Improved sense of smell and taste.  Decreased coughing and sore throat.  Slower heart rate.  Lower blood pressure.  Clearer skin.  The ability to breathe more easily.  Fewer sick days. Quitting smoking is very challenging for most people. Do not get discouraged if you are  not successful the first time. Some people need to make many attempts to quit before they achieve long-term success. Do your best to stick to your quit plan, and talk with your health care provider if you have any questions or concerns. This information is not intended to replace advice given to you by your health care provider. Make sure you discuss any questions you have with your health care provider. Document Released: 12/14/2000 Document Revised: 07/26/2016 Document Reviewed: 05/06/2014 Elsevier Interactive Patient Education  2019 [employer].

## 2018-03-26 NOTE — Progress Notes (Signed)
03/26/2018 2:16 PM  Pt refusing CT chest.  I discussed risks, including death and disability and benefits of work up recommended by Dr. Juanetta Gosling and patient refusing to be tested saying she is going home, her ride is ready and she will not consent to further treatments.  Discharging home.   Maryln Manuel MD

## 2018-03-26 NOTE — Progress Notes (Signed)
PT Cancellation Note  Patient Details Name: Amy Mcknight MRN: [id number] DOB: Feb 27, 1995   Cancelled Treatment:    Reason Eval/Treat Not Completed: Patient declined, no reason specified(pt declined PT treatment stating that she was sleeping and didn't feel good. When asked if she wanted PT to check back later today, she stated "yeah I guess")   Jac Canavan PT, DPT

## 2018-03-26 NOTE — Progress Notes (Signed)
CT of chest refused by patient that was ordered by Dr. Juanetta Gosling, Dr Juanetta Gosling was made aware of patient's refusal.  Dr. Juanetta Gosling stated to also notify Dr. Laural Benes, who was also notified. Dr. Laural Benes stated that he would be discharging the patient later today.

## 2018-03-26 NOTE — Progress Notes (Signed)
MD notified verbally that PT did not have a PICC any longer. The PICC was removed on 03/25/18 during the day with anticipation for discharge. IV abx not given

## 2018-03-26 NOTE — Consult Note (Signed)
Consult requested by: [facility] hospitalist, Dr. Laural Benes Consult requested for: Hemoptysis  HPI: This is a 23 year old who has significant history of IV drug abuse and who came to the hospital with lower back pain.  She had fever chills shortness of breath cough productive of brown sputum and chest wall pain.  She was not sure how high her fever had been.  She was found to have cavitary pneumonia thought to be related to septic emboli.  She had methicillin sensitive staph aureus sepsis and bacteremia she has now completed 6 weeks of IV antibiotics.  I have reviewed the CT of abdomen and pelvis and chest x-ray is personally.  Recent chest x-ray does not demonstrate cavitary pneumonia which was seen clearly on the CT abdomen and pelvis.  She is coughing up about a teaspoon of blood at a time.  There was concern earlier in her hospitalization that she might have DVT because she is been in the hospital for 6 weeks and has not been up much.  Past Medical History:  Diagnosis Date  . Substance use disorder   No known history of lung disease  Family History  Problem Relation Age of Onset  . Diabetes Mother   No known history of lung disease.  No known exposure to TB  Social History   Socioeconomic History  . Marital status: Single    Spouse name: Not on file  . Number of children: Not on file  . Years of education: Not on file  . Highest education level: Not on file  Occupational History  . Not on file  Social Needs  . Financial resource strain: Not on file  . Food insecurity:    Worry: Not on file    Inability: Not on file  . Transportation needs:    Medical: Not on file    Non-medical: Not on file  Tobacco Use  . Smoking status: Current Every Day Smoker    Packs/day: 0.50    Types: Cigarettes  . Smokeless tobacco: Never Used  Substance and Sexual Activity  . Alcohol use: Not Currently  . Drug use: Yes    Types: Heroin  . Sexual activity: Not on file  Lifestyle  . Physical  activity:    Days per week: Not on file    Minutes per session: Not on file  . Stress: Not on file  Relationships  . Social connections:    Talks on phone: Not on file    Gets together: Not on file    Attends religious service: Not on file    Active member of club or organization: Not on file    Attends meetings of clubs or organizations: Not on file    Relationship status: Not on file  Other Topics Concern  . Not on file  Social History Narrative  . Not on file     ROS: Except as mentioned 10 point review of systems is negative    Objective: Vital signs in last 24 hours: Temp:  [98.3 F (36.8 C)-99.1 F (37.3 C)] 98.3 F (36.8 C) (03/23 0506) Pulse Rate:  [85-107] 100 (03/23 0506) Resp:  [18] 18 (03/23 0506) BP: (100-124)/(69-92) 124/88 (03/23 0506) SpO2:  [84 %-99 %] 98 % (03/23 0752) Weight:  [75.1 kg] 75.1 kg (03/23 0506) Weight change: -0.1 kg Last BM Date: 03/25/18  Intake/Output from previous day: 03/22 0701 - 03/23 0700 In: 480 [P.O.:480] Out: -   PHYSICAL EXAM Constitutional: She is a thin female who is in no acute  distress but has recently coughed up some blood.  Eyes: Pupils react EOMI.  Ears nose mouth and throat: She does not have any blood in her pharynx.  Cardiovascular: Her heart is regular with a systolic heart murmur.  Respiratory: She has some fine rales scattered.  Gastrointestinal: Her abdomen soft with no masses.  Musculoskeletal: She appears generally weak but it is symmetrical.  Neurological: No focal abnormalities.  Psychiatric: She is anxious  Lab Results: Basic Metabolic Panel: No results for input(s): NA, K, CL, CO2, GLUCOSE, BUN, CREATININE, CALCIUM, MG, PHOS in the last 72 hours. Liver Function Tests: No results for input(s): AST, ALT, ALKPHOS, BILITOT, PROT, ALBUMIN in the last 72 hours. No results for input(s): LIPASE, AMYLASE in the last 72 hours. No results for input(s): AMMONIA in the last 72 hours. CBC: No results for input(s):  WBC, NEUTROABS, HGB, HCT, MCV, PLT in the last 72 hours. Cardiac Enzymes: No results for input(s): CKTOTAL, CKMB, CKMBINDEX, TROPONINI in the last 72 hours. BNP: No results for input(s): PROBNP in the last 72 hours. D-Dimer: No results for input(s): DDIMER in the last 72 hours. CBG: Recent Labs    03/24/18 2102 03/25/18 0734 03/25/18 1142 03/25/18 2111 03/26/18 0306 03/26/18 0739  GLUCAP 124* 108* 98 96 95 80   Hemoglobin A1C: No results for input(s): HGBA1C in the last 72 hours. Fasting Lipid Panel: No results for input(s): CHOL, HDL, LDLCALC, TRIG, CHOLHDL, LDLDIRECT in the last 72 hours. Thyroid Function Tests: No results for input(s): TSH, T4TOTAL, FREET4, T3FREE, THYROIDAB in the last 72 hours. Anemia Panel: No results for input(s): VITAMINB12, FOLATE, FERRITIN, TIBC, IRON, RETICCTPCT in the last 72 hours. Coagulation: No results for input(s): LABPROT, INR in the last 72 hours. Urine Drug Screen: Drugs of Abuse     Component Value Date/Time   LABOPIA POSITIVE (A) 03/14/2018 0636   COCAINSCRNUR NONE DETECTED 03/14/2018 0636   LABBENZ POSITIVE (A) 03/14/2018 0636   AMPHETMU NONE DETECTED 03/14/2018 0636   THCU NONE DETECTED 03/14/2018 0636   LABBARB NONE DETECTED 03/14/2018 0636    Alcohol Level: No results for input(s): ETH in the last 72 hours. Urinalysis: No results for input(s): COLORURINE, LABSPEC, PHURINE, GLUCOSEU, HGBUR, BILIRUBINUR, KETONESUR, PROTEINUR, UROBILINOGEN, NITRITE, LEUKOCYTESUR in the last 72 hours.  Invalid input(s): APPERANCEUR Misc. Labs:   ABGS: No results for input(s): PHART, PO2ART, TCO2, HCO3 in the last 72 hours.  Invalid input(s): PCO2   MICROBIOLOGY: No results found for this or any previous visit (from the past 240 hour(s)).  Studies/Results: Dg Chest Port 1 View  Result Date: 03/25/2018 CLINICAL DATA:  Hemoptysis. EXAM: PORTABLE CHEST 1 VIEW COMPARISON:  Radiograph 03/11/2018, lung bases 02/08/2018 FINDINGS: The heart is  enlarged. Bilateral pleural effusions that are similar to prior exam. Multifocal airspace opacities with some improvement from prior. Cavitary lesions on prior CT are not appreciated radiographically. No evidence of pulmonary edema. No pneumothorax. IMPRESSION: Cardiomegaly, similar to slightly progressed from radiographs 2 weeks ago. Moderate bilateral pleural effusions are similar. Improvement in bilateral patchy airspace opacities, patient with known cavitary pneumonia. Electronically Signed   By: Narda Rutherford M.D.   On: 03/25/2018 23:37    Medications:  Prior to Admission:  No medications prior to admission.   Scheduled: . buprenorphine-naloxone  1 tablet Sublingual BID  . DULoxetine  30 mg Oral BID  . feeding supplement  1 Container Oral BID BM  . ferrous sulfate  325 mg Oral Q breakfast  . furosemide  40 mg Oral BID  .  gabapentin  100 mg Oral TID  . ipratropium-albuterol  3 mL Nebulization Q6H WA  . magnesium oxide  400 mg Oral BID  . metoprolol tartrate  75 mg Oral BID  . multivitamin with minerals  1 tablet Oral Daily  . pantoprazole  40 mg Oral BID  . polyethylene glycol  17 g Oral BID  . senna-docusate  2 tablet Oral BID  . sodium chloride flush  10-40 mL Intracatheter Q12H   Continuous: . sodium chloride Stopped (03/19/18 1641)  . magnesium sulfate 1 - 4 g bolus IVPB     ZOX:WRUEAV chloride, acetaminophen **OR** acetaminophen, albuterol, guaiFENesin-dextromethorphan, ondansetron **OR** ondansetron (ZOFRAN) IV, pentafluoroprop-tetrafluoroeth, prochlorperazine, sodium chloride flush, traZODone  Assesment: She was admitted with methicillin sensitive staph aureus bacteremia and sepsis.  She has cavitary pneumonia from septic emboli.  She has finished 6 weeks of IV antibiotics and was approaching discharge but started having hemoptysis which is submassive.  There have been concerned that she might have DVT earlier but she refused work-up she tells me she will agree to CT of the  chest.  I think it is extremely unlikely that she has something like tuberculosis since we do have a diagnosis of septic emboli causing the cavitary pneumonia  She has history of IV drug abuse.  She had endocarditis she has been treated  She does smoke but I do not see definite evidence of COPD  She had acute kidney injury which is better  Principal Problem:   MSSA bacteremia Active Problems:   Cavitary pneumonia   AKI (acute kidney injury) (HCC)   Substance use disorder   Hyponatremia   IVDU (intravenous drug user)   Myositis   Normocytic anemia   Thrombocytopenia (HCC)   Cigarette smoker   Hepatitis C antibody test positive   Endocarditis of tricuspid valve   Protein-calorie malnutrition, severe   Septic embolism (HCC)   Septic shock (HCC)   Hypotension   Acute diastolic CHF (congestive heart failure) (HCC)   Bilateral leg edema    Plan: Check CT chest angiogram.  No indication for bronchoscopy at this point.    LOS: 46 days   Fredirick Maudlin 03/26/2018, 8:29 AM

## 2018-03-26 NOTE — Clinical Social Work Note (Addendum)
Patient declined a referral to a Suboxone clinic for maintenance. Patient declined any resources related to mental health counseling and substance abuse counseling. LCSW inquired about maintaining her soberity without professional supports. Patient stated angrily "I don't want to keep talking about it." LCSW stressed that hospital staff was only trying to help her and point out how her risk taking behaviors had lead her to this point.   LCSW signing off.

## 2018-03-26 NOTE — Discharge Summary (Addendum)
Physician Discharge Summary  Amy Mcknight WJX:[id number] DOB: Sep 26, 1995 DOA: 02/08/2018  Admit date: 02/08/2018 Discharge date: 03/26/2018  PT IS BEING DISCHARGED AS SHE IS REFUSING FURTHER TREATMENTS AND WORK UP IN HOSPITAL   Disposition: Home with family for 24/7 supervision  Recommendations for Outpatient  1. Follow up with PCP in 1 weeks 2. Follow up with ID clinic in 2 weeks.  3. Follow up with cardiology in 2-4 weeks. 4. Follow up with pulmonary in 2 weeks.  5. Follow up with GI in 1 month.  6. Enroll into a drug rehabilitation program.   PT IS HIGH RISK FOR READMISSION AND ADVERSE OUTCOME DUE TO NONCOMPLIANCE AND REFUSAL TO ACCEPT MEDICAL CARE AND TREATMENTS.   Discharge Condition: STABLE   CODE STATUS: FULL    Brief Hospitalization Summary: Please see all hospital notes, images, labs for full details of this prolonged hospitalization. Dr. Eliane Decree HPI: Amy Mcknight is a 23 y.o. female with medical history significant for substance use disorder who presents to the ED with 2 days of right lower back pain.  Patient states symptoms began with acute onset while at rest.  Symptoms have been persistent.  She has noted associated diaphoresis, chills, shortness of breath, cough productive of brown sputum, and right chest wall pain.  She denies any rashes or obvious skin changes.  She reports good urine output without dysuria.  She denies any abdominal pain, diarrhea, or constipation.  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  She continues to use recreational drugs by snorting nasally.  She denies any alcohol use.  She reports a history of withdrawal from opiates in the past.  ED Course:  Initial vitals showed BP 84/61, pulse 114, RR 18, temp 97 Fahrenheit, SPO2 100% on room air.  Labs are notable for WBC 26.6, hemoglobin 10.2, platelets 145, sodium 127, potassium 3.7, BUN 41, creatinine 2.41, lactic acid 2.1, negative point-of-care urine pregnancy test.  UDS was positive  for opiates, cocaine, benzodiazepines, THC.  Urinalysis was negative for nitrites, positive for moderate leukocytes, with rare bacteria on microscopy.  Portable chest x-ray showed patchy bilateral opacities with central lucencies suggestive of cavitary masses/infection.  CT abdomen/pelvis without contrast showed multiple areas of cavitary pneumonia with possible developing lobe abscess in the right lower lobe and areas of consolidation elsewhere possibly associated with septic emboli.  Calcification of the left renal and inferior left calyx was noted suggestive of developing staghorn calculus.  MRI lumbar spine without contrast showed minimal disc bulges at L4-5 and L5-S1 without stenosis or neural compression.  Paraspinous muscle edema seen in the lower lumbar region.  Patient was given 1.75 L normal saline and started on IV vancomycin and meropenem.  The hospitalist service was consulted to admit for further evaluation and management.  _____________________________________________________________________________ Brief Narrative   23 year old female with history of substance use disorder presented to the ED with 2-day history of right lower back pain of acute onset with associated diaphoresis, chills, shortness of breath, productive cough and right chest wall pain.  She has active heroin use last use 1 month prior to admission, regular recreational drug use (snorting nasally), reports history of opiate withdrawal. Urine drug screen was positive for marijuana and opiates. 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  Requiring hospital stay for 6 weeks of antibiotic therapy.  Hospital course complicated with development of acute diastolic CHF and treatment noncompliance.  The patient has repeatedly refused medications,  testing, labs and therapies.  The risks were discussed with her and she verbalized  understanding.  She is being discharged after completing her 6 weeks of IV antibiotics but she is not allowing any further testing or treatments to be done and therefore will discharge her home.  She is HIGH RISK for readmission and adverse outcomes due to her noncompliance and refusal to allow testing, labs, meds, etc.  Pt has been abusive to staff on multiple occasions.    Assessment  & Plan :   MSSA bacteremia Initial presentation was sepsis with septic shock.  Has tricuspid endocarditis, paravertebral myositis and cavitary pneumonia. Sepsis has resolved.  Case was discussed with ID who recommended inpatient IV antibiotic with Ancef for total 6 weeks duration until 3/23.  Repeated blood cultures have been negative.  Acute diastolic CHF. Gained 14 kg since admission.  Attempting to diurese with Lasix but intermittently refusing meds, getting I's and O's and regular vitals.     Echo shows severe TR with LVEF of 60-65%. Counseled again on importance of monitoring her weight, I's and O's and taking her meds.. Continue p.o. Lasix 40 mg Daily.  PT HAS REFUSED TO TAKE LASIX REGULARLY AND LIKELY WILL NOT TAKE IT AFTER DISCHARGE BUT SHE HAS BEEN GIVEN INSTRUCTIONS AND PRESCRIPTIONS AND CARDIOLOGY FOLLOW UP.  Cardiology consult appreciated.  Severe sepsis and cavitary pneumonia. Metastatic from MSSA bacteremia/endocarditis.  SHE HAS BEEN FULLY TREATED WITH 6 WEEKS OF IV CEFAZOLIN AND PICC LINE HAS BEEN REMOVED.  SHE CAN FOLLOW UP WITH OUTPATIENT ID CLINIC FOR RECHECK.  FOLLOW UP WITH DR Juanetta Gosling PULMONOLOGY OUTPATIENT.   Leukocytosis WBC trended down. Remains afebrile.  Hypokalemia Replenished.  Acute kidney injury - RESOLVED Secondary to sepsis.  Serum creatinine peaked at 2.61.  Now resolved.  Positive hepatitis C antibody Quantitative viral load undetectable.  PT REMAINS HIGH RISK FOR RE-INFECTION.  THIS WAS TOLD TO HER AND SHE VERBALIZED UNDERSTANDING.   Polysubstance abuse  Pt was  treated with suboxone while inpatient, she was given information about getting enrolled in an outpatient suboxone clinic but she does not seem interested and expressed that she is likely to use recreational opioids again.  UPDATE: SOCIAL WORKER REPORTING THAT PATIENT REFUSING SUBOXONE CLINIC REFERRAL.   Generalized weakness and physical debility  Intermittent PT.  Patient refusing to get out of bed or ambulate.  Anemia of chronic disease Stable.  Received 2 unit PRBC on 2/12.  No intervention per GI.  Continue PPI.  Received Feraheme this admission.  SHE WAS GIVEN PRESCRIPTION FOR IRON SUPPLEMENT.   Bilateral lower extremity edema Patient has refused lower extremity Dopplers on more than one occasion.  Pt refusing lasix and only takes intermittently.   Anxiety and depression Continue Cymbalta.  Hemoptysis - Pt has had several blood tinged sputum.  She was seen by pulmonologist and he did not feel that she needed bronchoscopy at this time.  Dr.  Juanetta Gosling recommended a CTA chest and patient initially agreed to test but then refused to have it done.  I counseled with her and explained the risks, including death and she verbalized understanding and still refused to have the test done.   Acute respiratory failure with hypoxia.  RESOLVED.  Pt was treated with supplemental oxygen that has been weaned down to room air.  Pt also refused to have CTA chest done on 3/23 after multiple attempts.   Acute encephalopathy Mental status is now improved after adjusting medications.   Code Status : Full code  Disposition Plan  : Home with family for 24/7 supervision  Consults  : GI, cardiology, ID  Procedures  : 2D echo, MRI lumbar spine  DVT Prophylaxis  : SCDs  Discharge Diagnoses:  Principal Problem:   MSSA bacteremia Active Problems:   Cavitary pneumonia   AKI (acute kidney injury) (HCC)   Substance use disorder   Hyponatremia   IVDU (intravenous drug user)   Myositis    Normocytic anemia   Thrombocytopenia (HCC)   Cigarette smoker   Hepatitis C antibody test positive   Endocarditis of tricuspid valve   Protein-calorie malnutrition, severe   Septic embolism (HCC)   Septic shock (HCC)   Hypotension   Acute diastolic CHF (congestive heart failure) (HCC)   Bilateral leg edema   Discharge Instructions: Discharge Instructions    Call MD for:  difficulty breathing, headache or visual disturbances   Complete by:  As directed    Call MD for:  extreme fatigue   Complete by:  As directed    Call MD for:  persistant dizziness or light-headedness   Complete by:  As directed    Call MD for:  persistant nausea and vomiting   Complete by:  As directed    Increase activity slowly   Complete by:  As directed      Allergies as of 03/26/2018      Reactions   Penicillins Other (See Comments)   Diaper rash as an infant per mother      Medication List    TAKE these medications   DULoxetine 30 MG capsule Commonly known as:  CYMBALTA Take 1 capsule (30 mg total) by mouth 2 (two) times daily for 30 days.   ferrous sulfate 325 (65 FE) MG tablet Take 1 tablet (325 mg total) by mouth daily with breakfast for 30 days. Start taking on:  March 27, 2018   furosemide 40 MG tablet Commonly known as:  LASIX Take 1 tablet (40 mg total) by mouth daily for 30 days.   gabapentin 100 MG capsule Commonly known as:  NEURONTIN Take 1 capsule (100 mg total) by mouth 3 (three) times daily for 30 days.   Metoprolol Tartrate 75 MG Tabs Take 75 mg by mouth 2 (two) times daily for 30 days.   multivitamin with minerals Tabs tablet Take 1 tablet by mouth daily. Start taking on:  March 27, 2018   pantoprazole 40 MG tablet Commonly known as:  PROTONIX Take 1 tablet (40 mg total) by mouth daily for 30 days.   polyethylene glycol packet Commonly known as:  MIRALAX / GLYCOLAX Take 17 g by mouth daily as needed.   potassium chloride 10 MEQ tablet Commonly known as:   K-DUR Take 1 tablet (10 mEq total) by mouth daily for 30 days.      Follow-up Information    Antoine Poche, MD. Schedule an appointment as soon as possible for a visit on 04/26/2018.   Specialty:  Cardiology Why:  hospital Follow Up  10:40 in the [facility] information: [address] [place] [address] [phone]        Cliffton Asters, MD. Schedule an appointment as soon as possible for a visit in 2 weeks.   Specialty:  Infectious Diseases Why:  Hospital Follow Up  Contact information: [address] [place] [address] [phone]        Health, [facility]. Schedule an appointment as soon as possible for a visit in 5 days.  Why:  Hospital Follow Up  Contact information: [address] [place] [address] [phone]        Malissa Hippo, MD. Schedule an appointment as soon as possible for a visit on 04/25/2018.   Specialty:  Gastroenterology Why:  Hospital Follow Up 3:30 Contact information: [address] [place] [address] [phone]        Kari Baars, MD. Schedule an appointment as soon as possible for a visit on 04/10/2018.   Specialty:  Pulmonary Disease Why:  Hospital Follow Up hemoptysis 12:00 Contact information: [address] [place] [address] [phone]          Allergies  Allergen Reactions  . Penicillins Other (See Comments)    Diaper rash as an infant per mother   Allergies as of 03/26/2018      Reactions   Penicillins Other (See Comments)   Diaper rash as an infant per mother      Medication List    TAKE these medications   DULoxetine 30 MG capsule Commonly known as:  CYMBALTA Take 1 capsule (30 mg total) by mouth 2 (two) times daily for 30 days.   ferrous sulfate 325 (65 FE) MG tablet Take 1 tablet (325 mg total) by mouth daily with breakfast for 30 days. Start taking on:  March 27, 2018   furosemide 40 MG tablet Commonly known as:   LASIX Take 1 tablet (40 mg total) by mouth daily for 30 days.   gabapentin 100 MG capsule Commonly known as:  NEURONTIN Take 1 capsule (100 mg total) by mouth 3 (three) times daily for 30 days.   Metoprolol Tartrate 75 MG Tabs Take 75 mg by mouth 2 (two) times daily for 30 days.   multivitamin with minerals Tabs tablet Take 1 tablet by mouth daily. Start taking on:  March 27, 2018   pantoprazole 40 MG tablet Commonly known as:  PROTONIX Take 1 tablet (40 mg total) by mouth daily for 30 days.   polyethylene glycol packet Commonly known as:  MIRALAX / GLYCOLAX Take 17 g by mouth daily as needed.   potassium chloride 10 MEQ tablet Commonly known as:  K-DUR Take 1 tablet (10 mEq total) by mouth daily for 30 days.     Procedures/Studies: Dg Chest 2 View  Result Date: 03/11/2018 CLINICAL DATA:  Bacteremia.  Drug abuse. EXAM: CHEST - 2 VIEW COMPARISON:  March 01, 2018 FINDINGS: The right PICC line terminates in the SVC, unchanged. Bilateral patchy pulmonary infiltrates persist with moderate bilateral pleural effusions, stable. The cardiomediastinal silhouette is unchanged. IMPRESSION: Persistent bilateral pleural effusions and patchy bilateral pulmonary infiltrates. Electronically Signed   By: Gerome Sam III M.D   On: 03/11/2018 15:54   Dg Chest 2 View  Result Date: 02/24/2018 CLINICAL DATA:  Short of breath EXAM: CHEST - 2 VIEW COMPARISON:  02/20/2018 FINDINGS: Upper normal heart size. Stable left pleural effusion. Tiny right pleural effusion is stable. Patchy airspace opacities throughout both lungs are not significantly changed. No pneumothorax. IMPRESSION: Bilateral airspace disease and bilateral pleural effusions left greater than right are stable. Electronically Signed   By: Jolaine Click M.D.   On: 02/24/2018 15:50   Mr Lumbar Spine W Wo Contrast  Result Date: 02/26/2018 CLINICAL DATA:  Low back pain since prior abnormal MRI 02/08/2018. Unable to walk. EXAM: MRI LUMBAR  SPINE WITHOUT AND WITH CONTRAST TECHNIQUE: Multiplanar and multiecho pulse sequences of the lumbar spine were obtained without and with intravenous contrast. CONTRAST:  7 mL Gadavist COMPARISON:  02/08/2018 FINDINGS: Segmentation:  Standard. Alignment:  Physiologic. Vertebrae:  No fracture, evidence of discitis, or bone lesion. Conus medullaris and cauda equina: Conus extends to the L1 level. Conus and cauda equina appear normal. Paraspinal and other soft tissues: Posterior paraspinal muscle edema bilaterally with mild enhancement on postcontrast imaging. No intramuscular fluid collection or hematoma. Disc levels: Disc spaces: Disc spaces are maintained. T12-L1: No significant disc bulge. No evidence of neural foraminal stenosis. No central canal stenosis. L1-L2: No significant disc bulge. No evidence of neural foraminal stenosis. No central canal stenosis. L2-L3: No significant disc bulge. No evidence of neural foraminal stenosis. No central canal stenosis. L3-L4: No significant disc bulge. No evidence of neural foraminal stenosis. No central canal stenosis. L4-L5: Minimal broad-based disc bulge. No evidence of neural foraminal stenosis. No central canal stenosis. L5-S1: Minimal broad-based disc bulge. No evidence of neural foraminal stenosis. No central canal stenosis. IMPRESSION: 1. No epidural fluid collection.  No discitis or osteomyelitis. 2. Posterior paraspinal muscle edema bilaterally with mild enhancement on postcontrast imaging. No intramuscular fluid collection or hematoma. Differential considerations include muscle strain versus mild myositis. Electronically Signed   By: Elige KoHetal  Patel   On: 02/26/2018 09:53   Dg Chest Port 1 View  Result Date: 03/25/2018 CLINICAL DATA:  Hemoptysis. EXAM: PORTABLE CHEST 1 VIEW COMPARISON:  Radiograph 03/11/2018, lung bases 02/08/2018 FINDINGS: The heart is enlarged. Bilateral pleural effusions that are similar to prior exam. Multifocal airspace opacities with some  improvement from prior. Cavitary lesions on prior CT are not appreciated radiographically. No evidence of pulmonary edema. No pneumothorax. IMPRESSION: Cardiomegaly, similar to slightly progressed from radiographs 2 weeks ago. Moderate bilateral pleural effusions are similar. Improvement in bilateral patchy airspace opacities, patient with known cavitary pneumonia. Electronically Signed   By: Narda RutherfordMelanie  Sanford M.D.   On: 03/25/2018 23:37   Dg Chest Port 1 View  Result Date: 03/01/2018 CLINICAL DATA:  Shortness of breath EXAM: PORTABLE CHEST 1 VIEW COMPARISON:  02/24/2018 FINDINGS: Cardiomegaly. Moderate to large bilateral pleural effusions, stable on the left, increasing on the right since prior study. Diffuse bilateral airspace disease has worsened since prior study and slightly more pronounced on the right. This could reflect asymmetric edema or pneumonia. IMPRESSION: Moderate to large bilateral pleural effusions, increasing on the right since prior study. Diffuse bilateral airspace disease, right greater than left, worsening since prior study. This could reflect asymmetric edema or pneumonia. Electronically Signed   By: Charlett NoseKevin  Dover M.D.   On: 03/01/2018 18:14   [place] Ekg Site Rite  Result Date: 02/26/2018 If Site Rite image not attached, placement could not be confirmed due to current cardiac rhythm.    Subjective: Pt refusing to have CT scan done today.  Pt says she is going home and to discharge her now.  She denies any specific complaints.  She is refusing medications and treatments.  She refused PT, refused several medications.    Discharge Exam: Vitals:   03/26/18 0752 03/26/18 1423  BP:    Pulse:    Resp:    Temp:    SpO2: 98% 92%   Vitals:   03/26/18 0102 03/26/18 0506 03/26/18 0752 03/26/18 1423  BP:  124/88    Pulse:  100    Resp:  18    Temp:  98.3 F (36.8 C)    TempSrc:  Axillary    SpO2: 98% 98% 98% 92%  Weight:  75.1 kg    Height:  General exam: Alert, awake,  oriented x 3, not cooperative for exam.  Respiratory system: Clear to auscultation. Respiratory effort normal. Cardiovascular system: normal s1,s2 sounds.  No murmurs, rubs, gallops. Gastrointestinal system: Abdomen is nondistended, soft and nontender. No organomegaly or masses felt. Normal bowel sounds heard. Central nervous system: Alert and oriented. No focal neurological deficits. Extremities: 1+ edema bilaterally - unchanged Skin: No rashes, lesions or ulcers Psychiatry: Judgement and insight appear poor. Mood & affect flat.     The results of significant diagnostics from this hospitalization (including imaging, microbiology, ancillary and laboratory) are listed below for reference.     Microbiology: No results found for this or any previous visit (from the past 240 hour(s)).   Labs: BNP (last 3 results) Recent Labs    03/03/18 0716  BNP 614.0*   Basic Metabolic Panel: Recent Labs  Lab 03/20/18 0507 03/23/18 0529  NA 136 135  K 3.9 4.3  CL 97* 97*  CO2 30 28  GLUCOSE 98 135*  BUN 19 15  CREATININE 0.59 0.61  CALCIUM 8.3* 8.2*  MG 1.6*  --    Liver Function Tests: Recent Labs  Lab 03/20/18 0507 03/23/18 0529  AST 98* 41  ALT 17 7  ALKPHOS 123 126  BILITOT 0.4 0.5  PROT 7.0 7.1  ALBUMIN 2.1* 2.2*   No results for input(s): LIPASE, AMYLASE in the last 168 hours. No results for input(s): AMMONIA in the last 168 hours. CBC: Recent Labs  Lab 03/20/18 0507 03/23/18 0529  WBC 9.8 13.6*  HGB 10.0* 10.1*  HCT 35.5* 34.7*  MCV 96.2 95.9  PLT 249 227   Cardiac Enzymes: No results for input(s): CKTOTAL, CKMB, CKMBINDEX, TROPONINI in the last 168 hours. BNP: Invalid input(s): POCBNP CBG: Recent Labs  Lab 03/25/18 1142 03/25/18 2111 03/26/18 0306 03/26/18 0739 03/26/18 1119  GLUCAP 98 96 95 80 95   D-Dimer No results for input(s): DDIMER in the last 72 hours. Hgb A1c No results for input(s): HGBA1C in the last 72 hours. Lipid Profile No results  for input(s): CHOL, HDL, LDLCALC, TRIG, CHOLHDL, LDLDIRECT in the last 72 hours. Thyroid function studies No results for input(s): TSH, T4TOTAL, T3FREE, THYROIDAB in the last 72 hours.  Invalid input(s): FREET3 Anemia work up No results for input(s): VITAMINB12, FOLATE, FERRITIN, TIBC, IRON, RETICCTPCT in the last 72 hours. Urinalysis    Component Value Date/Time   COLORURINE YELLOW 02/24/2018 1845   APPEARANCEUR HAZY (A) 02/24/2018 1845   LABSPEC 1.020 02/24/2018 1845   PHURINE 5.0 02/24/2018 1845   GLUCOSEU NEGATIVE 02/24/2018 1845   HGBUR MODERATE (A) 02/24/2018 1845   BILIRUBINUR NEGATIVE 02/24/2018 1845   KETONESUR NEGATIVE 02/24/2018 1845   PROTEINUR 30 (A) 02/24/2018 1845   NITRITE NEGATIVE 02/24/2018 1845   LEUKOCYTESUR SMALL (A) 02/24/2018 1845   Sepsis Labs Invalid input(s): PROCALCITONIN,  WBC,  LACTICIDVEN Microbiology No results found for this or any previous visit (from the past 240 hour(s)).  Time coordinating discharge: 40 minutes   SIGNED:  Standley Dakins, MD  [facility] 03/26/2018, 2:53 PM How to contact the [facility] Attending or Consulting provider 7A - 7P or covering provider during after hours 7P -7A, for this patient?  1. Check the care team in [facility] and look for a) attending/consulting [facility] provider listed and b) the [facility] team listed 2. Log into [email or web address] and use [place]'s universal password to access. If you do not have the password, please contact the hospital operator. 3. Locate the [facility] provider you  are looking for under [facility] and page to a number that you can be directly reached. 4. If you still have difficulty reaching the provider, please page the [facility] (Director on Call) for the Hospitalists listed on amion for assistance.

## 2018-04-19 ENCOUNTER — Telehealth: Payer: Self-pay | Admitting: *Deleted

## 2018-04-19 NOTE — Telephone Encounter (Signed)
Spoke with pt mother who says pt is in [place] and currently admitted in hospital there asked [place] to cancel this appt. Will call us back if pt comes back to the area and services are needed.

## 2018-04-25 ENCOUNTER — Ambulatory Visit (INDEPENDENT_AMBULATORY_CARE_PROVIDER_SITE_OTHER): Payer: Self-pay | Admitting: Internal Medicine

## 2018-04-26 ENCOUNTER — Ambulatory Visit: Payer: Self-pay | Admitting: Cardiology

## 2020-07-03 IMAGING — CT CT ABD-PELV W/O
2 of 4 series · 15 of 46 positions shown, 17 images · non-contrast
Comparison: None.

CLINICAL DATA: Abdominal pain and fever

EXAM:
CT ABDOMEN AND PELVIS WITHOUT CONTRAST
TECHNIQUE: Multidetector CT imaging of the abdomen and pelvis was performed
following the standard protocol without oral or IV contrast.

[Series 2: axial st · axial · 0.61mm/px · z∈[+617,+992]mm · 12 of 89 slices shown, 14 images]
[im 7/89  soft-tissue]
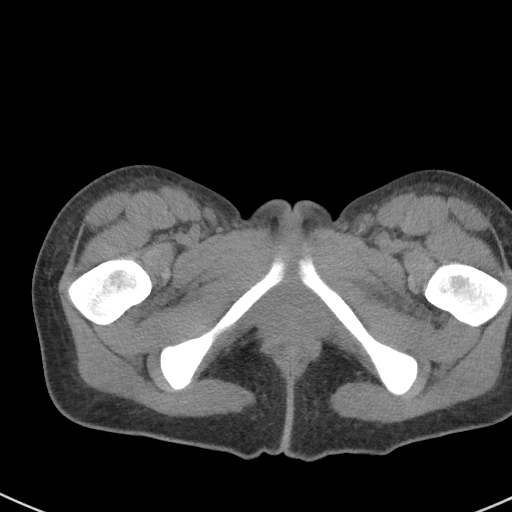
[im 7/89  bone]
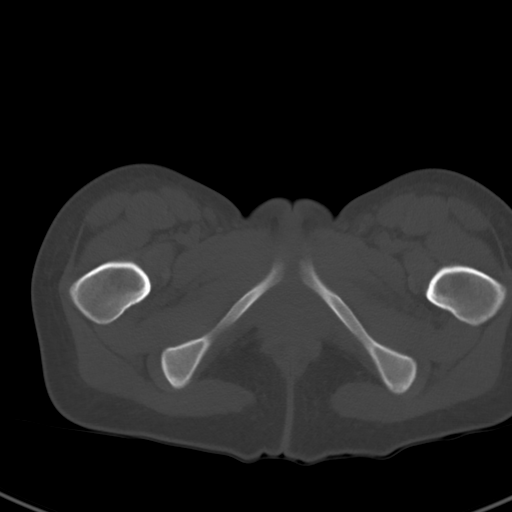
[im 14/89  soft-tissue]
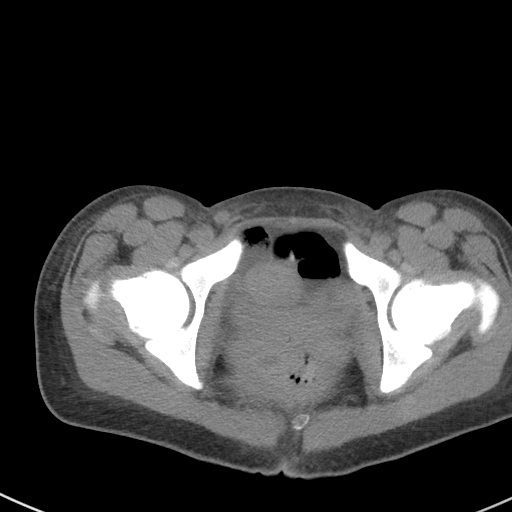
[im 21/89  soft-tissue]
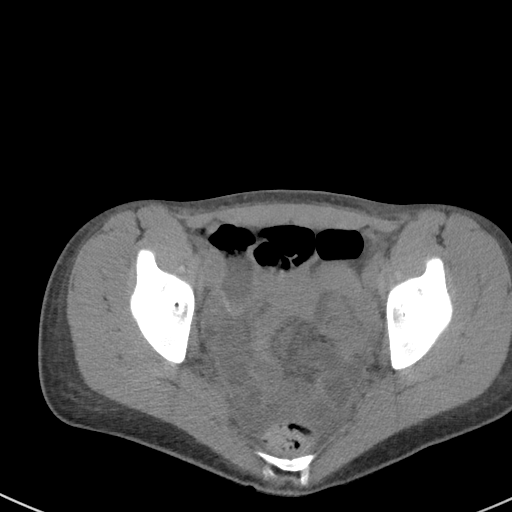
[im 28/89  soft-tissue]
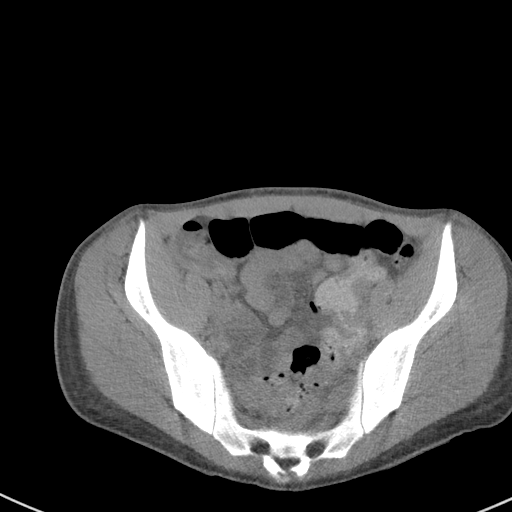
[im 34/89  soft-tissue]
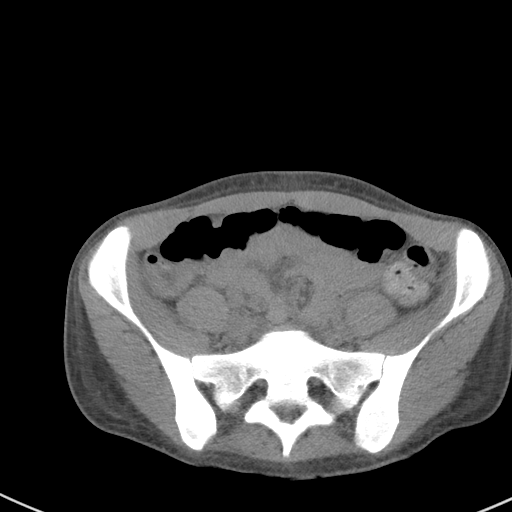
[im 41/89  soft-tissue]
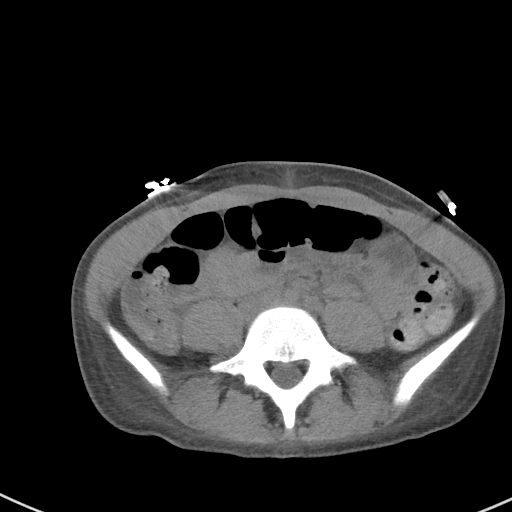
[im 48/89  soft-tissue]
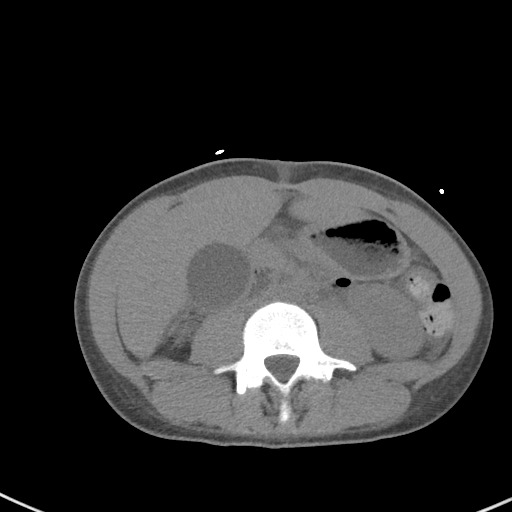
[im 55/89  soft-tissue]
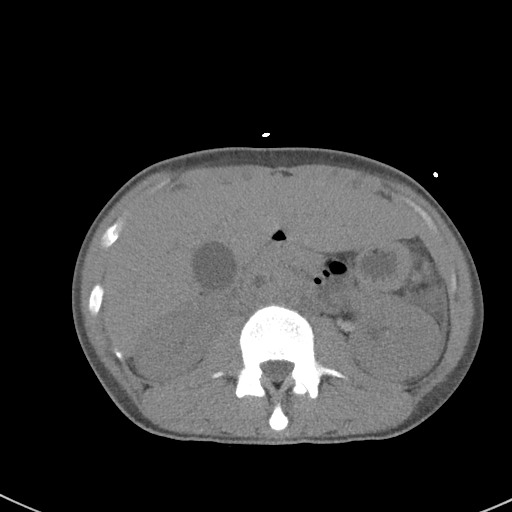
[im 61/89  soft-tissue]
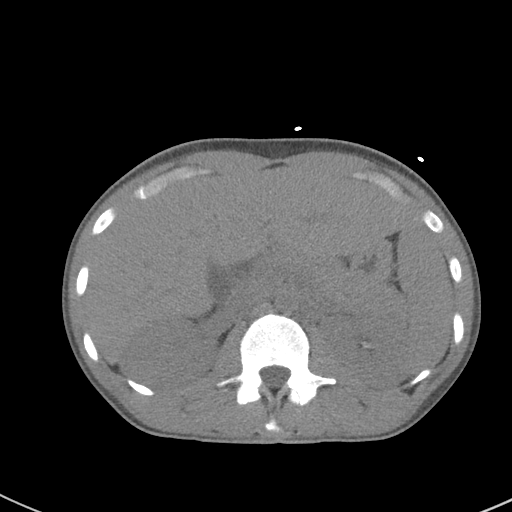
[im 61/89  bone]
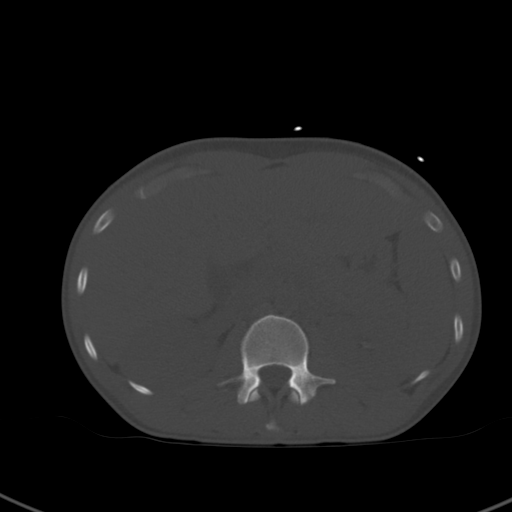
[im 68/89  soft-tissue]
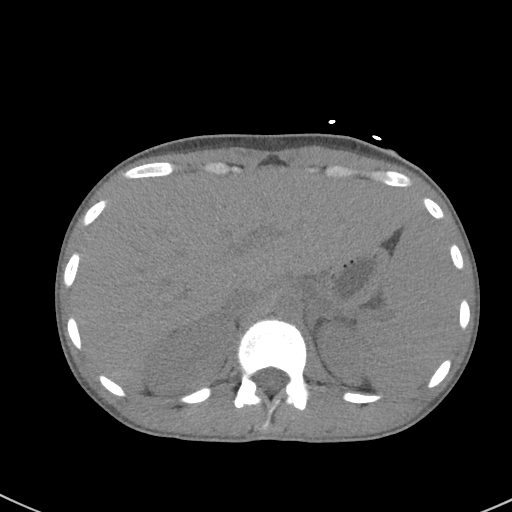
[im 75/89  soft-tissue]
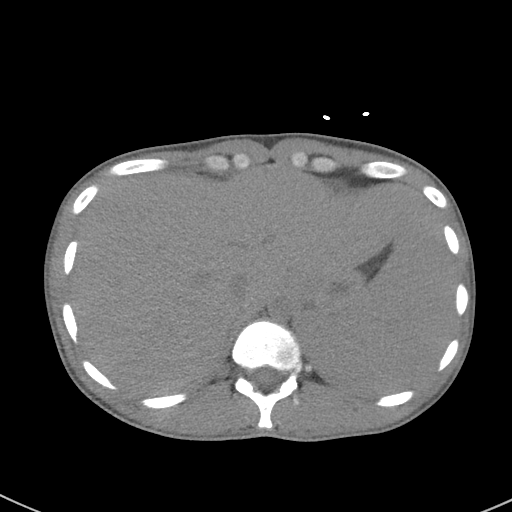
[im 82/89  soft-tissue]
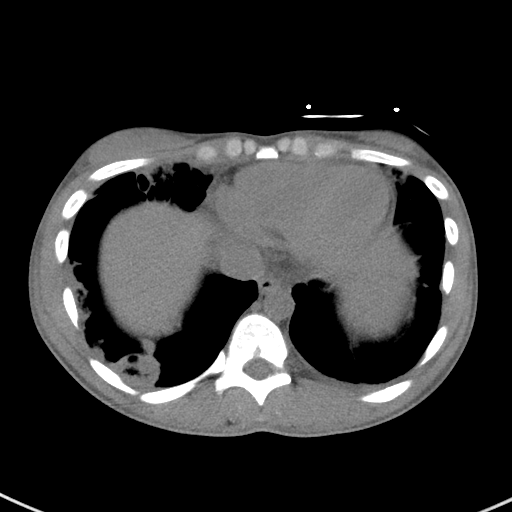

[Series 5: coronal st · coronal · 0.68mm/px · 3 of 83 slices shown]
[im 28/83  soft-tissue]
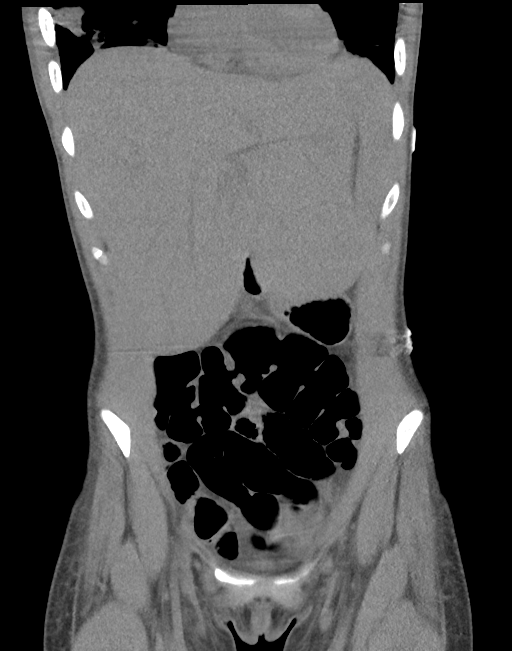
[im 37/83  soft-tissue]
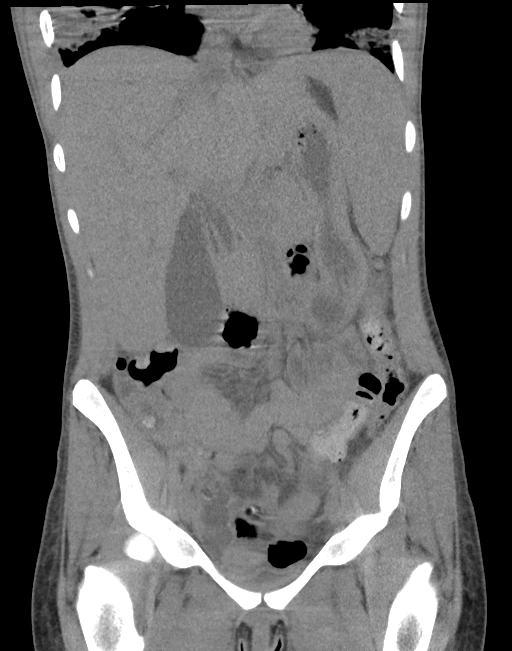
[im 46/83  soft-tissue]
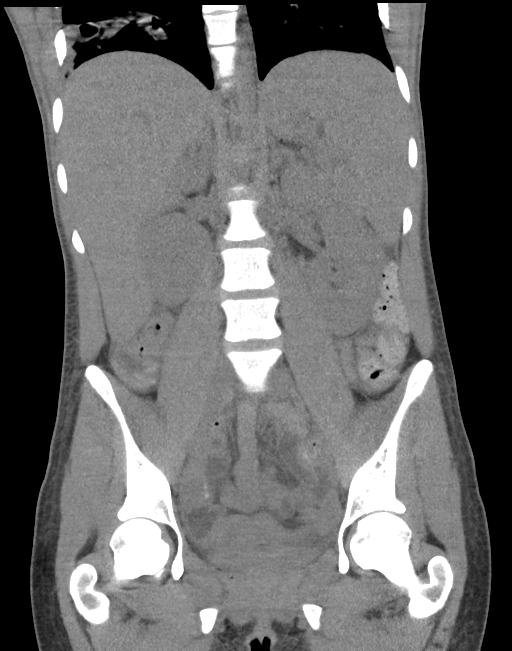

[15 of 46 positions shown; findings below may reference images not displayed]

FINDINGS: Lower chest: There is airspace consolidation throughout the lung
bases with several areas of cavitation, likely developing lung
abscesses. Several nodular opacities are noted associated with these
areas of cavitary consolidation, likely septic emboli.

Hepatobiliary: Liver measures 20.5 cm in length. No focal liver
lesions are appreciable on this noncontrast enhanced study. The
gallbladder is borderline dilated without wall thickening. There is
no biliary duct dilatation.

Pancreas: No pancreatic mass or inflammatory focus.

Spleen: Spleen measures 14.1 x 10.9 x 5.1 cm with a measured splenic
volume of 392 cubic cm. No focal splenic lesions are evident.

Adrenals/Urinary Tract: Adrenals appear unremarkable bilaterally.
There is mild nephrocalcinosis bilaterally. No renal mass evident.
No hydronephrosis on either side. There is a suspected developing
staghorn calculus on the left with somewhat amorphous calcification
involving a lower pole calyx on the left extending into the left
renal pelvis, best appreciated on coronal imaging. This developing
staghorn calculus measures 3.7 x 1.0 cm. No ureteral calculi are
evident. Urinary bladder is decompressed. No urinary bladder wall
thickening is appreciable with essentially empty bladder.

Stomach/Bowel: There is fluid throughout most small bowel loops.
There is no appreciable bowel wall or mesenteric thickening. No
evident bowel obstruction. No free air or portal venous air is
evident.

Vascular/Lymphatic: There is no abdominal aortic aneurysm. No
vascular lesions are evident on this noncontrast enhanced study.
Note that the attenuation throughout the vascular structures appear
slightly diminished which raises question of anemia. No adenopathy
is appreciable in abdomen or pelvis.

Reproductive: Uterus is anteverted. No pelvic masses evident. There
is a slight degree of free fluid in the cul-de-sac region.

Other: Appendix is not well seen. There is no periappendiceal region
inflammation on this study. No abscess is seen in the abdomen or
pelvis. There is no ascites beyond the slight fluid noted in the
cul-de-sac region.

Musculoskeletal: There are no blastic or lytic bone lesions. No
intramuscular or abdominal wall lesions are appreciable.
IMPRESSION: Comment: Note that paucity of fat makes assessment in the abdomen
and pelvis somewhat less than optimal.

1. Areas of cavitary pneumonia, likely with developing lung
abscesses, in the right lower lobe. Areas of consolidation elsewhere
with probable septic emboli associated.

2. Prominent liver and spleen. No focal liver and splenic lesions
evident.

3. There is evidence of a degree of nephrocalcinosis. Amorphous
calcification in a portion of the left renal pelvis and an inferior
left calyx suggest developing staghorn calculus. No hydronephrosis
on either side. No well-defined ureteral calculi.

4. Fluid in most loops of small bowel. Suspect a degree of ileus or
enteritis. No bowel obstruction. No abscess evident in the abdomen
or pelvis.

5. Small amount of free fluid in the cul-de-sac may be upper
physiologic.
